# Patient Record
Sex: Male | Born: 1955 | Race: Black or African American | Hispanic: No | Smoking: Current every day smoker
Health system: Southern US, Community
[De-identification: ages and names within clinical notes are randomized; demographics above are authoritative.]

## PROBLEM LIST (undated history)

## (undated) DIAGNOSIS — R51 Headache: Secondary | ICD-10-CM

## (undated) DIAGNOSIS — F419 Anxiety disorder, unspecified: Secondary | ICD-10-CM

## (undated) DIAGNOSIS — F329 Major depressive disorder, single episode, unspecified: Secondary | ICD-10-CM

## (undated) DIAGNOSIS — F32A Depression, unspecified: Secondary | ICD-10-CM

## (undated) DIAGNOSIS — R0602 Shortness of breath: Secondary | ICD-10-CM

## (undated) DIAGNOSIS — I639 Cerebral infarction, unspecified: Secondary | ICD-10-CM

## (undated) DIAGNOSIS — I10 Essential (primary) hypertension: Secondary | ICD-10-CM

## (undated) DIAGNOSIS — I509 Heart failure, unspecified: Secondary | ICD-10-CM

## (undated) DIAGNOSIS — J449 Chronic obstructive pulmonary disease, unspecified: Secondary | ICD-10-CM

## (undated) HISTORY — PX: EYE SURGERY: SHX253

---

## 2009-02-09 ENCOUNTER — Emergency Department (HOSPITAL_COMMUNITY): Admission: EM | Admit: 2009-02-09 | Discharge: 2009-02-09 | Payer: Self-pay | Admitting: Emergency Medicine

## 2009-03-15 ENCOUNTER — Emergency Department (HOSPITAL_COMMUNITY): Admission: EM | Admit: 2009-03-15 | Discharge: 2009-03-15 | Payer: Self-pay | Admitting: Emergency Medicine

## 2009-07-06 ENCOUNTER — Encounter: Payer: Self-pay | Admitting: Internal Medicine

## 2009-07-30 ENCOUNTER — Ambulatory Visit: Payer: Self-pay | Admitting: Internal Medicine

## 2009-07-30 DIAGNOSIS — I10 Essential (primary) hypertension: Secondary | ICD-10-CM | POA: Insufficient documentation

## 2009-07-30 DIAGNOSIS — E785 Hyperlipidemia, unspecified: Secondary | ICD-10-CM | POA: Insufficient documentation

## 2009-07-30 DIAGNOSIS — I6350 Cerebral infarction due to unspecified occlusion or stenosis of unspecified cerebral artery: Secondary | ICD-10-CM | POA: Insufficient documentation

## 2009-07-30 DIAGNOSIS — I509 Heart failure, unspecified: Secondary | ICD-10-CM | POA: Insufficient documentation

## 2009-07-30 DIAGNOSIS — R0602 Shortness of breath: Secondary | ICD-10-CM

## 2009-07-30 DIAGNOSIS — J309 Allergic rhinitis, unspecified: Secondary | ICD-10-CM | POA: Insufficient documentation

## 2009-07-30 DIAGNOSIS — E119 Type 2 diabetes mellitus without complications: Secondary | ICD-10-CM

## 2009-08-13 ENCOUNTER — Ambulatory Visit: Payer: Self-pay | Admitting: Pulmonary Disease

## 2009-08-13 DIAGNOSIS — J438 Other emphysema: Secondary | ICD-10-CM

## 2011-01-18 LAB — POCT I-STAT, CHEM 8
BUN: 20 mg/dL (ref 6–23)
Chloride: 105 mEq/L (ref 96–112)
Glucose, Bld: 104 mg/dL — ABNORMAL HIGH (ref 70–99)
HCT: 47 % (ref 39.0–52.0)
Potassium: 3.7 mEq/L (ref 3.5–5.1)

## 2011-01-18 LAB — URINALYSIS, ROUTINE W REFLEX MICROSCOPIC
Ketones, ur: NEGATIVE mg/dL
Nitrite: NEGATIVE
pH: 5 (ref 5.0–8.0)

## 2011-01-18 LAB — URINE MICROSCOPIC-ADD ON

## 2011-01-18 LAB — PROTIME-INR
INR: 1.8 — ABNORMAL HIGH (ref 0.00–1.49)
Prothrombin Time: 22.2 seconds — ABNORMAL HIGH (ref 11.6–15.2)

## 2011-08-03 ENCOUNTER — Other Ambulatory Visit: Payer: Self-pay | Admitting: Family Medicine

## 2011-08-03 ENCOUNTER — Ambulatory Visit
Admission: RE | Admit: 2011-08-03 | Discharge: 2011-08-03 | Disposition: A | Discharge status: Home / Self Care | Payer: Medicare Other | Source: Ambulatory Visit | Attending: Family Medicine | Admitting: Family Medicine

## 2011-08-03 DIAGNOSIS — R05 Cough: Secondary | ICD-10-CM

## 2011-09-14 ENCOUNTER — Other Ambulatory Visit: Payer: Self-pay

## 2011-09-14 ENCOUNTER — Emergency Department (HOSPITAL_COMMUNITY): Payer: Medicare Other

## 2011-09-14 ENCOUNTER — Inpatient Hospital Stay (HOSPITAL_COMMUNITY)
Admission: EM | Admit: 2011-09-14 | Discharge: 2011-09-19 | DRG: 291 | Disposition: A | Discharge status: Home Health | Payer: Medicare Other | Attending: Internal Medicine | Admitting: Internal Medicine

## 2011-09-14 ENCOUNTER — Encounter: Payer: Self-pay | Admitting: *Deleted

## 2011-09-14 DIAGNOSIS — G4733 Obstructive sleep apnea (adult) (pediatric): Secondary | ICD-10-CM | POA: Diagnosis present

## 2011-09-14 DIAGNOSIS — Z720 Tobacco use: Secondary | ICD-10-CM | POA: Diagnosis present

## 2011-09-14 DIAGNOSIS — Z6841 Body Mass Index (BMI) 40.0 and over, adult: Secondary | ICD-10-CM

## 2011-09-14 DIAGNOSIS — J9600 Acute respiratory failure, unspecified whether with hypoxia or hypercapnia: Secondary | ICD-10-CM | POA: Diagnosis present

## 2011-09-14 DIAGNOSIS — E785 Hyperlipidemia, unspecified: Secondary | ICD-10-CM | POA: Diagnosis present

## 2011-09-14 DIAGNOSIS — Z7901 Long term (current) use of anticoagulants: Secondary | ICD-10-CM

## 2011-09-14 DIAGNOSIS — I509 Heart failure, unspecified: Secondary | ICD-10-CM | POA: Diagnosis present

## 2011-09-14 DIAGNOSIS — Z8673 Personal history of transient ischemic attack (TIA), and cerebral infarction without residual deficits: Secondary | ICD-10-CM

## 2011-09-14 DIAGNOSIS — Z79899 Other long term (current) drug therapy: Secondary | ICD-10-CM

## 2011-09-14 DIAGNOSIS — Z86711 Personal history of pulmonary embolism: Secondary | ICD-10-CM

## 2011-09-14 DIAGNOSIS — J9620 Acute and chronic respiratory failure, unspecified whether with hypoxia or hypercapnia: Secondary | ICD-10-CM | POA: Diagnosis present

## 2011-09-14 DIAGNOSIS — M109 Gout, unspecified: Secondary | ICD-10-CM | POA: Diagnosis present

## 2011-09-14 DIAGNOSIS — J209 Acute bronchitis, unspecified: Secondary | ICD-10-CM | POA: Diagnosis present

## 2011-09-14 DIAGNOSIS — I10 Essential (primary) hypertension: Secondary | ICD-10-CM | POA: Diagnosis present

## 2011-09-14 DIAGNOSIS — G4700 Insomnia, unspecified: Secondary | ICD-10-CM

## 2011-09-14 DIAGNOSIS — J9690 Respiratory failure, unspecified, unspecified whether with hypoxia or hypercapnia: Secondary | ICD-10-CM

## 2011-09-14 DIAGNOSIS — J449 Chronic obstructive pulmonary disease, unspecified: Secondary | ICD-10-CM

## 2011-09-14 DIAGNOSIS — I5043 Acute on chronic combined systolic (congestive) and diastolic (congestive) heart failure: Principal | ICD-10-CM | POA: Diagnosis present

## 2011-09-14 DIAGNOSIS — E0781 Sick-euthyroid syndrome: Secondary | ICD-10-CM | POA: Diagnosis present

## 2011-09-14 DIAGNOSIS — J440 Chronic obstructive pulmonary disease with acute lower respiratory infection: Secondary | ICD-10-CM | POA: Diagnosis present

## 2011-09-14 DIAGNOSIS — J441 Chronic obstructive pulmonary disease with (acute) exacerbation: Secondary | ICD-10-CM | POA: Diagnosis present

## 2011-09-14 DIAGNOSIS — F17200 Nicotine dependence, unspecified, uncomplicated: Secondary | ICD-10-CM | POA: Diagnosis present

## 2011-09-14 DIAGNOSIS — F341 Dysthymic disorder: Secondary | ICD-10-CM | POA: Diagnosis present

## 2011-09-14 DIAGNOSIS — I8290 Acute embolism and thrombosis of unspecified vein: Secondary | ICD-10-CM | POA: Diagnosis present

## 2011-09-14 DIAGNOSIS — E119 Type 2 diabetes mellitus without complications: Secondary | ICD-10-CM | POA: Diagnosis present

## 2011-09-14 HISTORY — DX: Cerebral infarction, unspecified: I63.9

## 2011-09-14 HISTORY — DX: Shortness of breath: R06.02

## 2011-09-14 HISTORY — DX: Depression, unspecified: F32.A

## 2011-09-14 HISTORY — DX: Chronic obstructive pulmonary disease, unspecified: J44.9

## 2011-09-14 HISTORY — DX: Heart failure, unspecified: I50.9

## 2011-09-14 HISTORY — DX: Anxiety disorder, unspecified: F41.9

## 2011-09-14 HISTORY — DX: Essential (primary) hypertension: I10

## 2011-09-14 HISTORY — DX: Headache: R51

## 2011-09-14 HISTORY — DX: Major depressive disorder, single episode, unspecified: F32.9

## 2011-09-14 LAB — DIFFERENTIAL
Eosinophils Relative: 0 % (ref 0–5)
Lymphocytes Relative: 17 % (ref 12–46)
Lymphs Abs: 1.9 10*3/uL (ref 0.7–4.0)
Monocytes Absolute: 1.3 10*3/uL — ABNORMAL HIGH (ref 0.1–1.0)
Monocytes Relative: 12 % (ref 3–12)

## 2011-09-14 LAB — CBC
HCT: 40.8 % (ref 39.0–52.0)
MCV: 84.1 fL (ref 78.0–100.0)
RBC: 4.85 MIL/uL (ref 4.22–5.81)
WBC: 11.2 10*3/uL — ABNORMAL HIGH (ref 4.0–10.5)

## 2011-09-14 LAB — POCT I-STAT 3, ART BLOOD GAS (G3+)
Acid-Base Excess: 12 mmol/L — ABNORMAL HIGH (ref 0.0–2.0)
Bicarbonate: 41.6 mEq/L — ABNORMAL HIGH (ref 20.0–24.0)
Bicarbonate: 43 mEq/L — ABNORMAL HIGH (ref 20.0–24.0)
Bicarbonate: 42.9 mEq/L — ABNORMAL HIGH (ref 20.0–24.0)
O2 Saturation: 99 %
O2 Saturation: 98 %
Patient temperature: 98.6
TCO2: 44 mmol/L (ref 0–100)
TCO2: 46 mmol/L (ref 0–100)
TCO2: 46 mmol/L (ref 0–100)
pCO2 arterial: 79.5 mmHg (ref 35.0–45.0)
pCO2 arterial: 90.4 mmHg (ref 35.0–45.0)
pH, Arterial: 7.327 — ABNORMAL LOW (ref 7.350–7.450)
pH, Arterial: 7.285 — ABNORMAL LOW (ref 7.350–7.450)
pO2, Arterial: 174 mmHg — ABNORMAL HIGH (ref 80.0–100.0)

## 2011-09-14 LAB — BASIC METABOLIC PANEL
CO2: 35 mEq/L — ABNORMAL HIGH (ref 19–32)
Chloride: 98 mEq/L (ref 96–112)
Sodium: 141 mEq/L (ref 135–145)

## 2011-09-14 LAB — POCT I-STAT TROPONIN I: Troponin i, poc: 0.13 ng/mL (ref 0.00–0.08)

## 2011-09-14 MED ORDER — MOXIFLOXACIN HCL IN NACL 400 MG/250ML IV SOLN
400.0000 mg | Freq: Once | INTRAVENOUS | Status: AC
  Administered 2011-09-14: 400 mg via INTRAVENOUS
  Filled 2011-09-14: qty 250

## 2011-09-14 MED ORDER — IPRATROPIUM BROMIDE 0.02 % IN SOLN
0.5000 mg | Freq: Once | RESPIRATORY_TRACT | Status: AC
  Administered 2011-09-14: 0.5 mg via RESPIRATORY_TRACT
  Filled 2011-09-14: qty 2.5

## 2011-09-14 MED ORDER — METHYLPREDNISOLONE SODIUM SUCC 125 MG IJ SOLR
125.0000 mg | Freq: Once | INTRAMUSCULAR | Status: AC
  Administered 2011-09-14: 125 mg via INTRAVENOUS
  Filled 2011-09-14: qty 2

## 2011-09-14 MED ORDER — ALBUTEROL SULFATE (5 MG/ML) 0.5% IN NEBU
5.0000 mg | INHALATION_SOLUTION | Freq: Once | RESPIRATORY_TRACT | Status: AC
  Administered 2011-09-14: 5 mg via RESPIRATORY_TRACT
  Filled 2011-09-14: qty 1

## 2011-09-14 MED ORDER — ENOXAPARIN SODIUM 150 MG/ML ~~LOC~~ SOLN
1.0000 mg/kg | SUBCUTANEOUS | Status: AC
  Administered 2011-09-14: 150 mg via SUBCUTANEOUS
  Filled 2011-09-14: qty 1

## 2011-09-14 NOTE — H&P (Signed)
Todd Sullivan is an 55 y.o. male.   Chief Complaint: Shortness of Breath HPI: 55 yo morbidly obese man with history of CHF and Diabetes here with progressive SOB, lower extremity edema for weeks. He has been having cough no fever. Still smokes cigarette but trying to quit. In ED, he was found to be hypoxic requiring Bipap. Has been taking his medications without missing any.  Past Medical History  Diagnosis Date  . CHF (congestive heart failure)   . Diabetes mellitus   . Asthma   . Gout     History reviewed. No pertinent past surgical history.  No family history on file. Social History:  reports that he has quit smoking. He does not have any smokeless tobacco history on file. He reports that he does not drink alcohol or use illicit drugs.  Allergies: No Known Allergies  Medications Prior to Admission  Medication Dose Route Frequency Provider Last Rate Last Dose  . albuterol (PROVENTIL) (5 MG/ML) 0.5% nebulizer solution 5 mg  5 mg Nebulization Once Joya Gaskins, MD   5 mg at 09/14/11 1929  . albuterol (PROVENTIL) (5 MG/ML) 0.5% nebulizer solution 5 mg  5 mg Nebulization Once Joya Gaskins, MD   5 mg at 09/14/11 2132  . enoxaparin (LOVENOX) injection 150 mg  1 mg/kg Subcutaneous To Major Fayne Norrie, PHARMD      . ipratropium (ATROVENT) nebulizer solution 0.5 mg  0.5 mg Nebulization Once Joya Gaskins, MD   0.5 mg at 09/14/11 1929  . methylPREDNISolone sodium succinate (SOLU-MEDROL) 125 MG injection 125 mg  125 mg Intravenous Once Joya Gaskins, MD   125 mg at 09/14/11 1942  . moxifloxacin (AVELOX) IVPB 400 mg  400 mg Intravenous Once Joya Gaskins, MD   400 mg at 09/14/11 1941   No current outpatient prescriptions on file as of 09/14/2011.    Results for orders placed during the hospital encounter of 09/14/11 (from the past 48 hour(s))  PRO B NATRIURETIC PEPTIDE     Status: Abnormal   Collection Time   09/14/11  6:34 PM      Component Value Range  Comment   BNP, POC 11455.0 (*) 0 - 125 (pg/mL)   POCT I-STAT 3, BLOOD GAS (G3+)     Status: Abnormal   Collection Time   09/14/11  6:34 PM      Component Value Range Comment   pH, Arterial 7.327 (*) 7.350 - 7.450     pCO2 arterial 79.5 (*) 35.0 - 45.0 (mmHg)    pO2, Arterial 87.0  80.0 - 100.0 (mmHg)    Bicarbonate 41.6 (*) 20.0 - 24.0 (mEq/L)    TCO2 44  0 - 100 (mmol/L)    O2 Saturation 95.0      Acid-Base Excess 12.0 (*) 0.0 - 2.0 (mmol/L)    Collection site RADIAL, ALLEN'S TEST ACCEPTABLE      Drawn by Operator      Sample type ARTERIAL      Comment NOTIFIED PHYSICIAN     BASIC METABOLIC PANEL     Status: Abnormal   Collection Time   09/14/11  6:40 PM      Component Value Range Comment   Sodium 141  135 - 145 (mEq/L)    Potassium 4.2  3.5 - 5.1 (mEq/L)    Chloride 98  96 - 112 (mEq/L)    CO2 35 (*) 19 - 32 (mEq/L)    Glucose, Bld 108 (*) 70 - 99 (mg/dL)  BUN 19  6 - 23 (mg/dL)    Creatinine, Ser 1.61  0.50 - 1.35 (mg/dL)    Calcium 9.1  8.4 - 10.5 (mg/dL)    GFR calc non Af Amer 83 (*) >90 (mL/min)    GFR calc Af Amer >90  >90 (mL/min)   CBC     Status: Abnormal   Collection Time   09/14/11  6:40 PM      Component Value Range Comment   WBC 11.2 (*) 4.0 - 10.5 (K/uL)    RBC 4.85  4.22 - 5.81 (MIL/uL)    Hemoglobin 12.2 (*) 13.0 - 17.0 (g/dL)    HCT 09.6  04.5 - 40.9 (%)    MCV 84.1  78.0 - 100.0 (fL)    MCH 25.2 (*) 26.0 - 34.0 (pg)    MCHC 29.9 (*) 30.0 - 36.0 (g/dL)    RDW 81.1  91.4 - 78.2 (%)    Platelets 374  150 - 400 (K/uL)   DIFFERENTIAL     Status: Abnormal   Collection Time   09/14/11  6:40 PM      Component Value Range Comment   Neutrophils Relative 70  43 - 77 (%)    Neutro Abs 7.9 (*) 1.7 - 7.7 (K/uL)    Lymphocytes Relative 17  12 - 46 (%)    Lymphs Abs 1.9  0.7 - 4.0 (K/uL)    Monocytes Relative 12  3 - 12 (%)    Monocytes Absolute 1.3 (*) 0.1 - 1.0 (K/uL)    Eosinophils Relative 0  0 - 5 (%)    Eosinophils Absolute 0.0  0.0 - 0.7 (K/uL)     Basophils Relative 0  0 - 1 (%)    Basophils Absolute 0.0  0.0 - 0.1 (K/uL)   PROTIME-INR     Status: Abnormal   Collection Time   09/14/11  6:40 PM      Component Value Range Comment   Prothrombin Time 18.2 (*) 11.6 - 15.2 (seconds)    INR 1.48  0.00 - 1.49    POCT I-STAT TROPONIN I     Status: Abnormal   Collection Time   09/14/11  6:58 PM      Component Value Range Comment   Troponin i, poc 0.13 (*) 0.00 - 0.08 (ng/mL)    Comment NOTIFIED PHYSICIAN      Comment 3            POCT I-STAT 3, BLOOD GAS (G3+)     Status: Abnormal   Collection Time   09/14/11  8:05 PM      Component Value Range Comment   pH, Arterial 7.285 (*) 7.350 - 7.450     pCO2 arterial 90.4 (*) 35.0 - 45.0 (mmHg)    pO2, Arterial 174.0 (*) 80.0 - 100.0 (mmHg)    Bicarbonate 43.0 (*) 20.0 - 24.0 (mEq/L)    TCO2 46  0 - 100 (mmol/L)    O2 Saturation 99.0      Acid-Base Excess 12.0 (*) 0.0 - 2.0 (mmol/L)    Patient temperature 98.6 F      Collection site RADIAL, ALLEN'S TEST ACCEPTABLE      Drawn by Operator      Sample type ARTERIAL      Comment NOTIFIED PHYSICIAN     POCT I-STAT 3, BLOOD GAS (G3+)     Status: Abnormal   Collection Time   09/14/11  8:40 PM      Component Value Range Comment  pH, Arterial 7.285 (*) 7.350 - 7.450     pCO2 arterial 90.3 (*) 35.0 - 45.0 (mmHg)    pO2, Arterial 135.0 (*) 80.0 - 100.0 (mmHg)    Bicarbonate 42.9 (*) 20.0 - 24.0 (mEq/L)    TCO2 46  0 - 100 (mmol/L)    O2 Saturation 98.0      Acid-Base Excess 12.0 (*) 0.0 - 2.0 (mmol/L)    Patient temperature 98.6 F      Collection site RADIAL, ALLEN'S TEST ACCEPTABLE      Drawn by Operator      Sample type ARTERIAL      Comment NOTIFIED PHYSICIAN      Dg Chest Port 1 View  09/14/2011  *RADIOLOGY REPORT*  Clinical Data: Shortness of breath.  PORTABLE CHEST - 1 VIEW  Comparison: Chest x-ray 08/03/2011.  Findings: The heart is enlarged but stable.  There is a central vascular congestion but no overt pulmonary edema.  No definite  pleural effusions or focal infiltrates.  The bony thorax is intact.  IMPRESSION: Stable cardiac enlargement and chronic central vascular congestion.  Original Report Authenticated By: P. Loralie Champagne, M.D.    Review of Systems  Constitutional: Positive for diaphoresis. Negative for fever and chills.  HENT: Negative.   Eyes: Negative.   Respiratory: Positive for cough, sputum production, shortness of breath and wheezing.   Cardiovascular: Positive for palpitations and orthopnea.  Gastrointestinal: Negative.   Genitourinary: Negative.   Musculoskeletal: Negative.   Skin: Negative.   Neurological: Negative.   Endo/Heme/Allergies: Negative.   Psychiatric/Behavioral: Negative.     Blood pressure 109/77, pulse 108, temperature 97.8 F (36.6 C), temperature source Axillary, resp. rate 23, height 6' (1.829 m), weight 156.945 kg (346 lb), SpO2 97.00%. Physical Exam  Constitutional: He is oriented to person, place, and time. He appears well-developed. He appears distressed.       Morbidly obese  HENT:  Head: Normocephalic and atraumatic.  Right Ear: External ear normal.  Left Ear: External ear normal.  Eyes: Conjunctivae and EOM are normal. Pupils are equal, round, and reactive to light.  Neck: Normal range of motion. Neck supple.  Cardiovascular: S1 normal, S2 normal and intact distal pulses.  Tachycardia present.   Respiratory: He is in respiratory distress. He has wheezes. He has rales. He exhibits no tenderness.  GI: Soft. Bowel sounds are normal.  Musculoskeletal: Normal range of motion.  Neurological: He is alert and oriented to person, place, and time. He has normal reflexes.  Skin: Skin is warm. He is diaphoretic.  Psychiatric: He has a normal mood and affect.     Assessment/Plan 1.  Acute Respiratory failure: Seems like COPD exacerbation with some CHF component. Will Keep on Bipap, Nebulizers, Steroids and antibiotic. 2. COPD exacerbation: As above 3. CHF: High BNP but no  CXR evidence of Congestion. Will continue Diuresis, Saline lock, 2D echo, Continue ACEI. 4. DM2: Continue home meds and SSI 5. Morbid Obesity  GARBA,LAWAL 09/14/2011, 10:41 PM

## 2011-09-14 NOTE — Progress Notes (Signed)
ANTICOAGULATION CONSULT NOTE - Initial Consult  Pharmacy Consult for Lovenox/Coumadin Indication: history of PE and history of CVA  No Known Allergies  Patient Measurements: Height: 6' (182.9 cm) (Reported from patient) Weight: 346 lb (156.945 kg) (Reported from Patient) IBW/kg (Calculated) : 77.6   Vital Signs: Temp: 97.8 F (36.6 C) (12/05 1801) Temp src: Axillary (12/05 1801) BP: 162/112 mmHg (12/05 1931) Pulse Rate: 116  (12/05 1931)  Labs:  Basename 09/14/11 1840  HGB 12.2*  HCT 40.8  PLT 374  APTT --  LABPROT 18.2*  INR 1.48  HEPARINUNFRC --  CREATININE 1.00  CKTOTAL --  CKMB --  TROPONINI --   Estimated Creatinine Clearance: 130.6 ml/min (by C-G formula based on Cr of 1).  Medical History: Past Medical History  Diagnosis Date  . CHF (congestive heart failure)   . Diabetes mellitus   . Asthma   . Gout     Medications:  Patient was on Coumadin prior to admission. Per patient has not had PT/INR in a while and was taking 5mg  (has not been taking 7.5mg --although difficult to hear him with mask).   Assessment: 55 year old male admitted with SOB on breathing mask with much work of breathing. Patient has history of a PE (unsure of date) and had 2 CVAs in 2006. Spoke with Dr. Mikeal Hawthorne-- wishes to start Lovenox per pharmacy and hold off on Coumadin dose tonight 2/2 to patients increased work of breathing.  INR 1.48 (goal 2-3). CBC wnl.   Goal of Therapy:  INR 2-3   Plan:  1. No Coumadin tonight per Dr. Mikeal Hawthorne --will follow up in AM with team to determine if ok to resume. 2. Lovenox 150mg  SQ q12h- 1st dose now.   Fayne Norrie 09/14/2011,9:47 PM

## 2011-09-14 NOTE — ED Notes (Signed)
Pt undressed and placed in gown. Pt placed on cardiac monitor, bp cuff, and pulse ox.  

## 2011-09-14 NOTE — Progress Notes (Signed)
Pt checked on frequently He is awake/alert CO2 has stabilized at 90, but he is awake/alert, tolerating bipap Awaiting admission

## 2011-09-14 NOTE — ED Notes (Signed)
[phone] Shin Lamour  (sister)

## 2011-09-14 NOTE — ED Provider Notes (Signed)
History     CSN: [id number] Arrival date & time: 09/14/2011  5:56 PM   First MD Initiated Contact with Patient 09/14/11 1812      Chief Complaint  Patient presents with  . Joint Swelling     Patient is a 55 y.o. male presenting with shortness of breath. The history is provided by the patient and a relative.  Shortness of Breath  Episode onset: a brief time ago. The onset was gradual. The problem occurs continuously. The problem has been rapidly worsening. The problem is severe. The symptoms are relieved by nothing. The symptoms are aggravated by nothing. Associated symptoms include shortness of breath.  Pt here for LE swelling and shortness of breath Full history not performed as pt with severe SOB  Past Medical History  Diagnosis Date  . CHF (congestive heart failure)   . Diabetes mellitus   . Asthma   . Gout     History reviewed. No pertinent past surgical history.  No family history on file.  History  Substance Use Topics  . Smoking status: Not on file  . Smokeless tobacco: Not on file  . Alcohol Use:       Review of Systems  Unable to perform ROS: Unstable vital signs  Respiratory: Positive for shortness of breath.     Allergies  Review of patient's allergies indicates no known allergies.  Home Medications   Current Outpatient Rx  Name Route Sig Dispense Refill  . ALBUTEROL SULFATE HFA 108 (90 BASE) MCG/ACT IN AERS Inhalation Inhale 2 puffs into the lungs every 4 (four) hours as needed. For shortness of breath.     . AMLODIPINE-OLMESARTAN 10-40 MG PO TABS Oral Take 1 tablet by mouth daily.      . BUDESONIDE-FORMOTEROL FUMARATE 160-4.5 MCG/ACT IN AERO Inhalation Inhale 2 puffs into the lungs 2 (two) times daily.      Marland Kitchen CLONIDINE HCL 0.2 MG PO TABS Oral Take 0.2 mg by mouth 2 (two) times daily.      . FUROSEMIDE 40 MG PO TABS Oral Take 40 mg by mouth daily.      . IPRATROPIUM-ALBUTEROL 0.5-2.5 (3) MG/3ML IN SOLN Nebulization Take 3 mLs by nebulization every  6 (six) hours as needed. For shortness of breath.     Marland Kitchen LISINOPRIL 40 MG PO TABS Oral Take 40 mg by mouth daily.      Marland Kitchen PRAVASTATIN SODIUM 20 MG PO TABS Oral Take 20 mg by mouth daily.      . WARFARIN SODIUM 5 MG PO TABS Oral Take 5-7.5 mg by mouth daily. Alternate between 5 MG and 7.5 MG.       BP 162/112  Pulse 122  Temp(Src) 97.8 F (36.6 C) (Axillary)  Resp 30  SpO2 100%  Physical Exam Pt tachypneic/tachycardic CONSTITUTIONAL: Well developed/well nourished, anxious appearing HEAD AND FACE: Normocephalic/atraumatic EYES: EOMI/PERRL ENMT: Mucous membranes moist NECK: supple no meningeal signs CV: tachycardia, no loud murmurs LUNGS: tachypnea, wheezing noted bilaterally, pt able to speak in short sentences ABDOMEN: soft, nontender, no rebound or guarding NEURO: Pt is awake/alert, moves all extremitiesx4 EXTREMITIES: pulses normal, full ROM, bilateral LE edema noted  SKIN: warm, color normal PSYCH: no abnormalities of mood noted   ED Course  Procedures   CRITICAL CARE Performed by: Joya Gaskins   Total critical care time: 40  Critical care time was exclusive of separately billable procedures and treating other patients.  Critical care was necessary to treat or prevent imminent or life-threatening deterioration.  Critical  care was time spent personally by me on the following activities: development of treatment plan with patient and/or surrogate as well as nursing, discussions with consultants, evaluation of patient's response to treatment, examination of patient, obtaining history from patient or surrogate, ordering and performing treatments and interventions, ordering and review of laboratory studies, ordering and review of radiographic studies, pulse oximetry and re-evaluation of patient's condition.   Labs Reviewed  BASIC METABOLIC PANEL  CBC  DIFFERENTIAL  I-STAT TROPONIN I  PRO B NATRIURETIC PEPTIDE  PROTIME-INR   6:22 PM Pt tachypneic, COPD vs CHF will  start bipap and follow closely  7:03 PM Pt improved on bipap ABG reviewed CXR reviewed Suspect more likely COPD at this point  7:10 PM D/w dr Marchelle Gearing, he feels safe for medicine admit  8:03 PM D/w dr Mikeal Hawthorne, will admit  Pt awake/alert, tolerating bipap Troponin elevated likely due to HR  MDM  Nursing notes reviewed and considered in documentation All labs/vitals reviewed and considered xrays reviewed and considered       Date: 09/14/2011  Rate: 123   Rhythm: sinus tachycardia  QRS Axis: normal  Intervals: normal  ST/T Wave abnormalities: nonspecific ST changes  Conduction Disutrbances:none  Narrative Interpretation:   Old EKG Reviewed: changes noted    Joya Gaskins, MD 09/14/11 2004

## 2011-09-14 NOTE — ED Notes (Signed)
Pt reports bilateral leg edema x 1 day. Pt did not take lasix today, out of lasix prescription. Pt also reports shortness of breath with exertion. Denies pain. Lung fields clear per EMS.

## 2011-09-14 NOTE — ED Notes (Signed)
1st attempt to call report unsuccessful 

## 2011-09-14 NOTE — ED Notes (Signed)
Pt states that in 2007 he had 2 strokes and MD placed on warfarin and he has been on medication ever since

## 2011-09-15 ENCOUNTER — Encounter (HOSPITAL_COMMUNITY): Payer: Self-pay | Admitting: Certified Registered Nurse Anesthetist

## 2011-09-15 DIAGNOSIS — I5043 Acute on chronic combined systolic (congestive) and diastolic (congestive) heart failure: Secondary | ICD-10-CM | POA: Diagnosis present

## 2011-09-15 DIAGNOSIS — M109 Gout, unspecified: Secondary | ICD-10-CM | POA: Diagnosis present

## 2011-09-15 DIAGNOSIS — I8290 Acute embolism and thrombosis of unspecified vein: Secondary | ICD-10-CM | POA: Diagnosis present

## 2011-09-15 DIAGNOSIS — Z720 Tobacco use: Secondary | ICD-10-CM | POA: Diagnosis present

## 2011-09-15 LAB — POCT I-STAT 3, ART BLOOD GAS (G3+)
Bicarbonate: 39.7 mEq/L — ABNORMAL HIGH (ref 20.0–24.0)
O2 Saturation: 88 %
Patient temperature: 98.7
TCO2: 42 mmol/L (ref 0–100)

## 2011-09-15 LAB — CBC
HCT: 39.6 % (ref 39.0–52.0)
MCH: 24.5 pg — ABNORMAL LOW (ref 26.0–34.0)
MCHC: 29 g/dL — ABNORMAL LOW (ref 30.0–36.0)
RDW: 15.2 % (ref 11.5–15.5)

## 2011-09-15 LAB — CARDIAC PANEL(CRET KIN+CKTOT+MB+TROPI)
CK, MB: 9 ng/mL (ref 0.3–4.0)
CK, MB: 9.3 ng/mL (ref 0.3–4.0)
Total CK: 488 U/L — ABNORMAL HIGH (ref 7–232)
Troponin I: <0.3 ng/mL (ref <0.30)

## 2011-09-15 LAB — COMPREHENSIVE METABOLIC PANEL
ALT: 28 U/L (ref 0–53)
AST: 30 U/L (ref 0–37)
Alkaline Phosphatase: 74 U/L (ref 39–117)
CO2: 38 mEq/L — ABNORMAL HIGH (ref 19–32)
GFR calc Af Amer: >90 mL/min (ref >90)
GFR calc non Af Amer: >90 mL/min (ref >90)
Glucose, Bld: 138 mg/dL — ABNORMAL HIGH (ref 70–99)
Potassium: 4.3 mEq/L (ref 3.5–5.1)
Sodium: 143 mEq/L (ref 135–145)
Total Protein: 6.7 g/dL (ref 6.0–8.3)

## 2011-09-15 LAB — PROTIME-INR: INR: 1.58 — ABNORMAL HIGH (ref 0.00–1.49)

## 2011-09-15 LAB — GLUCOSE, CAPILLARY: Glucose-Capillary: 170 mg/dL — ABNORMAL HIGH (ref 70–99)

## 2011-09-15 LAB — HEMOGLOBIN A1C: Hgb A1c MFr Bld: 6.9 % — ABNORMAL HIGH (ref <5.7)

## 2011-09-15 MED ORDER — LISINOPRIL 40 MG PO TABS
40.0000 mg | ORAL_TABLET | Freq: Every day | ORAL | Status: DC
  Administered 2011-09-15 – 2011-09-19 (×5): 40 mg via ORAL
  Filled 2011-09-15 (×5): qty 1

## 2011-09-15 MED ORDER — MOXIFLOXACIN HCL IN NACL 400 MG/250ML IV SOLN
400.0000 mg | INTRAVENOUS | Status: DC
  Administered 2011-09-15 – 2011-09-16 (×2): 400 mg via INTRAVENOUS
  Filled 2011-09-15 (×2): qty 250

## 2011-09-15 MED ORDER — IPRATROPIUM BROMIDE 0.02 % IN SOLN
0.5000 mg | Freq: Four times a day (QID) | RESPIRATORY_TRACT | Status: DC
  Administered 2011-09-15 – 2011-09-19 (×16): 0.5 mg via RESPIRATORY_TRACT
  Filled 2011-09-15 (×19): qty 2.5

## 2011-09-15 MED ORDER — ACETAMINOPHEN 650 MG RE SUPP: 650.0000 mg | Freq: Four times a day (QID) | RECTAL | Status: DC | PRN

## 2011-09-15 MED ORDER — ENOXAPARIN SODIUM 150 MG/ML ~~LOC~~ SOLN
150.0000 mg | Freq: Two times a day (BID) | SUBCUTANEOUS | Status: DC
  Administered 2011-09-15 – 2011-09-19 (×8): 150 mg via SUBCUTANEOUS
  Filled 2011-09-15 (×9): qty 1

## 2011-09-15 MED ORDER — LEVALBUTEROL HCL 0.63 MG/3ML IN NEBU
0.6300 mg | INHALATION_SOLUTION | Freq: Four times a day (QID) | RESPIRATORY_TRACT | Status: DC
  Administered 2011-09-15 – 2011-09-19 (×16): 0.63 mg via RESPIRATORY_TRACT
  Filled 2011-09-15 (×22): qty 3

## 2011-09-15 MED ORDER — FUROSEMIDE 40 MG PO TABS: 40.0000 mg | ORAL_TABLET | Freq: Three times a day (TID) | ORAL | Status: DC

## 2011-09-15 MED ORDER — OLMESARTAN MEDOXOMIL 40 MG PO TABS
40.0000 mg | ORAL_TABLET | Freq: Every day | ORAL | Status: DC
  Administered 2011-09-15 – 2011-09-19 (×5): 40 mg via ORAL
  Filled 2011-09-15 (×6): qty 1

## 2011-09-15 MED ORDER — AMLODIPINE BESYLATE 10 MG PO TABS
10.0000 mg | ORAL_TABLET | Freq: Every day | ORAL | Status: DC
  Administered 2011-09-15 – 2011-09-19 (×5): 10 mg via ORAL
  Filled 2011-09-15 (×5): qty 1

## 2011-09-15 MED ORDER — BIOTENE DRY MOUTH MT LIQD
15.0000 mL | Freq: Two times a day (BID) | OROMUCOSAL | Status: DC
  Administered 2011-09-15 – 2011-09-19 (×7): 15 mL via OROMUCOSAL

## 2011-09-15 MED ORDER — FUROSEMIDE 40 MG PO TABS
40.0000 mg | ORAL_TABLET | Freq: Every day | ORAL | Status: DC
  Administered 2011-09-15: 40 mg via ORAL
  Filled 2011-09-15: qty 1

## 2011-09-15 MED ORDER — ACETAMINOPHEN 325 MG PO TABS: 650.0000 mg | ORAL_TABLET | Freq: Four times a day (QID) | ORAL | Status: DC | PRN

## 2011-09-15 MED ORDER — PREDNISOLONE 5 MG PO TABS
40.0000 mg | ORAL_TABLET | Freq: Every day | ORAL | Status: DC
  Administered 2011-09-15 – 2011-09-16 (×2): 40 mg via ORAL
  Filled 2011-09-15 (×3): qty 8

## 2011-09-15 MED ORDER — SODIUM CHLORIDE 0.9 % IJ SOLN
3.0000 mL | Freq: Two times a day (BID) | INTRAMUSCULAR | Status: DC
  Administered 2011-09-15 – 2011-09-16 (×4): 3 mL via INTRAVENOUS
  Administered 2011-09-16: 10:00:00 via INTRAVENOUS
  Administered 2011-09-17 – 2011-09-19 (×4): 3 mL via INTRAVENOUS

## 2011-09-15 MED ORDER — CLONIDINE HCL 0.3 MG/24HR TD PTWK
0.3000 mg | MEDICATED_PATCH | TRANSDERMAL | Status: DC
  Administered 2011-09-15: 0.3 mg via TRANSDERMAL
  Filled 2011-09-15 (×2): qty 1

## 2011-09-15 MED ORDER — AMLODIPINE-OLMESARTAN 10-40 MG PO TABS: 1.0000 | ORAL_TABLET | Freq: Every day | ORAL | Status: DC

## 2011-09-15 MED ORDER — ONDANSETRON HCL 4 MG/2ML IJ SOLN
4.0000 mg | Freq: Four times a day (QID) | INTRAMUSCULAR | Status: DC | PRN
  Filled 2011-09-15: qty 2

## 2011-09-15 MED ORDER — FUROSEMIDE 10 MG/ML IJ SOLN
40.0000 mg | Freq: Three times a day (TID) | INTRAMUSCULAR | Status: DC
  Administered 2011-09-15 – 2011-09-17 (×6): 40 mg via INTRAVENOUS
  Filled 2011-09-15 (×9): qty 4

## 2011-09-15 MED ORDER — WARFARIN VIDEO
Freq: Once | Status: AC
  Administered 2011-09-15: 18:00:00

## 2011-09-15 MED ORDER — ONDANSETRON HCL 4 MG PO TABS: 4.0000 mg | ORAL_TABLET | Freq: Four times a day (QID) | ORAL | Status: DC | PRN

## 2011-09-15 MED ORDER — HYDRALAZINE HCL 20 MG/ML IJ SOLN
10.0000 mg | Freq: Four times a day (QID) | INTRAMUSCULAR | Status: DC | PRN
  Administered 2011-09-15: 10 mg via INTRAVENOUS
  Filled 2011-09-15: qty 1

## 2011-09-15 MED ORDER — METOPROLOL TARTRATE 12.5 MG HALF TABLET
12.5000 mg | ORAL_TABLET | Freq: Two times a day (BID) | ORAL | Status: DC
  Administered 2011-09-15 – 2011-09-19 (×8): 12.5 mg via ORAL
  Filled 2011-09-15 (×10): qty 1

## 2011-09-15 MED ORDER — DIGOXIN 250 MCG PO TABS
0.2500 mg | ORAL_TABLET | Freq: Every day | ORAL | Status: DC
  Administered 2011-09-15 – 2011-09-19 (×5): 0.25 mg via ORAL
  Filled 2011-09-15 (×5): qty 1

## 2011-09-15 MED ORDER — METHYLPREDNISOLONE SODIUM SUCC 125 MG IJ SOLR
125.0000 mg | Freq: Four times a day (QID) | INTRAMUSCULAR | Status: DC
  Administered 2011-09-15 (×2): 125 mg via INTRAVENOUS
  Filled 2011-09-15 (×5): qty 2

## 2011-09-15 MED ORDER — HYDROCODONE-ACETAMINOPHEN 5-325 MG PO TABS: 1.0000 | ORAL_TABLET | ORAL | Status: DC | PRN

## 2011-09-15 MED ORDER — ENOXAPARIN SODIUM 150 MG/ML ~~LOC~~ SOLN
150.0000 mg | Freq: Two times a day (BID) | SUBCUTANEOUS | Status: DC
  Administered 2011-09-15: 150 mg via SUBCUTANEOUS
  Filled 2011-09-15 (×2): qty 1

## 2011-09-15 MED ORDER — PATIENT'S GUIDE TO USING COUMADIN BOOK
Freq: Once | Status: AC
  Administered 2011-09-15: 18:00:00
  Filled 2011-09-15: qty 1

## 2011-09-15 MED ORDER — SIMVASTATIN 5 MG PO TABS
5.0000 mg | ORAL_TABLET | Freq: Every day | ORAL | Status: DC
  Administered 2011-09-15 – 2011-09-19 (×6): 5 mg via ORAL
  Filled 2011-09-15 (×5): qty 1

## 2011-09-15 MED ORDER — GUAIFENESIN ER 600 MG PO TB12
600.0000 mg | ORAL_TABLET | Freq: Two times a day (BID) | ORAL | Status: DC
  Administered 2011-09-15 – 2011-09-19 (×8): 600 mg via ORAL
  Filled 2011-09-15 (×11): qty 1

## 2011-09-15 MED ORDER — WARFARIN SODIUM 5 MG PO TABS
5.0000 mg | ORAL_TABLET | Freq: Once | ORAL | Status: AC
  Administered 2011-09-15: 5 mg via ORAL
  Filled 2011-09-15: qty 1

## 2011-09-15 MED ORDER — CLONIDINE HCL 0.2 MG PO TABS
0.2000 mg | ORAL_TABLET | Freq: Two times a day (BID) | ORAL | Status: DC
  Administered 2011-09-15 (×2): 0.2 mg via ORAL
  Filled 2011-09-15 (×3): qty 1

## 2011-09-15 MED ORDER — SODIUM CHLORIDE 0.9 % IV SOLN
250.0000 mL | INTRAVENOUS | Status: DC | PRN
  Administered 2011-09-15: 250 mL via INTRAVENOUS

## 2011-09-15 MED ORDER — SODIUM CHLORIDE 0.9 % IJ SOLN: 3.0000 mL | INTRAMUSCULAR | Status: DC | PRN

## 2011-09-15 MED ORDER — CHLORHEXIDINE GLUCONATE 0.12 % MT SOLN
15.0000 mL | Freq: Two times a day (BID) | OROMUCOSAL | Status: DC
  Administered 2011-09-15 – 2011-09-19 (×7): 15 mL via OROMUCOSAL
  Filled 2011-09-15 (×11): qty 15

## 2011-09-15 NOTE — Progress Notes (Signed)
Subjective: Shortness of breath is better. He has not been taking his Lasix as he does not like having to urinate so much. His ankles are less swollen since he has been here. He has had a cough w/ congestion as well.  He noticed swelling in his rt first toe lately.  He also admits to having clots in his leg in 2007. He states he has been taking his coumadin "almost" as ordered. He has not had an INR in a few months. Objective: Patient Vitals for the past 24 hrs:  BP Temp Temp src Pulse Resp SpO2 Height Weight  09/15/11 0912 - - - - - 96 % - -  09/15/11 0807 - 97.7 F (36.5 C) Oral - - - - -  09/15/11 0600 156/86 mmHg - - 95  31  100 % - -  09/15/11 0500 124/74 mmHg - - 92  27  97 % - -  09/15/11 0400 135/73 mmHg 97.7 F (36.5 C) Axillary 93  33  96 % - 159.7 kg (352 lb 1.2 oz)  09/15/11 0300 139/84 mmHg - - 94  30  96 % - -  09/15/11 0259 - - - - - 96 % - -  09/15/11 0255 139/84 mmHg - - 96  37  96 % - -  09/15/11 0230 147/92 mmHg - - 94  5  95 % - -  09/15/11 0200 155/94 mmHg - - 102  21  98 % - -  09/15/11 0145 165/107 mmHg - - 106  33  99 % - -  09/15/11 0132 166/106 mmHg - - - 24  99 % - -  09/15/11 0115 - - - 108  21  97 % - -  09/15/11 0100 - - - 109  23  98 % - -  09/15/11 0045 - - - 110  17  97 % - -  09/15/11 0030 - - - 106  0  97 % - -  09/15/11 0015 162/118 mmHg - - 109  28  99 % - -  09/15/11 0003 159/108 mmHg - - 108  - - - -  09/15/11 0000 159/108 mmHg 97.8 F (36.6 C) Axillary 108  27  97 % 6' (1.829 m) 160.1 kg (352 lb 15.3 oz)  09/14/11 2355 159/108 mmHg - - 110  37  98 % - -  09/14/11 2328 157/100 mmHg 98 F (36.7 C) Oral 110  23  92 % - -  09/14/11 2254 - - - - - 94 % - -  09/14/11 2140 109/77 mmHg - - 108  23  97 % 6' (1.829 m) 156.945 kg (346 lb)  09/14/11 2130 150/97 mmHg - - 109  - 99 % - -  09/14/11 2100 - - - 109  - 98 % - -  09/14/11 2030 - - - 112  - 98 % - -  09/14/11 2000 - - - 117  - 98 % - -  09/14/11 1931 162/112 mmHg - - 116  42  97 % - -    09/14/11 1930 - - - 117  - 99 % - -  09/14/11 1900 - - - 118  32  98 % - -  09/14/11 1830 - - - 118  27  100 % - -  09/14/11 1801 162/112 mmHg 97.8 F (36.6 C) Axillary 122  30  100 % - -   Weight change:   Intake/Output Summary (Last 24 hours) at 09/15/11 1116  Last data filed at 09/15/11 0300  Gross per 24 hour  Intake    400 ml  Output    370 ml  Net     30 ml    Physical Exam: General appearance: alert, cooperative and morbidly obese Eyes: conjunctivae/corneas clear. PERRL, EOM's intact. Fundi benign. Lungs: rhonchi bilaterally Heart: regular rate and rhythm, S1, S2 normal, no murmur Abdomen: obese, soft, non-tender; bowel sounds normal; no masses,  no organomegaly Extremities: extremities normal, atraumatic, no cyanosis or edema- had erythema, swelling and tenderness in base of left first toe. Neurologic: Grossly normal  Lab Results:  [facility] 09/15/11 0505 09/14/11 1840  NA 143 141  K 4.3 4.2  CL 98 98  CO2 38* 35*  GLUCOSE 138* 108*  BUN 18 19  CREATININE 0.88 1.00  CALCIUM 9.1 9.1  MG -- --  PHOS -- --    Basename 09/15/11 0505  AST 30  ALT 28  ALKPHOS 74  BILITOT 0.4  PROT 6.7  ALBUMIN 2.7*   No results found for this basename: LIPASE:2,AMYLASE:2 in the last 72 hours  Basename 09/15/11 0505 09/14/11 1840  WBC 9.8 11.2*  NEUTROABS -- 7.9*  HGB 11.5* 12.2*  HCT 39.6 40.8  MCV 84.4 84.1  PLT 348 374   No results found for this basename: CKTOTAL:3,CKMB:3,CKMBINDEX:3,TROPONINI:3 in the last 72 hours  Basename 09/14/11 1834  POCBNP 11455.0*   No results found for this basename: DDIMER:2 in the last 72 hours No results found for this basename: HGBA1C:2 in the last 72 hours No results found for this basename: CHOL:2,HDL:2,LDLCALC:2,TRIG:2,CHOLHDL:2,LDLDIRECT:2 in the last 72 hours No results found for this basename: TSH,T4TOTAL,FREET3,T3FREE,THYROIDAB in the last 72 hours No results found for this basename:  VITAMINB12:2,FOLATE:2,FERRITIN:2,TIBC:2,IRON:2,RETICCTPCT:2 in the last 72 hours  Micro Results: Recent Results (from the past 240 hour(s))  MRSA PCR SCREENING     Status: Normal   Collection Time   09/15/11 12:00 AM      Component Value Range Status Comment   MRSA by PCR NEGATIVE  NEGATIVE  Final     Studies/Results: Dg Chest Port 1 View  09/14/2011  *RADIOLOGY REPORT*  Clinical Data: Shortness of breath.  PORTABLE CHEST - 1 VIEW  Comparison: Chest x-ray 08/03/2011.  Findings: The heart is enlarged but stable.  There is a central vascular congestion but no overt pulmonary edema.  No definite pleural effusions or focal infiltrates.  The bony thorax is intact.  IMPRESSION: Stable cardiac enlargement and chronic central vascular congestion.  Original Report Authenticated By: P. Loralie Champagne, M.D.    Medications: Scheduled Meds:   . albuterol  5 mg Nebulization Once  . albuterol  5 mg Nebulization Once  . amLODipine  10 mg Oral Daily  . antiseptic oral rinse  15 mL Mouth Rinse q12n4p  . chlorhexidine  15 mL Mouth Rinse BID  . cloNIDine  0.2 mg Oral BID  . enoxaparin (LOVENOX) injection  1 mg/kg Subcutaneous To Major  . furosemide  40 mg Intravenous Q8H  . guaiFENesin  600 mg Oral BID  . ipratropium  0.5 mg Nebulization Once  . ipratropium  0.5 mg Nebulization Q6H  . levalbuterol  0.63 mg Nebulization Q6H  . lisinopril  40 mg Oral Daily  . methylPREDNISolone (SOLU-MEDROL) injection  125 mg Intravenous Once  . moxifloxacin  400 mg Intravenous Once  . moxifloxacin  400 mg Intravenous Q24H  . olmesartan  40 mg Oral Daily  . prednisoLONE  40 mg Oral Daily  . simvastatin  5  mg Oral q1800  . sodium chloride  3 mL Intravenous Q12H  . DISCONTD: amLODipine-olmesartan  1 tablet Oral Daily  . DISCONTD: enoxaparin (LOVENOX) injection  150 mg Subcutaneous Q12H  . DISCONTD: furosemide  40 mg Oral Daily  . DISCONTD: furosemide  40 mg Oral Q8H  . DISCONTD: methylPREDNISolone (SOLU-MEDROL)  injection  125 mg Intravenous Q6H   Continuous Infusions:  PRN Meds:.sodium chloride, acetaminophen, acetaminophen, hydrALAZINE, HYDROcodone-acetaminophen, ondansetron (ZOFRAN) IV, ondansetron, sodium chloride  Assessment/Plan: Principal Problem:  *Respiratory failure, acute on chronic -multifactorial.   -COPD exacerbation/ Acute bronchitis- No wheezing now. Stop solumedrol and start Prednisone 40 mg daily. Cont Nebs. Add Guaifenesin.  Avelox day 2.  Repeat ABG and ensure that O2 sats are not above 92% as he is retaining CO2.  Also, should have a sleep study later.  I will order Bipap for tonight.   - CHF- systolic- spoke w/ Dr Jacinto Halim as the patient was just at his office for a 2D ECHO a few days ago. EF is 25-30% , RV and both atria are dilated. CXR reveals a moderate left sided pleural effusion and vascular congestion. He does not take his Lasix. Will change to IV Lasix Q8hrs (currently 40 mg PO daily). Watch I and O and daily weights. Repeat BNP Prior in few days. Already on Lisinopril and Olmesartan (Azor) An ECHO was done here as well- results pending. Hopefully was a better qualitiy.  Would expect that he would need a cardiac cath to r/o ischemic cause for the cardiomyopathy.   Gout-  Steroids should be helping with this. May need Allopurinol once acute attack resolved.   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 On Full dose Lovenox until INR theraputic.  Nicotine Abuse- will request councilor speak with him.    DM Start Novolog sliding scale. Take Metformin at home. Check A1c and resume Metformin.    HYPERTENSION- Would increase Clonidine and change him over to a patch to aid in compliance. (has Medicare)  Morbid Obesity   LOS: 1 day   [facility] 09/15/2011, 11:16 AM

## 2011-09-15 NOTE — Progress Notes (Signed)
ANTICOAGULATION CONSULT NOTE - Initial Consult  Pharmacy Consult for Lovenox/Coumadin Indication: history of PE and history of CVA  No Known Allergies  Patient Measurements: Height: 6' (182.9 cm) Weight: 352 lb 1.2 oz (159.7 kg) IBW/kg (Calculated) : 77.6   Vital Signs: Temp: 98.4 F (36.9 C) (12/06 1204) Temp src: Oral (12/06 1204) BP: 172/118 mmHg (12/06 1100) Pulse Rate: 99  (12/06 1100)  Labs:  Basename 09/15/11 1259 09/15/11 0505 09/14/11 1840  HGB -- 11.5* 12.2*  HCT -- 39.6 40.8  PLT -- 348 374  APTT -- -- --  LABPROT -- 19.2* 18.2*  INR -- 1.58* 1.48  HEPARINUNFRC -- -- --  CREATININE -- 0.88 1.00  CKTOTAL 391* -- --  CKMB 9.0* -- --  TROPONINI <0.30 -- --   Estimated Creatinine Clearance: 149.8 ml/min (by C-G formula based on Cr of 0.88).  Medical History: Past Medical History  Diagnosis Date  . CHF (congestive heart failure)   . Diabetes mellitus   . Asthma   . Gout   . Shortness of breath   . Hypertension   . Stroke   . Headache   . Anxiety   . COPD (chronic obstructive pulmonary disease)   . Depression     Medications:  Patient was on Coumadin prior to admission. Per patient has not had PT/INR in a while and was taking 5mg  (per pt report).   Assessment: 55 year old male with history of a PE (unsure of date) and had 2 CVAs in 2006. INR 1.58; on full dose lovenox, coumadin to resume tonight (not given last PM due to increased WOB).  Goal of Therapy:  INR 2-3   Plan:  1. Coumadin 5 mg x1 tonight.  Will start coumadin re-education. 2. Continue Lovenox 150mg  SQ q12h. 3. Daily PT/INR. 4. CBC q72 hrs while on lovenox.  Demarion Pondexter C 09/15/2011,3:24 PM

## 2011-09-15 NOTE — Progress Notes (Signed)
  Echocardiogram 2D Echocardiogram with Definity imaging enhancement has been performed.  Dewitt Hoes, RDCS 09/15/2011, 4:26 PM

## 2011-09-15 NOTE — H&P (Signed)
Todd Sullivan is an 55 y.o. male.   Chief Complaint: Shortness of Breath HPI: 55 yo morbidly obese man with history of CHF and Diabetes here with progressive SOB, lower extremity edema for weeks. He has been having cough no fever. Still smokes cigarette but trying to quit. Smokes about 1/2 ppd.  In ED, he was found to be hypoxic requiring Bipap. Has been taking his medications without missing any.  Past Medical History  Diagnosis Date  . CHF (congestive heart failure)   . Diabetes mellitus   . Asthma   . Gout   . Shortness of breath   . Hypertension   . Stroke   . Headache   . Anxiety   . COPD (chronic obstructive pulmonary disease)   . Depression     Past Surgical History  Procedure Date  . Eye surgery 54 years old    Family History  Problem Relation Age of Onset  . Diabetes type II Mother   . Asthma Mother   . Diabetes type II Sister   . Asthma Sister   . Asthma Brother   . Asthma Son    Social History:  reports that he has been smoking Cigarettes.  He has a 20 pack-year smoking history. He does not have any smokeless tobacco history on file. He reports that he does not drink alcohol or use illicit drugs.  Allergies: No Known Allergies  Medications Prior to Admission  Medication Dose Route Frequency Provider Last Rate Last Dose  . 0.9 %  sodium chloride infusion  250 mL Intravenous PRN Lawal Garba 10 mL/hr at 09/15/11 0114 250 mL at 09/15/11 0114  . acetaminophen (TYLENOL) tablet 650 mg  650 mg Oral Q6H PRN Lawal Garba       Or  . acetaminophen (TYLENOL) suppository 650 mg  650 mg Rectal Q6H PRN Lawal Garba      . albuterol (PROVENTIL) (5 MG/ML) 0.5% nebulizer solution 5 mg  5 mg Nebulization Once Joya Gaskins, MD   5 mg at 09/14/11 1929  . albuterol (PROVENTIL) (5 MG/ML) 0.5% nebulizer solution 5 mg  5 mg Nebulization Once Joya Gaskins, MD   5 mg at 09/14/11 2132  . amLODipine (NORVASC) tablet 10 mg  10 mg Oral Daily Janice Coffin, RPH   10 mg at  09/15/11 0944  . antiseptic oral rinse (BIOTENE) solution 15 mL  15 mL Mouth Rinse q12n4p Jeffrey T McClung   15 mL at 09/15/11 1534  . chlorhexidine (PERIDEX) 0.12 % solution 15 mL  15 mL Mouth Rinse BID Elpidio Eric McClung   15 mL at 09/15/11 0826  . cloNIDine (CATAPRES - Dosed in mg/24 hr) patch 0.3 mg  0.3 mg Transdermal Weekly Saima Rizwan   0.3 mg at 09/15/11 1531  . enoxaparin (LOVENOX) injection 150 mg  1 mg/kg Subcutaneous To Major Fayne Norrie, PHARMD   150 mg at 09/14/11 2238  . enoxaparin (LOVENOX) injection 150 mg  150 mg Subcutaneous Q12H Saima Rizwan      . furosemide (LASIX) injection 40 mg  40 mg Intravenous Q8H Saima Rizwan   40 mg at 09/15/11 1503  . guaiFENesin (MUCINEX) 12 hr tablet 600 mg  600 mg Oral BID Saima Rizwan      . hydrALAZINE (APRESOLINE) injection 10 mg  10 mg Intravenous Q6H PRN Srikar A Reddy   10 mg at 09/15/11 0132  . HYDROcodone-acetaminophen (NORCO) 5-325 MG per tablet 1-2 tablet  1-2 tablet Oral Q4H PRN Lawal Garba      .  ipratropium (ATROVENT) nebulizer solution 0.5 mg  0.5 mg Nebulization Once Joya Gaskins, MD   0.5 mg at 09/14/11 1929  . ipratropium (ATROVENT) nebulizer solution 0.5 mg  0.5 mg Nebulization Q6H Lawal Garba   0.5 mg at 09/15/11 1432  . levalbuterol (XOPENEX) nebulizer solution 0.63 mg  0.63 mg Nebulization Q6H Lawal Garba   0.63 mg at 09/15/11 1432  . lisinopril (PRINIVIL,ZESTRIL) tablet 40 mg  40 mg Oral Daily Lawal Garba   40 mg at 09/15/11 0944  . methylPREDNISolone sodium succinate (SOLU-MEDROL) 125 MG injection 125 mg  125 mg Intravenous Once Joya Gaskins, MD   125 mg at 09/14/11 1942  . moxifloxacin (AVELOX) IVPB 400 mg  400 mg Intravenous Once Joya Gaskins, MD   400 mg at 09/14/11 1941  . moxifloxacin (AVELOX) IVPB 400 mg  400 mg Intravenous Q24H Lawal Garba   400 mg at 09/15/11 0115  . olmesartan (BENICAR) tablet 40 mg  40 mg Oral Daily Janice Coffin, RPH   40 mg at 09/15/11 0944  . ondansetron (ZOFRAN)  tablet 4 mg  4 mg Oral Q6H PRN Lawal Garba       Or  . ondansetron (ZOFRAN) injection 4 mg  4 mg Intravenous Q6H PRN Lawal Garba      . patient's guide to using coumadin book   Does not apply Once Gwenlyn Found Carney, PHARMD      . prednisoLONE tablet 40 mg  40 mg Oral Daily Saima Rizwan   40 mg at 09/15/11 1534  . simvastatin (ZOCOR) tablet 5 mg  5 mg Oral q1800 Lawal Garba   5 mg at 09/15/11 1816  . sodium chloride 0.9 % injection 3 mL  3 mL Intravenous Q12H Lawal Garba   3 mL at 09/15/11 1059  . sodium chloride 0.9 % injection 3 mL  3 mL Intravenous PRN Lawal Garba      . warfarin (COUMADIN) tablet 5 mg  5 mg Oral ONCE-1800 Jessica C Carney, PHARMD   5 mg at 09/15/11 1815  . warfarin (COUMADIN) video   Does not apply Once Jessica C Carney, PHARMD      . DISCONTD: amLODipine-olmesartan (AZOR) 10-40 MG per tablet 1 tablet  1 tablet Oral Daily Lawal Garba      . DISCONTD: cloNIDine (CATAPRES) tablet 0.2 mg  0.2 mg Oral BID Lawal Garba   0.2 mg at 09/15/11 0944  . DISCONTD: enoxaparin (LOVENOX) injection 150 mg  150 mg Subcutaneous Q12H Gala Lewandowsky [place], PHARMD   150 mg at 09/15/11 0930  . DISCONTD: furosemide (LASIX) tablet 40 mg  40 mg Oral Daily Lawal Garba   40 mg at 09/15/11 0944  . DISCONTD: furosemide (LASIX) tablet 40 mg  40 mg Oral Q8H Saima Rizwan      . DISCONTD: methylPREDNISolone sodium succinate (SOLU-MEDROL) 125 MG injection 125 mg  125 mg Intravenous Q6H Lawal Garba   125 mg at 09/15/11 0701   No current outpatient prescriptions on file as of 09/15/2011.    Results for orders placed during the hospital encounter of 09/14/11 (from the past 48 hour(s))  PRO B NATRIURETIC PEPTIDE     Status: Abnormal   Collection Time   09/14/11  6:34 PM      Component Value Range Comment   BNP, POC 11455.0 (*) 0 - 125 (pg/mL)   POCT I-STAT 3, BLOOD GAS (G3+)     Status: Abnormal   Collection Time   09/14/11  6:34 PM  Component Value Range Comment   pH, Arterial 7.327 (*) 7.350 - 7.450       pCO2 arterial 79.5 (*) 35.0 - 45.0 (mmHg)    pO2, Arterial 87.0  80.0 - 100.0 (mmHg)    Bicarbonate 41.6 (*) 20.0 - 24.0 (mEq/L)    TCO2 44  0 - 100 (mmol/L)    O2 Saturation 95.0      Acid-Base Excess 12.0 (*) 0.0 - 2.0 (mmol/L)    Collection site RADIAL, ALLEN'S TEST ACCEPTABLE      Drawn by Operator      Sample type ARTERIAL      Comment NOTIFIED PHYSICIAN     BASIC METABOLIC PANEL     Status: Abnormal   Collection Time   09/14/11  6:40 PM      Component Value Range Comment   Sodium 141  135 - 145 (mEq/L)    Potassium 4.2  3.5 - 5.1 (mEq/L)    Chloride 98  96 - 112 (mEq/L)    CO2 35 (*) 19 - 32 (mEq/L)    Glucose, Bld 108 (*) 70 - 99 (mg/dL)    BUN 19  6 - 23 (mg/dL)    Creatinine, Ser 1.61  0.50 - 1.35 (mg/dL)    Calcium 9.1  8.4 - 10.5 (mg/dL)    GFR calc non Af Amer 83 (*) >90 (mL/min)    GFR calc Af Amer >90  >90 (mL/min)   CBC     Status: Abnormal   Collection Time   09/14/11  6:40 PM      Component Value Range Comment   WBC 11.2 (*) 4.0 - 10.5 (K/uL)    RBC 4.85  4.22 - 5.81 (MIL/uL)    Hemoglobin 12.2 (*) 13.0 - 17.0 (g/dL)    HCT 09.6  04.5 - 40.9 (%)    MCV 84.1  78.0 - 100.0 (fL)    MCH 25.2 (*) 26.0 - 34.0 (pg)    MCHC 29.9 (*) 30.0 - 36.0 (g/dL)    RDW 81.1  91.4 - 78.2 (%)    Platelets 374  150 - 400 (K/uL)   DIFFERENTIAL     Status: Abnormal   Collection Time   09/14/11  6:40 PM      Component Value Range Comment   Neutrophils Relative 70  43 - 77 (%)    Neutro Abs 7.9 (*) 1.7 - 7.7 (K/uL)    Lymphocytes Relative 17  12 - 46 (%)    Lymphs Abs 1.9  0.7 - 4.0 (K/uL)    Monocytes Relative 12  3 - 12 (%)    Monocytes Absolute 1.3 (*) 0.1 - 1.0 (K/uL)    Eosinophils Relative 0  0 - 5 (%)    Eosinophils Absolute 0.0  0.0 - 0.7 (K/uL)    Basophils Relative 0  0 - 1 (%)    Basophils Absolute 0.0  0.0 - 0.1 (K/uL)   PROTIME-INR     Status: Abnormal   Collection Time   09/14/11  6:40 PM      Component Value Range Comment   Prothrombin Time 18.2 (*) 11.6 -  15.2 (seconds)    INR 1.48  0.00 - 1.49    POCT I-STAT TROPONIN I     Status: Abnormal   Collection Time   09/14/11  6:58 PM      Component Value Range Comment   Troponin i, poc 0.13 (*) 0.00 - 0.08 (ng/mL)    Comment NOTIFIED PHYSICIAN  Comment 3            POCT I-STAT 3, BLOOD GAS (G3+)     Status: Abnormal   Collection Time   09/14/11  8:05 PM      Component Value Range Comment   pH, Arterial 7.285 (*) 7.350 - 7.450     pCO2 arterial 90.4 (*) 35.0 - 45.0 (mmHg)    pO2, Arterial 174.0 (*) 80.0 - 100.0 (mmHg)    Bicarbonate 43.0 (*) 20.0 - 24.0 (mEq/L)    TCO2 46  0 - 100 (mmol/L)    O2 Saturation 99.0      Acid-Base Excess 12.0 (*) 0.0 - 2.0 (mmol/L)    Patient temperature 98.6 F      Collection site RADIAL, ALLEN'S TEST ACCEPTABLE      Drawn by Operator      Sample type ARTERIAL      Comment NOTIFIED PHYSICIAN     POCT I-STAT 3, BLOOD GAS (G3+)     Status: Abnormal   Collection Time   09/14/11  8:40 PM      Component Value Range Comment   pH, Arterial 7.285 (*) 7.350 - 7.450     pCO2 arterial 90.3 (*) 35.0 - 45.0 (mmHg)    pO2, Arterial 135.0 (*) 80.0 - 100.0 (mmHg)    Bicarbonate 42.9 (*) 20.0 - 24.0 (mEq/L)    TCO2 46  0 - 100 (mmol/L)    O2 Saturation 98.0      Acid-Base Excess 12.0 (*) 0.0 - 2.0 (mmol/L)    Patient temperature 98.6 F      Collection site RADIAL, ALLEN'S TEST ACCEPTABLE      Drawn by Operator      Sample type ARTERIAL      Comment NOTIFIED PHYSICIAN     MRSA PCR SCREENING     Status: Normal   Collection Time   09/15/11 12:00 AM      Component Value Range Comment   MRSA by PCR NEGATIVE  NEGATIVE    COMPREHENSIVE METABOLIC PANEL     Status: Abnormal   Collection Time   09/15/11  5:05 AM      Component Value Range Comment   Sodium 143  135 - 145 (mEq/L)    Potassium 4.3  3.5 - 5.1 (mEq/L)    Chloride 98  96 - 112 (mEq/L)    CO2 38 (*) 19 - 32 (mEq/L)    Glucose, Bld 138 (*) 70 - 99 (mg/dL)    BUN 18  6 - 23 (mg/dL)    Creatinine, Ser 1.61   0.50 - 1.35 (mg/dL)    Calcium 9.1  8.4 - 10.5 (mg/dL)    Total Protein 6.7  6.0 - 8.3 (g/dL)    Albumin 2.7 (*) 3.5 - 5.2 (g/dL)    AST 30  0 - 37 (U/L)    ALT 28  0 - 53 (U/L)    Alkaline Phosphatase 74  39 - 117 (U/L)    Total Bilirubin 0.4  0.3 - 1.2 (mg/dL)    GFR calc non Af Amer >90  >90 (mL/min)    GFR calc Af Amer >90  >90 (mL/min)   CBC     Status: Abnormal   Collection Time   09/15/11  5:05 AM      Component Value Range Comment   WBC 9.8  4.0 - 10.5 (K/uL)    RBC 4.69  4.22 - 5.81 (MIL/uL)    Hemoglobin 11.5 (*) 13.0 - 17.0 (g/dL)  HCT 39.6  39.0 - 52.0 (%)    MCV 84.4  78.0 - 100.0 (fL)    MCH 24.5 (*) 26.0 - 34.0 (pg)    MCHC 29.0 (*) 30.0 - 36.0 (g/dL)    RDW 27.2  53.6 - 64.4 (%)    Platelets 348  150 - 400 (K/uL)   PROTIME-INR     Status: Abnormal   Collection Time   09/15/11  5:05 AM      Component Value Range Comment   Prothrombin Time 19.2 (*) 11.6 - 15.2 (seconds)    INR 1.58 (*) 0.00 - 1.49    HEMOGLOBIN A1C     Status: Abnormal   Collection Time   09/15/11  5:05 AM      Component Value Range Comment   Hemoglobin A1C 6.9 (*) <5.7 (%)    Mean Plasma Glucose 151 (*) <117 (mg/dL)   TSH     Status: Abnormal   Collection Time   09/15/11  5:05 AM      Component Value Range Comment   TSH 0.191 (*) 0.350 - 4.500 (uIU/mL)   POCT I-STAT 3, BLOOD GAS (G3+)     Status: Abnormal   Collection Time   09/15/11 12:37 PM      Component Value Range Comment   pH, Arterial 7.343 (*) 7.350 - 7.450     pCO2 arterial 73.1 (*) 35.0 - 45.0 (mmHg)    pO2, Arterial 62.0 (*) 80.0 - 100.0 (mmHg)    Bicarbonate 39.7 (*) 20.0 - 24.0 (mEq/L)    TCO2 42  0 - 100 (mmol/L)    O2 Saturation 88.0      Acid-Base Excess 11.0 (*) 0.0 - 2.0 (mmol/L)    Patient temperature 98.7 F      Collection site RADIAL, ALLEN'S TEST ACCEPTABLE      Drawn by Operator      Sample type ARTERIAL      Comment NOTIFIED PHYSICIAN     CARDIAC PANEL(CRET KIN+CKTOT+MB+TROPI)     Status: Abnormal    Collection Time   09/15/11 12:59 PM      Component Value Range Comment   Total CK 391 (*) 7 - 232 (U/L)    CK, MB 9.0 (*) 0.3 - 4.0 (ng/mL)    Troponin I <0.30  <0.30 (ng/mL)    Relative Index 2.3  0.0 - 2.5    CARDIAC PANEL(CRET KIN+CKTOT+MB+TROPI)     Status: Abnormal   Collection Time   09/15/11  3:51 PM      Component Value Range Comment   Total CK 488 (*) 7 - 232 (U/L)    CK, MB 9.3 (*) 0.3 - 4.0 (ng/mL) CRITICAL VALUE NOTED.  VALUE IS CONSISTENT WITH PREVIOUSLY REPORTED AND CALLED VALUE.   Troponin I <0.30  <0.30 (ng/mL)    Relative Index 1.9  0.0 - 2.5    GLUCOSE, CAPILLARY     Status: Abnormal   Collection Time   09/15/11  5:17 PM      Component Value Range Comment   Glucose-Capillary 121 (*) 70 - 99 (mg/dL)    Dg Chest Port 1 View  09/14/2011  *RADIOLOGY REPORT*  Clinical Data: Shortness of breath.  PORTABLE CHEST - 1 VIEW  Comparison: Chest x-ray 08/03/2011.  Findings: The heart is enlarged but stable.  There is a central vascular congestion but no overt pulmonary edema.  No definite pleural effusions or focal infiltrates.  The bony thorax is intact.  IMPRESSION: Stable cardiac enlargement and chronic central  vascular congestion.  Original Report Authenticated By: P. Loralie Champagne, M.D.    Review of Systems  Constitutional: Positive for diaphoresis. Negative for fever and chills.  HENT: Negative.   Eyes: Negative.   Respiratory: Positive for cough, sputum production, shortness of breath and wheezing. Negative for hemoptysis.   Cardiovascular: Positive for palpitations and orthopnea.  Gastrointestinal: Negative.  Negative for vomiting, diarrhea and blood in stool.  Genitourinary: Negative.   Musculoskeletal: Negative.   Skin: Negative.   Neurological: Negative.   Endo/Heme/Allergies: Negative.   Psychiatric/Behavioral: Negative.     Blood pressure 138/89, pulse 103, temperature 98.4 F (36.9 C), temperature source Oral, resp. rate 29, height 6' (1.829 m), weight 159.7  kg (352 lb 1.2 oz), SpO2 95.00%. Physical Exam  Constitutional: He is oriented to person, place, and time. He appears well-developed. He appears distressed.       Morbidly obese  HENT:  Head: Normocephalic and atraumatic.  Right Ear: External ear normal.  Left Ear: External ear normal.  Eyes: Conjunctivae and EOM are normal. Pupils are equal, round, and reactive to light.  Neck: Normal range of motion. Neck supple. No JVD present. No thyromegaly present.  Cardiovascular: S1 normal, S2 normal and intact distal pulses.  Tachycardia present.   Respiratory: He is in respiratory distress. He has wheezes. He has rales. He exhibits no tenderness.  GI: Soft. Bowel sounds are normal.  Musculoskeletal: Normal range of motion.  Neurological: He is alert and oriented to person, place, and time. He has normal reflexes.  Skin: Skin is warm. He is diaphoretic.  Psychiatric: He has a normal mood and affect.     Assessment/Plan 1. Acute on chronic systolic and diastolic heart failure, Patient states that in 2006, he was told his heart was functioning at 60% of normal. EF today is 25% or so. 2. COPD exacerbation  3. CHF: High BNP but no CXR evidence of Congestion. Will continue Diuresis, Saline lock, 2D echo, Continue ACEI. 4. DM2: Continue home meds and SSI 5. Hypertension. 6. Morbid Obesity with probable obstructive sleep apnea.  7. H/O Tobacco use. 8. H/O pulmonary embolism on chronic coumadin therapy.   Plan: Add low dose selective beta blocker watching bronchitis exacerbation. Add digoxin. Continue IV diuresis and fluid restriction. Eventually will probably need a stress or heart cath. I have discussed with him regarding fluid restriction and not to use canned vegetables. Smoking cessation discussed. Thanks for the consult, will follow.   Ishmel Acevedo,JAGADEESH R 09/15/2011, 6:58 PM

## 2011-09-16 LAB — PRO B NATRIURETIC PEPTIDE: Pro B Natriuretic peptide (BNP): 4958 pg/mL — ABNORMAL HIGH (ref 0–125)

## 2011-09-16 LAB — BASIC METABOLIC PANEL
BUN: 23 mg/dL (ref 6–23)
Chloride: 98 mEq/L (ref 96–112)
GFR calc Af Amer: 80 mL/min — ABNORMAL LOW (ref >90)
GFR calc non Af Amer: 69 mL/min — ABNORMAL LOW (ref >90)
Glucose, Bld: 147 mg/dL — ABNORMAL HIGH (ref 70–99)
Potassium: 4.2 mEq/L (ref 3.5–5.1)
Sodium: 145 mEq/L (ref 135–145)

## 2011-09-16 LAB — GLUCOSE, CAPILLARY: Glucose-Capillary: 201 mg/dL — ABNORMAL HIGH (ref 70–99)

## 2011-09-16 LAB — PROTIME-INR: Prothrombin Time: 21 seconds — ABNORMAL HIGH (ref 11.6–15.2)

## 2011-09-16 MED ORDER — WARFARIN SODIUM 5 MG PO TABS
5.0000 mg | ORAL_TABLET | Freq: Once | ORAL | Status: AC
  Administered 2011-09-16: 5 mg via ORAL
  Filled 2011-09-16 (×2): qty 1

## 2011-09-16 MED ORDER — PREDNISONE 20 MG PO TABS
40.0000 mg | ORAL_TABLET | Freq: Every day | ORAL | Status: DC
  Administered 2011-09-17 – 2011-09-19 (×3): 40 mg via ORAL
  Filled 2011-09-16 (×4): qty 2

## 2011-09-16 MED ORDER — MOXIFLOXACIN HCL 400 MG PO TABS
400.0000 mg | ORAL_TABLET | Freq: Every day | ORAL | Status: DC
  Administered 2011-09-16 – 2011-09-19 (×5): 400 mg via ORAL
  Filled 2011-09-16 (×4): qty 1

## 2011-09-16 MED ORDER — SPIRONOLACTONE 25 MG PO TABS
25.0000 mg | ORAL_TABLET | Freq: Every day | ORAL | Status: DC
  Administered 2011-09-16 – 2011-09-19 (×4): 25 mg via ORAL
  Filled 2011-09-16 (×4): qty 1

## 2011-09-16 MED FILL — Perflutren Lipid Microsphere IV Susp 6.52 MG/ML: INTRAVENOUS | Qty: 2 | Status: AC

## 2011-09-16 NOTE — Progress Notes (Signed)
   CARE MANAGEMENT NOTE 09/16/2011  Patient:  Todd Sullivan, Todd Sullivan   Account Number:  [id number]  Date Initiated:  09/16/2011  Documentation initiated by:  Onnie Boer  Subjective/Objective Assessment:   PT WAS ADMITTED WITH COPD EXACERBATION     Action/Plan:   PROGRESSION OF CARE AND DISCHARGE PLANNING   Anticipated DC Date:  09/19/2011   Anticipated DC Plan:  HOME W HOME HEALTH SERVICES      DC Planning Services  CM consult      Choice offered to / List presented to:             Status of service:  In process, will continue to follow Medicare Important Message given?   (If response is "NO", the following Medicare IM given date fields will be blank) Date Medicare IM given:   Date Additional Medicare IM given:    Discharge Disposition:    Per UR Regulation:  Reviewed for med. necessity/level of care/duration of stay  Comments:  UR COMPLETED 09/16/2011 Onnie Boer, RN, BSN 1106 PT ADMITTED WITH COPD EXACERBATION AND CHF WILL NEED HH RN AT DC FOR TEACHING.  WILL F/U.

## 2011-09-16 NOTE — Progress Notes (Signed)
   CARE MANAGEMENT NOTE 09/16/2011  Patient:  Todd Sullivan, Todd Sullivan   Account Number:  [id number]  Date Initiated:  09/16/2011  Documentation initiated by:  Onnie Boer  Subjective/Objective Assessment:   PT WAS ADMITTED WITH COPD EXACERBATION     Action/Plan:   PROGRESSION OF CARE AND DISCHARGE PLANNING   Anticipated DC Date:  09/19/2011   Anticipated DC Plan:  HOME W HOME HEALTH SERVICES      DC Planning Services  CM consult      Choice offered to / List presented to:             Status of service:  In process, will continue to follow Medicare Important Message given?   (If response is "NO", the following Medicare IM given date fields will be blank) Date Medicare IM given:   Date Additional Medicare IM given:    Discharge Disposition:    Per UR Regulation:  Reviewed for med. necessity/level of care/duration of stay  Comments:  UR COMPLETED 09/16/2011 Onnie Boer, RN, BSN 1106 PT ADMITTED WITH COPD EXACERBATION AND CHF WILL NEED HH RN AT DC FOR TEACHING.  WILL F/U.  PT IS AT HOME ON DISABILITY AND HAS A HH AIDE 5 DAYS A WK THROUGH DELIVERANCE.  SPOKE WITH PT ABOUT HH RN FOR HF.  PT STATES THAT HE IS OPEN TO AN HH RN FROM ANOTHER AGENCY IF HE CANT GET IT THROUGH DELIVERANCE.  UNABLE TO GET HH RN , HH RN ARRANGED WITH [facility].

## 2011-09-16 NOTE — Consult Note (Signed)
Pt says he smokes 4 cigarettes per day. Action stage.  Wants help with medical aids. Strong oral fixation and habit as well as boredom involved in his smoking. Recommended 2 mg nicotine gum. Discussed gum use directions. Referred to 1-800 quit now for f/u and support. Discussed oral fixation substitutes, second hand smoke and in home smoking policy. Reviewed and gave pt Written education/contact information.

## 2011-09-16 NOTE — Progress Notes (Signed)
Subjective:  Feels better. Has had a negative Is/Os. Was able to walk a few steps.   Objective:  Vital Signs in the last 24 hours: Temp:  [97.5 F (36.4 C)-98.5 F (36.9 C)] 97.9 F (36.6 C) (12/07 0734) Pulse Rate:  [97-103] 98  (12/07 0800) Resp:  [21-30] 28  (12/07 0800) BP: (120-172)/(62-118) 120/76 mmHg (12/07 0800) SpO2:  [86 %-100 %] 94 % (12/07 0800) FiO2 (%):  [28 %-40 %] 40 % (12/07 0400) Weight:  [160.7 kg (354 lb 4.5 oz)] 354 lb 4.5 oz (160.7 kg) (12/07 0800)  Intake/Output from previous day: 12/06 0701 - 12/07 0700 In: 1548 [P.O.:1440; IV Piggyback:108] Out: 2525 [Urine:2525]      Lab Results:  [facility] 09/15/11 0505 09/14/11 1840  WBC 9.8 11.2*  HGB 11.5* 12.2*  PLT 348 374    Basename 09/16/11 0555 09/15/11 0505  NA 145 143  K 4.2 4.3  CL 98 98  CO2 38* 38*  GLUCOSE 147* 138*  BUN 23 18  CREATININE 1.17 0.88    Basename 09/16/11 0029 09/15/11 1551  TROPONINI <0.30 <0.30   Hepatic Function Panel  Basename 09/15/11 0505  PROT 6.7  ALBUMIN 2.7*  AST 30  ALT 28  ALKPHOS 74  BILITOT 0.4  BILIDIR --  IBILI --    Imaging: CXR pulmonary congestion, cardiomegaly.   Cardiac Studies: echo reviewed  Assessment/Plan:   Blood pressure 120/76, pulse 98, temperature 97.9 F (36.6 C), temperature source Oral, resp. rate 28, height 6' (1.829 m), weight 160.7 kg (354 lb 4.5 oz), SpO2 94.00%.    Morbidly obese  HENT: Head: Normocephalic and atraumatic.  Right Ear: External ear normal.  Left Ear: External ear normal.  Eyes: Conjunctivae and EOM are normal. Pupils are equal, round, and reactive to light.  Neck: Normal range of motion. Neck supple. No JVD present. No thyromegaly present.  Cardiovascular: S1 normal, S2 normal and intact distal pulses.  Tachycardia present.   Respiratory: He is very mild respiratory distress. He has wheezes. He has rales. He exhibits no tenderness.  GI: Soft. Bowel sounds are normal.  Musculoskeletal: Normal range of  motion.  Neurological: He is alert and oriented to person, place, and time. He has normal reflexes.  Skin: Skin is warm. He is diaphoretic but not in acute distress.  Psychiatric: He has a normal mood and affect.     Assessment/Plan 1. Acute on chronic systolic and diastolic heart failure, Patient states that in 2006, he was told his heart was functioning at 60% of normal. EF today is 25% or so. 2. COPD exacerbation  3. CHF: High BNP but no CXR evidence of Congestion. Will continue Diuresis, Saline lock, 2D echo, Continue ACEI. 4. DM2: Continue home meds and SSI 5. Hypertension. 6. Morbid Obesity with probable obstructive sleep apnea.  7. H/O Tobacco use. 8. H/O pulmonary embolism on chronic coumadin therapy.   Plan: Tolerating low dose selective beta blocker watching bronchitis exacerbation.Continue digoxin. Continue IV diuresis and fluid restriction. Eventually will probably need a stress or heart cath. I have discussed with him regarding fluid restriction and not to use canned vegetables again and made him repeat my statements from yesterday. Smoking cessation discussed. Thanks for the consult, will follow as op in 2 weeks or so.Switch to PO lasix in 24 hours. Will add aldactone 25 mg q daily and no K supplements to be given unless hypoklemia (patient will be on ACEi and aldactone)   Pamella Pert, M.D. 09/16/2011, 9:08 AM

## 2011-09-16 NOTE — Progress Notes (Signed)
Subjective:  He has not been taking his Lasix as he does not like having to urinate so much.  He has had a cough w/ congestion as well.  He noticed swelling in his rt first toe lately.  He also admits to having clots in his leg in 2007. He states he has been taking his coumadin "almost" as ordered. He has not had an INR in a few months.  Today he is feeling well. No dyspnea at rest. Still has dyspnea with exertion. Toes is feeling better. Cough is dry- he feels congested but is unable to bring up the mucous.   Objective: Patient Vitals for the past 24 hrs:  BP Temp Temp src Pulse Resp SpO2 Height Weight  09/16/11 0953 - - - 101  24  95 % - -  09/16/11 0800 120/76 mmHg - - 98  28  94 % 6' (1.829 m) 160.7 kg (354 lb 4.5 oz)  09/16/11 0734 - 97.9 F (36.6 C) Oral 100  - 97 % - -  09/16/11 0500 - - - - - 97 % - -  09/16/11 0415 - - - 97  30  97 % - -  09/16/11 0400 133/88 mmHg 97.5 F (36.4 C) Oral 99  29  98 % - -  09/16/11 0143 - - - - - 100 % - -  09/16/11 0100 - - - 101  28  98 % - -  09/16/11 0000 141/87 mmHg 98.5 F (36.9 C) Oral 102  28  97 % - -  09/15/11 2009 - 98.4 F (36.9 C) Oral - - 94 % - -  09/15/11 1625 138/89 mmHg - - 103  29  95 % - -  09/15/11 1433 - - - - - 94 % - -  09/15/11 1204 - 98.4 F (36.9 C) Oral - - - - -  09/15/11 1200 - - - 102  24  86 % - -   Weight change:   Intake/Output Summary (Last 24 hours) at 09/16/11 1130 Last data filed at 09/16/11 0900  Gross per 24 hour  Intake   1788 ml  Output   3125 ml  Net  -1337 ml    Physical Exam: General appearance: alert, cooperative and morbidly obese Eyes: conjunctivae/corneas clear. PERRL, EOM's intact. Fundi benign. Lungs: clear bilaterally but decreased Heart: regular rate and rhythm, S1, S2 normal, no murmur Abdomen: obese, soft, non-tender; bowel sounds normal; no masses,  no organomegaly Extremities: extremities normal, atraumatic, no cyanosis or edema- had erythema, swelling and tenderness in base  of left first toe. Neurologic: Grossly normal  Lab Results:  [facility] 09/16/11 0555 09/15/11 0505  NA 145 143  K 4.2 4.3  CL 98 98  CO2 38* 38*  GLUCOSE 147* 138*  BUN 23 18  CREATININE 1.17 0.88  CALCIUM 9.0 9.1  MG -- --  PHOS -- --    Basename 09/15/11 0505  AST 30  ALT 28  ALKPHOS 74  BILITOT 0.4  PROT 6.7  ALBUMIN 2.7*   No results found for this basename: LIPASE:2,AMYLASE:2 in the last 72 hours  Basename 09/15/11 0505 09/14/11 1840  WBC 9.8 11.2*  NEUTROABS -- 7.9*  HGB 11.5* 12.2*  HCT 39.6 40.8  MCV 84.4 84.1  PLT 348 374    Basename 09/16/11 0029 09/15/11 1551 09/15/11 1259  CKTOTAL 511* 488* 391*  CKMB 8.3* 9.3* 9.0*  CKMBINDEX -- -- --  TROPONINI <0.30 <0.30 <0.30    Basename 09/16/11 0555  09/14/11 1834  POCBNP 4958.0* 11455.0*   No results found for this basename: DDIMER:2 in the last 72 hours  Basename 09/15/11 0505  HGBA1C 6.9*   No results found for this basename: CHOL:2,HDL:2,LDLCALC:2,TRIG:2,CHOLHDL:2,LDLDIRECT:2 in the last 72 hours  Basename 09/15/11 0505  TSH 0.191*  T4TOTAL --  T3FREE --  THYROIDAB --   No results found for this basename: VITAMINB12:2,FOLATE:2,FERRITIN:2,TIBC:2,IRON:2,RETICCTPCT:2 in the last 72 hours  Micro Results: Recent Results (from the past 240 hour(s))  MRSA PCR SCREENING     Status: Normal   Collection Time   09/15/11 12:00 AM      Component Value Range Status Comment   MRSA by PCR NEGATIVE  NEGATIVE  Final     Studies/Results: Dg Chest Port 1 View  09/14/2011  *RADIOLOGY REPORT*  Clinical Data: Shortness of breath.  PORTABLE CHEST - 1 VIEW  Comparison: Chest x-ray 08/03/2011.  Findings: The heart is enlarged but stable.  There is a central vascular congestion but no overt pulmonary edema.  No definite pleural effusions or focal infiltrates.  The bony thorax is intact.  IMPRESSION: Stable cardiac enlargement and chronic central vascular congestion.  Original Report Authenticated By: P. Loralie Champagne, M.D.    Medications: Scheduled Meds:    . amLODipine  10 mg Oral Daily  . antiseptic oral rinse  15 mL Mouth Rinse q12n4p  . chlorhexidine  15 mL Mouth Rinse BID  . cloNIDine  0.3 mg Transdermal Weekly  . digoxin  0.25 mg Oral Daily  . enoxaparin (LOVENOX) injection  150 mg Subcutaneous Q12H  . furosemide  40 mg Intravenous Q8H  . guaiFENesin  600 mg Oral BID  . ipratropium  0.5 mg Nebulization Q6H  . levalbuterol  0.63 mg Nebulization Q6H  . lisinopril  40 mg Oral Daily  . metoprolol tartrate  12.5 mg Oral BID  . moxifloxacin  400 mg Intravenous Q24H  . olmesartan  40 mg Oral Daily  . patient's guide to using coumadin book   Does not apply Once  . prednisoLONE  40 mg Oral Daily  . simvastatin  5 mg Oral q1800  . sodium chloride  3 mL Intravenous Q12H  . spironolactone  25 mg Oral Daily  . warfarin  5 mg Oral ONCE-1800  . warfarin  5 mg Oral ONCE-1800  . warfarin   Does not apply Once  . DISCONTD: cloNIDine  0.2 mg Oral BID   Continuous Infusions:  PRN Meds:.sodium chloride, acetaminophen, acetaminophen, hydrALAZINE, HYDROcodone-acetaminophen, ondansetron (ZOFRAN) IV, ondansetron, sodium chloride  Assessment/Plan: Principal Problem:  *Respiratory failure, acute on chronic -multifactorial.   (a) COPD exacerbation/ Acute bronchitis- No wheezing now. started Prednisone 40 mg today.  Cont Nebs.  Added Guaifenesin.  Avelox day 3.  Repeat ABG reveals that CO2 has improved.  Ensure Po2 is not above 92% as he was retaining CO2.  Also, should have a sleep study later.  I will order CPAP for bedtime.   (b) CHF- acute systolic and diastolic - Dig, spironolactone and Metoprolol added by Dr Jacinto Halim today He does not take his Lasix.  Admission CXR revealed a moderate left sided pleural effusion and vascular congestion.  Cont IV Lasix Q8hrs (currently 40 mg PO daily). Watch I and O and daily weights. Has diuresed 1 L so far.  Already on Lisinopril and Olmesartan  (Azor) Repeat BNP tomorrow.   ECHO  Left ventricle: The cavity size was mildly dilated. There was severe concentric hypertrophy. Systolic function was severely reduced. The estimated ejection fraction  was in the range of 20% to 25%. Features are consistent with a pseudonormal left ventricular filling pattern, with concomitant abnormal relaxation and increased filling pressure (grade 2 diastolic dysfunction). Doppler parameters are consistent with both elevated ventricular end-diastolic filling pressure and elevated left atrial filling pressure. - Left atrium: The atrium was severely dilated. - Right ventricle: The cavity size was moderately decreased. Systolic function was moderately reduced. - Right atrium: The atrium was moderately dilated. - Atrial septum: No defect or patent foramen ovale was identified.   He will need a cardiac cath at a later date to r/o ischemic cause for the cardiomyopathy. Dr Jacinto Halim will follow him outpt setting.   Gout-  Steroids  helping with this. Will need Allopurinol once acute attack resolved.   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 On Full dose Lovenox until INR theraputic.   Nicotine Abuse- will request councilor speak with him.    DM Start Novolog sliding scale.   A1c 6.9 Takes Metformin at home.    HYPERTENSION- Would increase Clonidine and change him over to a patch to aid in compliance. (has Medicare)  Morbid Obesity  Abnormal TSH- check Free t4.  Transfer to telemetry.   LOS: 2 days   [facility] 09/16/2011, 11:30 AM

## 2011-09-16 NOTE — Progress Notes (Signed)
ANTICOAGULATION CONSULT NOTE - Initial Consult  Pharmacy Consult for Lovenox/Coumadin Indication: History of PE and history of CVA  Assessment: 55 year old male with history of a PE (unsure of date) and 2 CVAs in 2006 on Coumadin prior to arrival (patient reports 5 mg daily but has not had INR checked for a few months). INR 1.78 today; on full dose Lovenox.   Goal of Therapy:  INR 2-3   Plan:  1. Repeat Coumadin 5 mg x1 tonight.   2. Continue Lovenox 150mg  SQ q12h. 3. Daily PT/INR. 4. CBC q72 hrs while on lovenox.  Benjaman Pott, PharmD Pager [phone]  No Known Allergies  Patient Measurements: Height: 6' (182.9 cm) Weight: 354 lb 4.5 oz (160.7 kg) IBW/kg (Calculated) : 77.6   Vital Signs: Temp: 97.9 F (36.6 C) (12/07 0734) Temp src: Oral (12/07 0734) BP: 120/76 mmHg (12/07 0800) Pulse Rate: 101  (12/07 0953)  Labs:  Basename 09/16/11 0555 09/16/11 0029 09/15/11 1551 09/15/11 1259 09/15/11 0505 09/14/11 1840  HGB -- -- -- -- 11.5* 12.2*  HCT -- -- -- -- 39.6 40.8  PLT -- -- -- -- 348 374  APTT -- -- -- -- -- --  LABPROT 21.0* -- -- -- 19.2* 18.2*  INR 1.78* -- -- -- 1.58* 1.48  HEPARINUNFRC -- -- -- -- -- --  CREATININE 1.17 -- -- -- 0.88 1.00  CKTOTAL -- 511* 488* 391* -- --  CKMB -- 8.3* 9.3* 9.0* -- --  TROPONINI -- <0.30 <0.30 <0.30 -- --   Estimated Creatinine Clearance: 113.1 ml/min (by C-G formula based on Cr of 1.17).  Medical History: Past Medical History  Diagnosis Date  . CHF (congestive heart failure)   . Diabetes mellitus   . Asthma   . Gout   . Shortness of breath   . Hypertension   . Stroke   . Headache   . Anxiety   . COPD (chronic obstructive pulmonary disease)   . Depression     Medications:  Patient was on Coumadin prior to admission. Per patient has not had PT/INR in a while and was taking 5mg  (per pt report).    Benjaman Pott, PharmD Pager [phone]

## 2011-09-16 NOTE — Plan of Care (Signed)
Problem: Consults Goal: Heart Failure Patient Education (See Patient Education module for education specifics.)  Outcome: Progressing HF education packet given Goal: Diabetes Guidelines if Diabetic/Glucose > 140 If diabetic or lab glucose is > 140 mg/dl - Initiate Diabetes/Hyperglycemia Guidelines & Document Interventions  Outcome: Not Applicable Date Met:  09/16/11 CBG < 140

## 2011-09-17 LAB — CBC
HCT: 42.2 % (ref 39.0–52.0)
MCHC: 29.6 g/dL — ABNORMAL LOW (ref 30.0–36.0)
MCV: 84.1 fL (ref 78.0–100.0)
Platelets: 409 10*3/uL — ABNORMAL HIGH (ref 150–400)
RDW: 15.5 % (ref 11.5–15.5)
WBC: 15.1 10*3/uL — ABNORMAL HIGH (ref 4.0–10.5)

## 2011-09-17 LAB — PROTIME-INR: Prothrombin Time: 18.2 seconds — ABNORMAL HIGH (ref 11.6–15.2)

## 2011-09-17 LAB — GLUCOSE, CAPILLARY: Glucose-Capillary: 134 mg/dL — ABNORMAL HIGH (ref 70–99)

## 2011-09-17 LAB — BASIC METABOLIC PANEL
CO2: 41 mEq/L (ref 19–32)
Calcium: 9.1 mg/dL (ref 8.4–10.5)
Glucose, Bld: 153 mg/dL — ABNORMAL HIGH (ref 70–99)
Potassium: 3.9 mEq/L (ref 3.5–5.1)
Sodium: 141 mEq/L (ref 135–145)

## 2011-09-17 MED ORDER — FUROSEMIDE 40 MG PO TABS
40.0000 mg | ORAL_TABLET | Freq: Two times a day (BID) | ORAL | Status: DC
  Administered 2011-09-17 – 2011-09-19 (×6): 40 mg via ORAL
  Filled 2011-09-17 (×7): qty 1

## 2011-09-17 MED ORDER — WARFARIN SODIUM 5 MG PO TABS
5.0000 mg | ORAL_TABLET | Freq: Once | ORAL | Status: AC
  Administered 2011-09-17 (×2): 5 mg via ORAL
  Filled 2011-09-17: qty 1

## 2011-09-17 MED ORDER — ZOLPIDEM TARTRATE 5 MG PO TABS
5.0000 mg | ORAL_TABLET | Freq: Every evening | ORAL | Status: DC | PRN
  Administered 2011-09-18 (×2): 5 mg via ORAL
  Filled 2011-09-17 (×2): qty 1

## 2011-09-17 MED ORDER — BUDESONIDE-FORMOTEROL FUMARATE 160-4.5 MCG/ACT IN AERO
2.0000 | INHALATION_SPRAY | Freq: Two times a day (BID) | RESPIRATORY_TRACT | Status: DC
  Administered 2011-09-17: 2 via RESPIRATORY_TRACT
  Filled 2011-09-17: qty 6

## 2011-09-17 NOTE — Progress Notes (Signed)
Subjective:  He has not been taking his Lasix as he does not like having to urinate so much.  He has had a cough w/ congestion as well.  He noticed swelling in his rt first toe lately.  He also admits to having clots in his leg in 2007. He states he has been taking his coumadin "almost" as ordered. He has not had an INR in a few months.  Today he is feeling still better.Still has dyspnea with exertion. Toe is feeling better. Cough is dry- he feels congested but is unable to bring up the mucous. He declined the CPAP last night.  Objective: Patient Vitals for the past 24 hrs:  BP Temp Temp src Pulse Resp SpO2  09/17/11 1419 - - - - - 93 %  09/17/11 1402 145/91 mmHg 98 F (36.7 C) Oral 100  22  96 %  09/17/11 0805 185/118 mmHg - - - - -  09/17/11 0500 186/118 mmHg 98.2 F (36.8 C) Oral 100  24  93 %  09/17/11 0235 - - - 98  22  -  09/16/11 2200 152/93 mmHg 98.3 F (36.8 C) Oral 100  20  95 %  09/16/11 1958 - - - - - 95 %  09/16/11 1722 129/85 mmHg 97.8 F (36.6 C) Oral 96  21  94 %  09/16/11 1600 132/92 mmHg - - 99  22  94 %   Weight change:   Intake/Output Summary (Last 24 hours) at 09/17/11 1547 Last data filed at 09/17/11 1300  Gross per 24 hour  Intake   1084 ml  Output   4400 ml  Net  -3316 ml    Physical Exam: General appearance: alert, cooperative and morbidly obese Eyes: conjunctivae/corneas clear. PERRL, EOM's intact. Fundi benign. Lungs: clear bilaterally but decreased Heart: regular rate and rhythm, S1, S2 normal, no murmur Abdomen: obese, soft, non-tender; bowel sounds normal; no masses,  no organomegaly Extremities: extremities normal, atraumatic, no cyanosis or edema- had erythema, swelling and tenderness in base of left first toe. Neurologic: Grossly normal  Lab Results:  [facility] 09/17/11 0940 09/16/11 0555  NA 141 145  K 3.9 4.2  CL 91* 98  CO2 41* 38*  GLUCOSE 153* 147*  BUN 24* 23  CREATININE 1.08 1.17  CALCIUM 9.1 9.0  MG -- --  PHOS -- --     Basename 09/15/11 0505  AST 30  ALT 28  ALKPHOS 74  BILITOT 0.4  PROT 6.7  ALBUMIN 2.7*   No results found for this basename: LIPASE:2,AMYLASE:2 in the last 72 hours  Basename 09/16/11 2348 09/15/11 0505 09/14/11 1840  WBC 15.1* 9.8 --  NEUTROABS -- -- 7.9*  HGB 12.5* 11.5* --  HCT 42.2 39.6 --  MCV 84.1 84.4 --  PLT 409* 348 --    Basename 09/16/11 0029 09/15/11 1551 09/15/11 1259  CKTOTAL 511* 488* 391*  CKMB 8.3* 9.3* 9.0*  CKMBINDEX -- -- --  TROPONINI <0.30 <0.30 <0.30    Basename 09/17/11 0940 09/16/11 0555 09/14/11 1834  POCBNP 3111.0* 4958.0* 11455.0*   No results found for this basename: DDIMER:2 in the last 72 hours  Basename 09/15/11 0505  HGBA1C 6.9*   No results found for this basename: CHOL:2,HDL:2,LDLCALC:2,TRIG:2,CHOLHDL:2,LDLDIRECT:2 in the last 72 hours  Basename 09/15/11 0505  TSH 0.191*  T4TOTAL --  T3FREE --  THYROIDAB --   No results found for this basename: VITAMINB12:2,FOLATE:2,FERRITIN:2,TIBC:2,IRON:2,RETICCTPCT:2 in the last 72 hours  Micro Results: Recent Results (from the past 240 hour(s))  MRSA PCR SCREENING     Status: Normal   Collection Time   09/15/11 12:00 AM      Component Value Range Status Comment   MRSA by PCR NEGATIVE  NEGATIVE  Final     Studies/Results: Dg Chest Port 1 View  09/14/2011  *RADIOLOGY REPORT*  Clinical Data: Shortness of breath.  PORTABLE CHEST - 1 VIEW  Comparison: Chest x-ray 08/03/2011.  Findings: The heart is enlarged but stable.  There is a central vascular congestion but no overt pulmonary edema.  No definite pleural effusions or focal infiltrates.  The bony thorax is intact.  IMPRESSION: Stable cardiac enlargement and chronic central vascular congestion.  Original Report Authenticated By: P. Loralie Champagne, M.D.    Medications: Scheduled Meds:    . amLODipine  10 mg Oral Daily  . antiseptic oral rinse  15 mL Mouth Rinse q12n4p  . chlorhexidine  15 mL Mouth Rinse BID  . cloNIDine  0.3 mg  Transdermal Weekly  . digoxin  0.25 mg Oral Daily  . enoxaparin (LOVENOX) injection  150 mg Subcutaneous Q12H  . guaiFENesin  600 mg Oral BID  . ipratropium  0.5 mg Nebulization Q6H  . levalbuterol  0.63 mg Nebulization Q6H  . lisinopril  40 mg Oral Daily  . metoprolol tartrate  12.5 mg Oral BID  . moxifloxacin  400 mg Oral q1800  . olmesartan  40 mg Oral Daily  . predniSONE  40 mg Oral Q breakfast  . simvastatin  5 mg Oral q1800  . sodium chloride  3 mL Intravenous Q12H  . spironolactone  25 mg Oral Daily  . warfarin  5 mg Oral ONCE-1800  . warfarin  5 mg Oral ONCE-1800  . DISCONTD: furosemide  40 mg Intravenous Q8H   Continuous Infusions:  PRN Meds:.sodium chloride, acetaminophen, acetaminophen, hydrALAZINE, HYDROcodone-acetaminophen, ondansetron (ZOFRAN) IV, ondansetron, sodium chloride  Assessment/Plan: Principal Problem:  *Respiratory failure, acute on chronic -multifactorial.   (a) COPD exacerbation/ Acute bronchitis- No wheezing now. started Prednisone 40 mg today.  Cont Nebs.  Added Guaifenesin.  Avelox day 4 Repeat ABG reveals that CO2 has improved.  Ensure Po2 is not above 92% as he was retaining CO2.  Also, should have a sleep study later.  I have ordered CPAP for bedtime but he is uncomfortable with this mask.    (b) CHF- acute systolic and diastolic - Dig, spironolactone and Metoprolol added by Dr Jacinto Halim  He does not take his Lasix at home but has been strongly advised to.  Admission CXR revealed a moderate left sided pleural effusion and vascular congestion.  D/c IV Lasix Q8hrs (currently 40 mg PO daily).  Has diuresed 3 L so far.  Will Place on BID PO Lasix as recommended by Dr Jacinto Halim.   Already on Lisinopril and Olmesartan (Azor) Repeat BNP is improving.   ECHO  Left ventricle: The cavity size was mildly dilated. There was severe concentric hypertrophy. Systolic function was severely reduced. The estimated ejection fraction was in the range of 20% to 25%.  Features are consistent with a pseudonormal left ventricular filling pattern, with concomitant abnormal relaxation and increased filling pressure (grade 2 diastolic dysfunction). Doppler parameters are consistent with both elevated ventricular end-diastolic filling pressure and elevated left atrial filling pressure. - Left atrium: The atrium was severely dilated. - Right ventricle: The cavity size was moderately decreased. Systolic function was moderately reduced. - Right atrium: The atrium was moderately dilated. - Atrial septum: No defect or patent foramen ovale was identified.  He will need a cardiac cath at a later date to r/o ischemic cause for the cardiomyopathy. Dr Jacinto Halim will follow him outpt setting.   Gout-  Steroids  helping with this. Will need Allopurinol once acute attack resolved.   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 On Full dose Lovenox until INR theraputic.   Nicotine Abuse- councilor has met with him. I have counciled him as well.    DM Start Novolog sliding scale.   A1c 6.9 Takes Metformin at home.    HYPERTENSION- Have increased Clonidine and changed him over to a patch to aid in compliance. (has Medicare) BP still a little high at 145/91. Will continue to monitor for now.   Morbid Obesity  Sick euthyroid syndrome- Low TSH- normal Free t4.  Transfer to telemetry.   LOS: 3 days   [facility] 09/17/2011, 3:47 PM

## 2011-09-17 NOTE — Progress Notes (Signed)
CRITICAL VALUE ALERT  Critical value received:  CO2 41   Date of notification: 09/17/11  Time of notification:  1045  Critical value read back:yes  Nurse who received alert:  Tomasa Rand RN  MD notified (1st page):  Rizwan   Time of first page:  1105  MD notified (2nd page):  Time of second page:  Responding MD:     Time MD responded:

## 2011-09-17 NOTE — Progress Notes (Signed)
ANTICOAGULATION CONSULT NOTE - Initial Consult  Pharmacy Consult for Lovenox/Coumadin Indication: History of PE and history of CVA  Assessment: 55 year old male with history of PE and 2 CVAs in 2006 on Coumadin prior to arrival (patient reports 5 mg daily but has not had INR checked for a few months). INR 1.48 today; on full dose Lovenox until INR therapeutic.  Not entirely clear why INR dropped overnight. Still receiving Moxifloxacin. Likely not d/t switch from prednisolone 40mg  to prednisone 40mg . Won't see effects of increasing coumadin dose on INR right away, so will repeat same dose from yesterday and f/u tomorrow.  Goal of Therapy:  INR 2-3   Plan:  1. Coumadin 5 mg x1 @1800  2. Continue Lovenox 150mg  SQ q12h. 3. F/u AM labs  Wyline Copas PharmD [phone] pager    No Known Allergies  Patient Measurements: Height: 6' (182.9 cm) Weight: 354 lb 4.5 oz (160.7 kg) IBW/kg (Calculated) : 77.6   Vital Signs: Temp: 98.2 F (36.8 C) (12/08 0500) Temp src: Oral (12/08 0500) BP: 185/118 mmHg (12/08 0805) Pulse Rate: 100  (12/08 0500)  Labs:  Basename 09/17/11 0940 09/17/11 0500 09/16/11 2348 09/16/11 0555 09/16/11 0029 09/15/11 1551 09/15/11 1259 09/15/11 0505 09/14/11 1840  HGB -- -- 12.5* -- -- -- -- 11.5* --  HCT -- -- 42.2 -- -- -- -- 39.6 40.8  PLT -- -- 409* -- -- -- -- 348 374  APTT -- -- -- -- -- -- -- -- --  LABPROT -- 18.2* -- 21.0* -- -- -- 19.2* --  INR -- 1.48 -- 1.78* -- -- -- 1.58* --  HEPARINUNFRC -- -- -- -- -- -- -- -- --  CREATININE 1.08 -- -- 1.17 -- -- -- 0.88 --  CKTOTAL -- -- -- -- 511* 488* 391* -- --  CKMB -- -- -- -- 8.3* 9.3* 9.0* -- --  TROPONINI -- -- -- -- <0.30 <0.30 <0.30 -- --   Estimated Creatinine Clearance: 122.5 ml/min (by C-G formula based on Cr of 1.08).  Medical History: Past Medical History  Diagnosis Date  . CHF (congestive heart failure)   . Diabetes mellitus   . Asthma   . Gout   . Shortness of breath   . Hypertension     . Stroke   . Headache   . Anxiety   . COPD (chronic obstructive pulmonary disease)   . Depression

## 2011-09-18 LAB — BASIC METABOLIC PANEL
BUN: 22 mg/dL (ref 6–23)
Calcium: 9.3 mg/dL (ref 8.4–10.5)
Creatinine, Ser: 1.08 mg/dL (ref 0.50–1.35)
GFR calc Af Amer: 88 mL/min — ABNORMAL LOW (ref >90)
GFR calc non Af Amer: 76 mL/min — ABNORMAL LOW (ref >90)

## 2011-09-18 LAB — GLUCOSE, CAPILLARY
Glucose-Capillary: 172 mg/dL — ABNORMAL HIGH (ref 70–99)
Glucose-Capillary: 200 mg/dL — ABNORMAL HIGH (ref 70–99)

## 2011-09-18 LAB — PROTIME-INR: Prothrombin Time: 17.3 seconds — ABNORMAL HIGH (ref 11.6–15.2)

## 2011-09-18 MED ORDER — WARFARIN SODIUM 7.5 MG PO TABS
7.5000 mg | ORAL_TABLET | Freq: Once | ORAL | Status: AC
  Administered 2011-09-18: 7.5 mg via ORAL
  Filled 2011-09-18: qty 1

## 2011-09-18 MED ORDER — BUDESONIDE-FORMOTEROL FUMARATE 160-4.5 MCG/ACT IN AERO
2.0000 | INHALATION_SPRAY | Freq: Two times a day (BID) | RESPIRATORY_TRACT | Status: DC
  Administered 2011-09-18 – 2011-09-19 (×3): 2 via RESPIRATORY_TRACT

## 2011-09-18 NOTE — Plan of Care (Signed)
Problem: Phase II Progression Outcomes Goal: Walk in [place] or up in chair TID Outcome: Completed/Met Date Met:  09/18/11 Pt up to chair several times daily---tolerates well.  Has NOT walked in halls. Dierdre Highman

## 2011-09-18 NOTE — Progress Notes (Signed)
Pt refused cpap qhs, no distress noted, will cont to monitor.

## 2011-09-18 NOTE — Progress Notes (Signed)
PCP: Karie Chimera, MD  Brief HPI: 55 yo morbidly obese man with history of CHF and Diabetes here with progressive SOB, lower extremity edema for weeks. He has been having cough no fever. Still smokes cigarette but trying to quit. In ED, he was found to be hypoxic requiring Bipap. Was only on as needed lasix. Not compliant with coumadin.  Past medical history: CHF (congestive heart failure), Diabetes mellitus, Asthma, Gout   Consultants: Dr. Jacinto Halim  Procedures: None  Subjective: Patient feels close to baseline. Had a few falls at home prior to admission. Lives by himself. Denies any SOB or CP currently. Improved since admission.  Objective: Vital signs in last 24 hours: Temp:  [97.7 F (36.5 C)-98.1 F (36.7 C)] 98.1 F (36.7 C) (12/09 0600) Pulse Rate:  [93-100] 98  (12/09 0845) Resp:  [14-22] 17  (12/09 0845) BP: (145-151)/(87-93) 151/93 mmHg (12/09 0600) SpO2:  [93 %-98 %] 95 % (12/09 0845) Weight:  [153.9 kg (339 lb 4.6 oz)] 339 lb 4.6 oz (153.9 kg) (12/09 0600) Weight change: -6.8 kg (-14 lb 15.9 oz) Last BM Date: 09/16/11  Intake/Output from previous day: 12/08 0701 - 12/09 0700 In: 360 [P.O.:360] Out: 4525 [Urine:4525] Intake/Output this shift:    General appearance: alert, cooperative, appears stated age and morbidly obese Head: Normocephalic, without obvious abnormality, atraumatic Resp: Decreased air entry at bases but otherwise clear. Cardio: regular rate and rhythm, S1, S2 normal, no murmur, click, rub or gallop GI: soft, non-tender; bowel sounds normal; no masses,  no organomegaly Extremities: edema 1+ in b/l LE Neurologic: Grossly normal  Lab Results:  Basename 09/16/11 2348  WBC 15.1*  HGB 12.5*  HCT 42.2  PLT 409*   BMET  Basename 09/18/11 0715 09/17/11 0940  NA 143 141  K 4.0 3.9  CL 95* 91*  CO2 44* 41*  GLUCOSE 103* 153*  BUN 22 24*  CREATININE 1.08 1.08  CALCIUM 9.3 9.1    Studies/Results: 2D-ECHO Done 09/15/11  Study  Conclusions  - Left ventricle: The cavity size was mildly dilated. There was severe concentric hypertrophy. Systolic function was severely reduced. The estimated ejection fraction was in the range of 20% to 25%. Features are consistent with a pseudonormal left ventricular filling pattern, with concomitant abnormal relaxation and increased filling pressure (grade 2 diastolic dysfunction). Doppler parameters are consistent with both elevated ventricular end-diastolic filling pressure and elevated left atrial filling pressure. - Left atrium: The atrium was severely dilated. - Right ventricle: The cavity size was moderately decreased. Systolic function was moderately reduced. - Right atrium: The atrium was moderately dilated. - Atrial septum: No defect or patent foramen ovale was identified.    Medications:  Scheduled:   . amLODipine  10 mg Oral Daily  . antiseptic oral rinse  15 mL Mouth Rinse q12n4p  . budesonide-formoterol  2 puff Inhalation BID  . chlorhexidine  15 mL Mouth Rinse BID  . cloNIDine  0.3 mg Transdermal Weekly  . digoxin  0.25 mg Oral Daily  . enoxaparin (LOVENOX) injection  150 mg Subcutaneous Q12H  . furosemide  40 mg Oral BID  . guaiFENesin  600 mg Oral BID  . ipratropium  0.5 mg Nebulization Q6H  . levalbuterol  0.63 mg Nebulization Q6H  . lisinopril  40 mg Oral Daily  . metoprolol tartrate  12.5 mg Oral BID  . moxifloxacin  400 mg Oral q1800  . olmesartan  40 mg Oral Daily  . predniSONE  40 mg Oral Q breakfast  .  simvastatin  5 mg Oral q1800  . sodium chloride  3 mL Intravenous Q12H  . spironolactone  25 mg Oral Daily  . warfarin  5 mg Oral ONCE-1800  . DISCONTD: budesonide-formoterol  2 puff Inhalation BID    Principal Problem:  *Respiratory failure, acute Active Problems:  DM  HYPERTENSION  CONGESTIVE HEART FAILURE  COPD exacerbation  Obesities, morbid  VTE (venous thromboembolism)  Gout attack  Nicotine abuse  Systolic and diastolic CHF, acute on  chronic   Assessment/Plan: 1. COPD exacerbation/ Acute bronchitis: No wheezing now. On oral steroids. Cont Nebs. On inhaled steroids. Avelox day 5. Ideally needs a sleep study at some point.   2. CHF- acute systolic and diastolic:  - Dig, spironolactone and Metoprolol added by Dr Jacinto Halim  He does not take his Lasix at home but has been strongly advised to.  On BID PO Lasix as recommended by Dr Jacinto Halim.  Already on Lisinopril and Olmesartan (Azor)  Further management/evaluation per Dr. Consuello Closs as OP.   3. Gout- Steroids helping with this. Will need Allopurinol once acute attack resolved.   4. 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 On Full dose Lovenox until INR theraputic. May need to go home with lovenox.  5. Nicotine Abuse- Counseling provided.   6. DM: ON SSI. A1c 6.9. Takes Metformin at home.   7. Hypertension, poorly controlled: BP is better compared to last few days. On multiple agents currently. Follow with cards as OP. BP should improve as steroid is tapered off as well.  8. Morbid Obesity   9. Sick euthyroid syndrome- Low TSH- normal Free t4.   PT/OT to evaluate as he lives alone and has fallen. May need Home Health.  Possible discharge in AM if cleared by PT/OT.   LOS: 4 days   [facility] Pager [phone] 09/18/2011, 9:39 AM

## 2011-09-18 NOTE — Progress Notes (Signed)
ANTICOAGULATION CONSULT NOTE - Follow up Consult  Pharmacy Consult for Lovenox/Coumadin Indication: History of PE and history of CVA  Assessment: 55 year old male with history of PE and 2 CVAs in 2006 on Coumadin prior to arrival (patient reports 5 mg daily but has not had INR checked for a few months). INR continues to drop despite home dose regimen of 5mg  daily. INR 1.39 today, 1.48 yesterday; on full dose Lovenox until INR therapeutic.  Not entirely clear why INR continues to drop or why 2 doses of coumadin are charted yesterday.   No bleeding, per pt. CBC stable on 12/7. No new CBCs. Pt's renal function is good.  Goal of Therapy:  INR 2-3   Plan:  1. Will try a higher dose today. Coumadin 7.5 mg x1 @1800  2. Continue Lovenox 150mg  SQ q12h. 3. F/u AM labs  Wyline Copas PharmD [phone] pager    No Known Allergies  Patient Measurements: Height: 6' (182.9 cm) Weight: 339 lb 4.6 oz (153.9 kg) IBW/kg (Calculated) : 77.6   Vital Signs: Temp: 97.9 F (36.6 C) (12/09 0900) Temp src: Oral (12/09 0900) BP: 146/97 mmHg (12/09 0900) Pulse Rate: 94  (12/09 0900)  Labs:  Basename 09/18/11 0715 09/17/11 0940 09/17/11 0500 09/16/11 2348 09/16/11 0555 09/16/11 0029 09/15/11 1551  HGB -- -- -- 12.5* -- -- --  HCT -- -- -- 42.2 -- -- --  PLT -- -- -- 409* -- -- --  APTT -- -- -- -- -- -- --  LABPROT 17.3* -- 18.2* -- 21.0* -- --  INR 1.39 -- 1.48 -- 1.78* -- --  HEPARINUNFRC -- -- -- -- -- -- --  CREATININE 1.08 1.08 -- -- 1.17 -- --  CKTOTAL -- -- -- -- -- 511* 488*  CKMB -- -- -- -- -- 8.3* 9.3*  TROPONINI -- -- -- -- -- <0.30 <0.30   Estimated Creatinine Clearance: 119.6 ml/min (by C-G formula based on Cr of 1.08).  Medical History: Past Medical History  Diagnosis Date  . CHF (congestive heart failure)   . Diabetes mellitus   . Asthma   . Gout   . Shortness of breath   . Hypertension   . Stroke   . Headache   . Anxiety   . COPD (chronic obstructive pulmonary  disease)   . Depression

## 2011-09-18 NOTE — Progress Notes (Signed)
CRITICAL VALUE ALERT  Critical value received:  CO2 44  Date of notification:  09/18/11  Time of notification:  0815  Critical value read back:yes  Nurse who received alert:  Charlena Cross RN  MD notified (1st page):  Dr. Rito Ehrlich  Time of first page:  0840     MD notified (2nd page):  Time of second page:  Responding MD:    Time MD responded:

## 2011-09-19 LAB — BASIC METABOLIC PANEL
BUN: 19 mg/dL (ref 6–23)
CO2: 39 mEq/L — ABNORMAL HIGH (ref 19–32)
Calcium: 9.1 mg/dL (ref 8.4–10.5)
Chloride: 95 mEq/L — ABNORMAL LOW (ref 96–112)
Creatinine, Ser: 0.96 mg/dL (ref 0.50–1.35)

## 2011-09-19 LAB — MAGNESIUM: Magnesium: 2.1 mg/dL (ref 1.5–2.5)

## 2011-09-19 LAB — PROTIME-INR
INR: 1.46 (ref 0.00–1.49)
Prothrombin Time: 18 seconds — ABNORMAL HIGH (ref 11.6–15.2)

## 2011-09-19 LAB — GLUCOSE, CAPILLARY: Glucose-Capillary: 183 mg/dL — ABNORMAL HIGH (ref 70–99)

## 2011-09-19 MED ORDER — WARFARIN SODIUM 5 MG PO TABS: 7.5000 mg | ORAL_TABLET | Freq: Every day | ORAL | Status: DC

## 2011-09-19 MED ORDER — CLONIDINE HCL 0.2 MG PO TABS: 0.2000 mg | ORAL_TABLET | Freq: Two times a day (BID) | ORAL | Status: DC

## 2011-09-19 MED ORDER — METOPROLOL TARTRATE 12.5 MG HALF TABLET: 12.5000 mg | ORAL_TABLET | Freq: Two times a day (BID) | ORAL | Status: DC

## 2011-09-19 MED ORDER — PREDNISONE 20 MG PO TABS: ORAL_TABLET | ORAL | Status: DC

## 2011-09-19 MED ORDER — ALBUTEROL SULFATE HFA 108 (90 BASE) MCG/ACT IN AERS: 2.0000 | INHALATION_SPRAY | RESPIRATORY_TRACT | Status: AC | PRN

## 2011-09-19 MED ORDER — AMLODIPINE-OLMESARTAN 10-40 MG PO TABS: 1.0000 | ORAL_TABLET | Freq: Every day | ORAL | Status: DC

## 2011-09-19 MED ORDER — BIOTENE DRY MOUTH MT LIQD: 15.0000 mL | Freq: Two times a day (BID) | OROMUCOSAL | Status: DC

## 2011-09-19 MED ORDER — DIGOXIN 250 MCG PO TABS: 0.2500 mg | ORAL_TABLET | Freq: Every day | ORAL | Status: DC

## 2011-09-19 MED ORDER — FUROSEMIDE 40 MG PO TABS: 40.0000 mg | ORAL_TABLET | Freq: Two times a day (BID) | ORAL | Status: DC

## 2011-09-19 MED ORDER — WARFARIN SODIUM 7.5 MG PO TABS
7.5000 mg | ORAL_TABLET | Freq: Once | ORAL | Status: AC
  Administered 2011-09-19: 7.5 mg via ORAL
  Filled 2011-09-19: qty 1

## 2011-09-19 MED ORDER — IPRATROPIUM-ALBUTEROL 0.5-2.5 (3) MG/3ML IN SOLN: 3.0000 mL | Freq: Four times a day (QID) | RESPIRATORY_TRACT | Status: DC | PRN

## 2011-09-19 MED ORDER — ALBUTEROL SULFATE HFA 108 (90 BASE) MCG/ACT IN AERS: 2.0000 | INHALATION_SPRAY | RESPIRATORY_TRACT | Status: DC | PRN

## 2011-09-19 MED ORDER — ENOXAPARIN SODIUM 150 MG/ML ~~LOC~~ SOLN: 150.0000 mg | Freq: Two times a day (BID) | SUBCUTANEOUS | Status: AC

## 2011-09-19 MED ORDER — BUDESONIDE-FORMOTEROL FUMARATE 160-4.5 MCG/ACT IN AERO: 2.0000 | INHALATION_SPRAY | Freq: Two times a day (BID) | RESPIRATORY_TRACT | Status: AC

## 2011-09-19 MED ORDER — PRAVASTATIN SODIUM 20 MG PO TABS: 20.0000 mg | ORAL_TABLET | Freq: Every day | ORAL | Status: DC

## 2011-09-19 MED ORDER — LISINOPRIL 40 MG PO TABS: 40.0000 mg | ORAL_TABLET | Freq: Every day | ORAL | Status: DC

## 2011-09-19 MED ORDER — MOXIFLOXACIN HCL 400 MG PO TABS: 400.0000 mg | ORAL_TABLET | Freq: Every day | ORAL | Status: AC

## 2011-09-19 MED ORDER — SPIRONOLACTONE 25 MG PO TABS: 25.0000 mg | ORAL_TABLET | Freq: Every day | ORAL | Status: DC

## 2011-09-19 MED ORDER — OLMESARTAN MEDOXOMIL 40 MG PO TABS: 40.0000 mg | ORAL_TABLET | Freq: Every day | ORAL | Status: DC

## 2011-09-19 MED ORDER — CLONIDINE HCL 0.3 MG/24HR TD PTWK: 1.0000 | MEDICATED_PATCH | TRANSDERMAL | Status: DC

## 2011-09-19 NOTE — Discharge Summary (Addendum)
Physician Discharge Summary  Patient ID: Todd Sullivan MRN: [id number] DOB/AGE: July 30, 1956 55 y.o.  Admit date: 09/14/2011 Discharge date: 09/19/2011  Admission Diagnoses: CHF Exacerbation COPD exacerbation  Discharge Diagnoses:  Principal Problem:  *Respiratory failure, acute Active Problems:  DM  HYPERTENSION  CONGESTIVE HEART FAILURE  COPD exacerbation  Obesities, morbid  VTE (venous thromboembolism)  Gout attack  Nicotine abuse  Systolic and diastolic CHF, acute on chronic   Discharged Condition: fair  Initial History: 55 yo morbidly obese man with history of CHF and Diabetes here with progressive SOB, lower extremity edema for weeks. He has been having cough no fever. Still smokes cigarette but trying to quit. In ED, he was found to be hypoxic requiring Bipap. Was only on as needed lasix. Not compliant with coumadin.   Past medical history: CHF (congestive heart failure), Diabetes mellitus, Asthma, Gout   Consultants: Dr. Arne Cleveland Course:   1. COPD exacerbation/ Acute bronchitis: The patient was thought to have a combination of COPD, as well as CHF exacerbation. Patient was put on steroids, nebulizer treatments, and antibiotics. He is significantly improved. He no longer has any wheezing in his lungs. Antibiotics will be continued for 3 more days. He's been given a prescription for nebulizer treatments at home. He will need a sleep study at some point and I will defer this issue to the patient's primary care physician and his cardiologist.   2. CHF- acute systolic and diastolic: The patient had a lot of lower extremity edema. He was put on intravenous Lasix and diuresed aggressively. His edema has improved, but still persists. He'll be sent out on twice-daily dosing of Lasix. He's review the echocardiogram report as mentioned below. He is also being sent home on digoxin and spironolactone and beta blockers. He will also be on ACE inhibitors. He is known to be  noncompliant and compliance was encouraged to the patient. According to Dr. Newt Lukes. Patient may require a cardiac catheterization at some point. However, he would like to see patient being compliant with some of his medication before he pursues this. He also think that patient may end up requiring ICD. However, at this point he just wants to monitor. Patient did have a couple episodes of nonsustained VT through which he was asymptomatic. EKG does not show any ischemic changes. His electrolytes are within the normal range. Have discussed this issue with Dr. Newt Lukes as well.  3. Acute Gout- Steroids helping with this. Will need Allopurinol once acute attack resolved. PCP to please look into allopurinol at followup.  4. VTE with a history of DVT and PE in the past. Apparently, these episodes happen a few years ago. Unclear, if he's had more than one episode. When he presented to the hospital his INR was subtherapeutic. He was put on Lovenox. He'll be sent home on bridging therapy with Lovenox till INR becomes therapeutic.  5. Nicotine Abuse- Counseling provided.   6. DM: Patient asked to follow up with his PCP for the management of the same.  7. Hypertension, poorly controlled: BP is better compared to last few days. On multiple agents currently. Follow with cards as OP. BP should improve as steroid is tapered off as well.   8. Morbid Obesity   9. Sick euthyroid syndrome- Low TSH- normal Free t4.   Patient was seen by physical therapy and occupational therapy and he will require home health PT. Does not require any occupational therapy at home.  An hour and will also be a see  the patient at home for compliance issues.  PERTINENT LABS Patient's BNP was 4900. His cardiac enzymes were unremarkable. Especially the troponin. He did have an elevated total CK. His INR today is 1.46. His magnesium level is 2.1.   2D-ECHO Done 09/15/11  Study Conclusions  - Left ventricle: The cavity size was mildly  dilated. There was severe concentric hypertrophy. Systolic function was severely reduced. The estimated ejection fraction was in the range of 20% to 25%. Features are consistent with a pseudonormal left ventricular filling pattern, with concomitant abnormal relaxation and increased filling pressure (grade 2 diastolic dysfunction). Doppler parameters are consistent with both elevated ventricular end-diastolic filling pressure and elevated left atrial filling pressure. - Left atrium: The atrium was severely dilated. - Right ventricle: The cavity size was moderately decreased. Systolic function was moderately reduced. - Right atrium: The atrium was moderately dilated. - Atrial septum: No defect or patent foramen ovale was identified.  IMAGING STUDIES Dg Chest Port 1 View  09/14/2011  *RADIOLOGY REPORT*  Clinical Data: Shortness of breath.  PORTABLE CHEST - 1 VIEW  Comparison: Chest x-ray 08/03/2011.  Findings: The heart is enlarged but stable.  There is a central vascular congestion but no overt pulmonary edema.  No definite pleural effusions or focal infiltrates.  The bony thorax is intact.  IMPRESSION: Stable cardiac enlargement and chronic central vascular congestion.  Original Report Authenticated By: P. Loralie Champagne, M.D.    Discharge Exam: Blood pressure 152/71, pulse 94, temperature 98 F (36.7 C), temperature source Oral, resp. rate 18, height 6' (1.829 m), weight 153.9 kg (339 lb 4.6 oz), SpO2 92.00%. General appearance: alert, cooperative, no distress and morbidly obese Resp: decreased air entry at bases. Few crackles. No wheezing Cardio: regular rate and rhythm, S1, S2 normal, no murmur, click, rub or gallop GI: soft, non-tender; bowel sounds normal; no masses,  no organomegaly Neurologic: Grossly normal  Disposition: Home  Current Discharge Medication List    START taking these medications   Details  cloNIDine (CATAPRES - DOSED IN MG/24 HR) 0.3 mg/24hr Place 1 patch (0.3 mg  total) onto the skin once a week. Qty: 4 patch, Refills: 3    digoxin (LANOXIN) 0.25 MG tablet Take 1 tablet (0.25 mg total) by mouth daily. Qty: 3 tablet, Refills: 0    enoxaparin (LOVENOX) 150 MG/ML injection Inject 1 mL (150 mg total) into the skin every 12 (twelve) hours. Qty: 10 mL, Refills: 0    metoprolol tartrate (LOPRESSOR) 12.5 mg TABS Take 0.5 tablets (12.5 mg total) by mouth 2 (two) times daily. Qty: 3 tablet, Refills: 0    moxifloxacin (AVELOX) 400 MG tablet Take 1 tablet (400 mg total) by mouth daily at 6 PM. Qty: 3 tablet, Refills: 0    predniSONE (DELTASONE) 20 MG tablet 2 tablets daily for 4 days, then 1 tablet daily for 6 days, then stop Qty: 6 tablet, Refills: 0    spironolactone (ALDACTONE) 25 MG tablet Take 1 tablet (25 mg total) by mouth daily. Qty: 3 tablet, Refills: 0      CONTINUE these medications which have CHANGED   Details  albuterol (PROVENTIL HFA;VENTOLIN HFA) 108 (90 BASE) MCG/ACT inhaler Inhale 2 puffs into the lungs every 4 (four) hours as needed. For shortness of breath. Qty: 1 Inhaler, Refills: 3    amLODipine-olmesartan (AZOR) 10-40 MG per tablet Take 1 tablet by mouth daily. Qty: 3 tablet, Refills: 0    budesonide-formoterol (SYMBICORT) 160-4.5 MCG/ACT inhaler Inhale 2 puffs into the lungs 2 (two)  times daily. Qty: 1 Inhaler, Refills: 3    furosemide (LASIX) 40 MG tablet Take 1 tablet (40 mg total) by mouth 2 (two) times daily. Qty: 6 tablet, Refills: 0    ipratropium-albuterol (DUONEB) 0.5-2.5 (3) MG/3ML SOLN Take 3 mLs by nebulization every 6 (six) hours as needed. For shortness of breath. Qty: 360 mL, Refills: 2    lisinopril (PRINIVIL,ZESTRIL) 40 MG tablet Take 1 tablet (40 mg total) by mouth daily. Qty: 30 tablet, Refills: 2    pravastatin (PRAVACHOL) 20 MG tablet Take 1 tablet (20 mg total) by mouth daily. Qty: 30 tablet, Refills: 2    warfarin (COUMADIN) 5 MG tablet Take 1.5 tablets (7.5 mg total) by mouth daily. Alternate  between 5 MG and 7.5 MG. Qty: 45 tablet, Refills: 1      STOP taking these medications     cloNIDine (CATAPRES) 0.2 MG tablet         Discharge Orders    Future Orders Please Complete By Expires   Diet - low sodium heart healthy      Increase activity slowly      Discharge instructions      Comments:   Be sure to follow up with Dr. Jacinto Halim (Heart Doctor)      Follow-up Information    Follow up with [facility] (Appt is on 10/07/11 at 2pm)    Contact information:   [address]  [place] [address] [phone]       Follow up with REESE,BETTI D. Make an appointment in 4 weeks.   Contact information:   [address], [address] [place] [address] [phone]          Total Discharge Time: 35 mins  Signed: [facility] Pager [phone]  09/19/2011, 2:53 PM

## 2011-09-19 NOTE — Progress Notes (Signed)
09/19/2011 Fransico Michael Sullivan Case Management Note [phone]  HOME HEALTH AGENCIES SERVING [facility]   Agencies that are Medicare-Certified and are affiliated with [facility]  Telephone Number Address  [facility]   The [facility] has ownership interest in this company; however, you are under no obligation to use this agency. [phone] or  [phone] [address] [place], [place] [address]   Agencies that are Medicare-Certified and are not affiliated with [facility] Agency Telephone Number Address  [facility] [phone] Fax [phone] [address], [address] [place], [place]  [address]  [facility] [phone] or [phone] Fax [phone] [address] [place], [place] [address]  [facility] [phone] Fax [phone] [address] [place], [place] [address]  [facility] [phone] Fax [phone] [address] [place], [place]  [address]  [facility] [phone] Fax [phone] [address], [address] [place], [place] [address]  [facility] [phone] [address] [place], [place] [address]  [facility] [phone]  [address] [place], [place] [address]  [facility] [phone] or [phone] Fax [phone] [address] [place], [place]  [address]  [facility] [phone] Fax [phone] [address] [place], [place]  [address]  [facility]  [phone] Fax [phone] [address] [place], [place] [address]               Agencies that are not Medicare-Certified and are not affiliated with [facility] Agency Telephone Number Address  [facility] [phone] or [phone] Fax [phone] [address], [place]  [address]  [facility] [phone] Fax [phone] [address] [place], [place]  [address]  [facility]  [phone] Fax [phone] [address] [place], [place] [address]  [facility] [phone] Fax [phone] [address] [place], [place] [address]  [facility] [phone] or [phone] Fax [phone] [address] [place], [place]  [address]  [facility] [phone] or [phone] Fax [phone] [address], [address] [place], [place]  [address]  [facility] [phone] Fax [phone] [address] [place], [place]  [address]  [facility] [phone] [address] [place], [place]  [address]  [facility] [phone] Fax [phone] [address] [place], [place]  [address]  [facility] [phone] Fax [phone] [address] [place], [place]  [address]  [facility] [phone] Fax [phone] [address] [place], [place] [address]  [facility] [phone] Fax [phone] [address] [place], [place]  [address]   Choice of home health agencies offered to patient. [facility] chosen.

## 2011-09-19 NOTE — Progress Notes (Signed)
Pt had 14 beats NSVT while sitting up watching TV.  Pt denies CP/SOB or other c/o at this time.  BP 152/88 --strip posted on chart--will cont to monitor. Dierdre Highman

## 2011-09-19 NOTE — Progress Notes (Signed)
ANTICOAGULATION CONSULT NOTE - Follow Up Consult  Pharmacy Consult for Lovenox/Coumadin  Indication: History of PE and history of CVA  No Known Allergies  Patient Measurements: Height: 6' (182.9 cm) Weight: 339 lb 4.6 oz (153.9 kg) IBW/kg (Calculated) : 77.6   Vital Signs: Temp: 98.2 F (36.8 C) (12/10 0535) BP: 143/92 mmHg (12/10 0535) Pulse Rate: 98  (12/10 0535)  Labs:  Basename 09/19/11 0905 09/18/11 0715 09/17/11 0940 09/17/11 0500 09/16/11 2348  HGB -- -- -- -- 12.5*  HCT -- -- -- -- 42.2  PLT -- -- -- -- 409*  APTT -- -- -- -- --  LABPROT 18.0* 17.3* -- 18.2* --  INR 1.46 1.39 -- 1.48 --  HEPARINUNFRC -- -- -- -- --  CREATININE -- 1.08 1.08 -- --  CKTOTAL -- -- -- -- --  CKMB -- -- -- -- --  TROPONINI -- -- -- -- --    Medications:  Scheduled:    . amLODipine  10 mg Oral Daily  . antiseptic oral rinse  15 mL Mouth Rinse q12n4p  . budesonide-formoterol  2 puff Inhalation BID  . chlorhexidine  15 mL Mouth Rinse BID  . cloNIDine  0.3 mg Transdermal Weekly  . digoxin  0.25 mg Oral Daily  . enoxaparin (LOVENOX) injection  150 mg Subcutaneous Q12H  . furosemide  40 mg Oral BID  . guaiFENesin  600 mg Oral BID  . ipratropium  0.5 mg Nebulization Q6H  . levalbuterol  0.63 mg Nebulization Q6H  . lisinopril  40 mg Oral Daily  . metoprolol tartrate  12.5 mg Oral BID  . moxifloxacin  400 mg Oral q1800  . olmesartan  40 mg Oral Daily  . predniSONE  40 mg Oral Q breakfast  . simvastatin  5 mg Oral q1800  . sodium chloride  3 mL Intravenous Q12H  . spironolactone  25 mg Oral Daily  . warfarin  7.5 mg Oral ONCE-1800    Assessment: 55 year old male on chronic coumadin for history of PE, INR increased slightly, but still subtherapeutic. Pt. Is on full dose Lovenox until INR therapeutic.   Goal of Therapy:  INR 2-3   Plan:  1. Coumadin 7.5mg  po x 1 2. Continue lovenox 150mg  sq q 12hrs  Riki Rusk 09/19/2011,9:58 AM

## 2011-09-19 NOTE — Progress Notes (Signed)
Physical Therapy Evaluation Patient Details Name: Todd Sullivan MRN: [id number] DOB: 16-Aug-1956 Today's Date: 09/19/2011  Problem List:  Patient Active Problem List  Diagnoses  . DM  . HYPERLIPIDEMIA  . HYPERTENSION  . CONGESTIVE HEART FAILURE  . STROKE  . ALLERGIC RHINITIS  . EMPHYSEMA  . DYSPNEA  . Respiratory failure, acute  . COPD exacerbation  . Obesities, morbid  . VTE (venous thromboembolism)  . Gout attack  . Nicotine abuse  . Systolic and diastolic CHF, acute on chronic    Past Medical History:  Past Medical History  Diagnosis Date  . CHF (congestive heart failure)   . Diabetes mellitus   . Asthma   . Gout   . Shortness of breath   . Hypertension   . Stroke   . Headache   . Anxiety   . COPD (chronic obstructive pulmonary disease)   . Depression    Past Surgical History:  Past Surgical History  Procedure Date  . Eye surgery 55 years old    PT Assessment/Plan/Recommendation PT Assessment Clinical Impression Statement: Pt presents to [facility] with acute respiratory failure and a history of falls. Pt is generally deconditioned with decreased activity tolerance and minimal gait instability which is improved with a RW. Will benefit PT while in acute setting for improved balance and activity tolerance  so as to improve independence and safety at home. Will also recommend HHPT follow up for continued therapies.  PT Recommendation/Assessment: Patient will need skilled PT in the acute care venue PT Problem List: Decreased strength;Decreased activity tolerance;Decreased balance Barriers to Discharge: Decreased caregiver support PT Therapy Diagnosis : Abnormality of gait;Generalized weakness;Difficulty walking PT Plan PT Frequency: Min 3X/week PT Treatment/Interventions: Gait training;Stair training;Functional mobility training;DME instruction;Therapeutic activities;Therapeutic exercise;Balance training;Neuromuscular re-education;Patient/family education PT  Recommendation Follow Up Recommendations: Home health PT Equipment Recommended: Rolling walker with 5" wheels (wide RW/bariatric) PT Goals  Acute Rehab PT Goals PT Goal Formulation: With patient Pt will go Supine/Side to Sit: Independently PT Goal: Supine/Side to Sit - Progress: Progressing toward goal Pt will go Sit to Supine/Side: Independently PT Goal: Sit to Supine/Side - Progress: Progressing toward goal Pt will go Sit to Stand: with modified independence PT Goal: Sit to Stand - Progress: Progressing toward goal Pt will go Stand to Sit: with modified independence PT Goal: Stand to Sit - Progress: Progressing toward goal Pt will Ambulate: >150 feet;with modified independence;with least restrictive assistive device PT Goal: Ambulate - Progress: Progressing toward goal Pt will Go Up / Down Stairs: 3-5 stairs;with modified independence PT Goal: Up/Down Stairs - Progress: Progressing toward goal  PT Evaluation Precautions/Restrictions  Precautions Precautions: Fall Precaution Comments: Pt reports two recent falls, one on his steps at home and one where he fell asleep while standing Prior Functioning  Home Living Lives With: Alone Receives Help From: Family;Home health (CNA M-F for 3-4 hours) Type of Home: House Home Layout: One level Home Access: Stairs to enter Entrance Stairs-Rails: Right Entrance Stairs-Number of Steps: 3 Bathroom Shower/Tub: Tub/shower unit Home Adaptive Equipment: Straight cane Prior Function Level of Independence: Independent with basic ADLs;Independent with homemaking with ambulation;Independent with gait;Independent with transfers Driving: Yes Vocation: On disability Cognition Cognition Arousal/Alertness: Awake/alert Overall Cognitive Status: Appears within functional limits for tasks assessed Sensation/Coordination Sensation Light Touch: Not tested Extremity Assessment RUE Assessment RUE Assessment:  (defer to OT) LUE Assessment LUE  Assessment:  (defer to OT) RLE Strength RLE Overall Strength Comments: grossly 4/5  LLE Strength LLE Overall Strength Comments: grossly 4/5 tested functionally Mobility (  including Balance) Bed Mobility Bed Mobility: No (pt upright in recliner) Transfers Transfers: Yes Sit to Stand: From chair/3-in-1 Sit to Stand Details (indicate cue type and reason): mingaurdA; pt using hands on arm rests to assist; became dizzy initally upon standing but was able to self correct Stand to Sit: 5: Supervision;To chair/3-in-1 Stand to Sit Details: pt reaching back for arm rests on chair but with poorly controlled plop into recliner Ambulation/Gait Ambulation/Gait: Yes Ambulation/Gait Assistance: 5: Supervision Ambulation/Gait Assistance Details (indicate cue type and reason): amb approx 200 ft with RW; stubbed his toe on RW during turn; v/c's for safe technique with RW; towards end of ambulation pt flexing over RW and propping elbows on the handles secondary to decreased activity tolerance Ambulation Distance (Feet): 200 Feet Assistive device: Rolling walker Gait Pattern: Trunk flexed;Decreased hip/knee flexion - left;Decreased hip/knee flexion - right  Dynamic Standing Balance Dynamic Standing - Comments: dynamic standing not tested fully as pt used RW throughout session but without RW pt reaching for UE support and experienced a minor posterior LOB initially upon standing from chair Exercise    End of Session PT - End of Session Activity Tolerance: Patient tolerated treatment well;Patient limited by fatigue Patient left: in chair Nurse Communication: Mobility status for transfers;Mobility status for ambulation General Behavior During Session: [facility] for tasks performed Cognition: [facility] for tasks performed  [facility] HELEN 09/19/2011, 9:46 AM

## 2011-09-19 NOTE — Progress Notes (Signed)
Occupational Therapy Evaluation Patient Details Name: Todd Sullivan MRN: [id number] DOB: Oct 01, 1956 Today's Date: 09/19/2011  Problem List:  Patient Active Problem List  Diagnoses  . DM  . HYPERLIPIDEMIA  . HYPERTENSION  . CONGESTIVE HEART FAILURE  . STROKE  . ALLERGIC RHINITIS  . EMPHYSEMA  . DYSPNEA  . Respiratory failure, acute  . COPD exacerbation  . Obesities, morbid  . VTE (venous thromboembolism)  . Gout attack  . Nicotine abuse  . Systolic and diastolic CHF, acute on chronic    Past Medical History:  Past Medical History  Diagnosis Date  . CHF (congestive heart failure)   . Diabetes mellitus   . Asthma   . Gout   . Shortness of breath   . Hypertension   . Stroke   . Headache   . Anxiety   . COPD (chronic obstructive pulmonary disease)   . Depression    Past Surgical History:  Past Surgical History  Procedure Date  . Eye surgery 55 years old    OT Assessment/Plan/Recommendation OT Assessment Clinical Impression Statement: This 55 y.o. male presents to OT with generalized weakness and deconditioning and occasional LOB resulting in decreased independence with BADLs.  Feel pt would benefit from OT to maximize independence with ADLs to allow him to return home at Modified independent level using adaptive equipment. OT Recommendation/Assessment: Patient will need skilled OT in the acute care venue OT Problem List: Decreased strength;Decreased activity tolerance;Impaired balance (sitting and/or standing);Decreased knowledge of use of DME or AE;Cardiopulmonary status limiting activity Barriers to Discharge: None OT Therapy Diagnosis : Generalized weakness OT Plan OT Frequency: Min 2X/week OT Treatment/Interventions: Self-care/ADL training;DME and/or AE instruction;Therapeutic activities;Patient/family education;Balance training OT Recommendation Follow Up Recommendations: None Equipment Recommended: None recommended by OT Individuals Consulted Consulted  and Agree with Results and Recommendations: Patient OT Goals Acute Rehab OT Goals OT Goal Formulation: With patient Time For Goal Achievement: 2 weeks ADL Goals Pt Will Perform Grooming: with modified independence;Standing at sink Pt Will Perform Upper Body Bathing: with modified independence;Standing at sink Pt Will Perform Lower Body Bathing: with modified independence;Sit to stand from chair;Standing at sink;with adaptive equipment Pt Will Perform Lower Body Dressing: with modified independence;with adaptive equipment;Sit to stand from chair Pt Will Transfer to Toilet: with modified independence;Ambulation;Grab bars;Comfort height toilet Pt Will Perform Toileting - Clothing Manipulation: with modified independence;Standing Pt Will Perform Toileting - Hygiene: with modified independence;with adaptive equipment;Sit to stand from 3-in-1/toilet  OT Evaluation Precautions/Restrictions  Precautions Precautions: Fall Precaution Comments: Pt reports two recent falls, one on his steps at home and one where he fell asleep while standing Restrictions Weight Bearing Restrictions: No Prior Functioning Home Living Lives With: Alone Receives Help From: Family;Home health (CNA M-F for 3-4 hours) Type of Home: House Home Layout: One level Home Access: Stairs to enter Entrance Stairs-Rails: Right Entrance Stairs-Number of Steps: 3 Bathroom Shower/Tub: Buyer, retail: Yes How Accessible: Accessible via walker Home Adaptive Equipment: Shower chair with back;Grab bars in shower;Grab bars around toilet Prior Function Level of Independence: Needs assistance with ADLs;Needs assistance with homemaking Bath: Minimal (requires assistance to bathe feet) Toileting: Other (comment) (Pt. reports difficulty with peri-care) Dressing: Minimal (for feet) Able to Take Stairs?: Yes Driving: Yes Vocation: On disability Comments: Pt. reports that he is independent  with everything, but upon further questioning, reports difficulty with ADLs and reports he has an aid.  Pt. reports he becomes dyspneic when leaning forward to access feet ADL ADL Eating/Feeding: Simulated;Independent Where Assessed -  Eating/Feeding: Chair Grooming: Simulated;Minimal assistance;Wash/dry face;Teeth care (min guard assist) Where Assessed - Grooming: Standing at sink Upper Body Bathing: Simulated;Set up Where Assessed - Upper Body Bathing: Sitting, chair Lower Body Bathing: Simulated;Minimal assistance Lower Body Bathing Details (indicate cue type and reason): Pt. instructed in use of LH sponge for LB bathing Where Assessed - Lower Body Bathing: Sit to stand from chair Upper Body Dressing: Simulated;Set up Where Assessed - Upper Body Dressing: Sitting, chair Lower Body Dressing: Simulated;Performed;Minimal assistance (min assist without AE; supervision with AE) Lower Body Dressing Details (indicate cue type and reason): Pt. was instructed in use of reacher and sock aid for LB ADLs Where Assessed - Lower Body Dressing: Sit to stand from chair Toilet Transfer: Simulated;Minimal assistance (min guard assist.  Pt. with slight LOB with sit to stand) Toilet Transfer Method: Proofreader: Comfort height toilet;Grab bars Toileting - Clothing Manipulation: Simulated;Minimal assistance (min guard assist) Where Assessed - Glass blower/designer Manipulation: Standing Toileting - Hygiene: Simulated;Minimal assistance Where Assessed - Toileting Hygiene: Standing (Pt instructed in use and aquisition of toileting aid) Equipment Used: Long-handled sponge;Reacher;Sock aid;Rolling walker ADL Comments: Pt. limited with ADLs due to pt. girth. Pt. provided with reacher, sock aid, LH sponge from indigent closet.  Instructed him in use of toileting aid.  Pt. approximating baseline level of functioning, but fatigues quickly Vision/Perception     Cognition Cognition Arousal/Alertness: Awake/alert Overall Cognitive Status: Appears within functional limits for tasks assessed Sensation/Coordination Sensation Light Touch: Not tested Coordination Gross Motor Movements are Fluid and Coordinated: Yes Fine Motor Movements are Fluid and Coordinated: Yes Extremity Assessment RUE Assessment RUE Assessment: Within Functional Limits LUE Assessment LUE Assessment: Within Functional Limits Mobility  Bed Mobility Bed Mobility: No (pt upright in recliner) Transfers Transfers: Yes Sit to Stand: From chair/3-in-1 Sit to Stand Details (indicate cue type and reason): min guard assist due to LOB Stand to Sit: 5: Supervision;To chair/3-in-1 Stand to Sit Details: pt reaching back for arm rests on chair but with poorly controlled plop into recliner Exercises   End of Session OT - End of Session Activity Tolerance: Patient limited by fatigue Patient left: in chair;with call bell in reach General Behavior During Session: [facility] for tasks performed Cognition: [facility] for tasks performed   Terrilee Dudzik M 09/19/2011, 11:34 AM

## 2011-09-19 NOTE — Progress Notes (Signed)
Patient had 4 beats vtach this morning.  Patient asymptomatic, I was in room administering medications.  BP 155/98.  Dr. Rito Ehrlich paged and notified.  Will continue to monitor.  Colman Cater

## 2011-09-19 NOTE — Progress Notes (Signed)
Inpatient Diabetes Program Recommendations  AACE/ADA: New Consensus Statement on Inpatient Glycemic Control (2009)  Target Ranges:  Prepandial:   less than 140 mg/dL      Peak postprandial:   less than 180 mg/dL (1-2 hours)      Critically ill patients:  140 - 180 mg/dL   CBGs 40/98: 119/ 147/ 142/ 200 mg/dl  Inpatient Diabetes Program Recommendations Correction (SSI): Please start Novolog Sensitive correction scale (SSI) tid ac + HS.

## 2011-09-22 ENCOUNTER — Encounter: Payer: Self-pay | Admitting: Cardiology

## 2012-05-04 ENCOUNTER — Encounter (HOSPITAL_COMMUNITY): Payer: Self-pay

## 2012-05-04 ENCOUNTER — Ambulatory Visit (HOSPITAL_COMMUNITY)
Admission: RE | Admit: 2012-05-04 | Discharge: 2012-05-04 | Disposition: A | Discharge status: Home / Self Care | Payer: Medicare Other | Source: Ambulatory Visit | Attending: Internal Medicine | Admitting: Internal Medicine

## 2012-05-04 VITALS — BP 120/75 | HR 71 | Resp 16 | Ht 72.0 in | Wt 290.0 lb

## 2012-05-04 DIAGNOSIS — I10 Essential (primary) hypertension: Secondary | ICD-10-CM

## 2012-05-04 DIAGNOSIS — I5022 Chronic systolic (congestive) heart failure: Secondary | ICD-10-CM

## 2012-05-04 LAB — BASIC METABOLIC PANEL
CO2: 28 mEq/L (ref 19–32)
Calcium: 9.6 mg/dL (ref 8.4–10.5)
Chloride: 100 mEq/L (ref 96–112)
Sodium: 140 mEq/L (ref 135–145)

## 2012-05-04 LAB — T4, FREE: Free T4: 1.09 ng/dL (ref 0.80–1.80)

## 2012-05-04 LAB — TSH: TSH: 1.887 u[IU]/mL (ref 0.350–4.500)

## 2012-05-04 LAB — DIGOXIN LEVEL: Digoxin Level: 0.8 ng/mL (ref 0.8–2.0)

## 2012-05-04 MED ORDER — CARVEDILOL 25 MG PO TABS: 25.0000 mg | ORAL_TABLET | Freq: Two times a day (BID) | ORAL | Status: DC

## 2012-05-04 NOTE — Progress Notes (Signed)
Patient ID: Todd Sullivan, male   DOB: 1956/03/17, 56 y.o.   MRN: [id number]   Advanced Heart Failure Team Consult Note  Referring Physician: Dr. Jacinto Halim Primary Physician: Dr. Jeanella Anton Primary Cardiologist:  Dr. Jacinto Halim  Reason for Consultation: Assistance with HF management   HPI:     Todd Sullivan is a 56 y/o man with morbid obesity, DM2, OSA, moderate to severe COPD with ongoing tobacco use (previously seen by Dr. Shelle Iron), previous CVA x 2, HTN, h/o PE 2006, CHF with biventricular dysfunction due to presumed NICM EF previously 25% with RV dysfunction by ECHO 12/1. Echo 04/19/12 EF 35-40% Marked LVH. Normal RV function. No PAH  Referred by Dr. Jacinto Halim for assistance with HF management.   Was first diagnosed with HF in 2007 when he was in [place] and developed orthopnea - had 4 hospitalizations there.  Says he has never had a cath or other invasive procedures on his heart. Moved to [place] in 2009.   Admitted to [facility] in December 2012 with respiratory failure due to COPD and CHF in setting of severe HTN. Seen by Dr. Jacinto Halim. Improved with treatment. Stress test or cath recommended.    Echo - Left ventricle: The cavity size was mildly dilated. There was severe concentric hypertrophy. Systolic function was severely reduced. The estimated ejection fraction was in the range of 20% to 25%. Features are consistent with a pseudonormal left ventricular filling pattern, with concomitant abnormal relaxation and increased filling pressure (grade 2 diastolic dysfunction). Doppler parameters are consistent with both elevated ventricular end-diastolic filling pressure and elevated left atrial filling pressure. - Left atrium: The atrium was severely dilated. - Right ventricle: The cavity size was moderately decreased. Systolic function was moderately reduced. - Right atrium: The atrium was moderately dilated. - Atrial septum: No defect or patent foramen ovale was identified.  Says he his doing much better. Taking medicines  routinely. Has lost 100 pounds. Says he can walk 1/4 mile and has to stop due to SOB and leg pain. (has severe arthitis). Weighs himself every day. Typically 281 at home. Does not use sliding scale lasix. Trying to watch diet closely. Last HgBa1c 6.3. BP improved but still can go up at times to 180/125. No edema, orthopnea or PND.  Says he was supposed to go for sleep study but didn't go.      Review of Systems: [y] = yes, [ ]  = no   General: Weight gain [ ] ; Weight loss Cove[place]Etienne ]; Anorexia [ ] ; Fatigue [ ] ; Fever [ ] ; Chills [ ] ; Weakness [ ]   Cardiac: Chest pain/pressure [ ] ; Resting SOB [ ] ; Exertional SOB [place]ove.Etienne ]; Orthopnea [ ] ; Pedal Edema [ ] ; Palpitations [ ] ; Syncope [ ] ; Presyncope [ ] ; Paroxysmal nocturnal dyspnea[ ]   Pulmonary: Cough [place]ove.Etienne ]; Wheezing[ ] ; Hemoptysis[ ] ; Sputum [ ] ; Snoring [ ]   GI: Vomiting[ ] ; Dysphagia[ ] ; Melena[ ] ; Hematochezia [ ] ; Heartburn[ ] ; Abdominal pain [ ] ; Constipation [ ] ; Diarrhea [ ] ; BRBPR [ ]   GU: Hematuria[ ] ; Dysuria [ ] ; Nocturia[ ]   Vascular: Pain in legs with walking [ ] ; Pain in feet with lying flat [ ] ; Non-healing sores [ ] ; Stroke [ ] ; TIA [ ] ; Slurred speech [ ] ;  Neuro: Headaches[ ] ; Vertigo[ ] ; Seizures[ ] ; Paresthesias[ ] ;Blurred vision [ ] ; Diplopia [ ] ; Vision changes [ ]   Ortho/Skin: Arthritis [ ] ; Joint pain [place]ove.Etienne ]; Muscle pain [ ] ; Joint swelling [ ] ; Back Pain [ ] ; Rash [ ]   Psych: Depression[ ] ;  Anxiety[ ]   Heme: Bleeding problems [ ] ; Clotting disorders [ ] ; Anemia [ ]   Endocrine: Diabetes [ y]; Thyroid dysfunction[ ]   Home Medications Prior to Admission medications   Medication Sig Start Date End Date Taking? Authorizing Provider  albuterol (PROVENTIL HFA;VENTOLIN HFA) 108 (90 BASE) MCG/ACT inhaler Inhale 2 puffs into the lungs every 4 (four) hours as needed. For shortness of breath. 09/19/11  Yes Osvaldo Shipper, MD  budesonide-formoterol [facility]) 160-4.5 MCG/ACT inhaler Inhale 2 puffs into the lungs 2 (two) times daily. 09/19/11   Yes Osvaldo Shipper, MD  cloNIDine (CATAPRES) 0.2 MG tablet Take 1 tablet (0.2 mg total) by mouth 2 (two) times daily. 09/19/11 09/18/12 Yes Osvaldo Shipper, MD  colchicine (COLCRYS) 0.6 MG tablet Take 0.6 mg by mouth daily.   Yes Historical Provider, MD  digoxin (LANOXIN) 0.25 MG tablet Take 1 tablet (0.25 mg total) by mouth daily. 09/19/11 09/18/12 Yes Osvaldo Shipper, MD  furosemide (LASIX) 40 MG tablet Take 1 tablet (40 mg total) by mouth 2 (two) times daily. 09/19/11  Yes Osvaldo Shipper, MD  ipratropium-albuterol (DUONEB) 0.5-2.5 (3) MG/3ML SOLN Take 3 mLs by nebulization every 6 (six) hours as needed. For shortness of breath. 09/19/11  Yes Osvaldo Shipper, MD  lisinopril (PRINIVIL,ZESTRIL) 40 MG tablet Take 1 tablet (40 mg total) by mouth daily. 09/19/11  Yes Osvaldo Shipper, MD  metFORMIN (GLUCOPHAGE) 500 MG tablet Take 500 mg by mouth 2 (two) times daily with a meal.     Yes Historical Provider, MD  metoprolol (LOPRESSOR) 50 MG tablet Take 50 mg by mouth 2 (two) times daily.   Yes Historical Provider, MD  oxyCODONE-acetaminophen (PERCOCET) 10-325 MG per tablet Take 1 tablet by mouth every 4 (four) hours as needed.   Yes Historical Provider, MD  pravastatin (PRAVACHOL) 20 MG tablet Take 1 tablet (20 mg total) by mouth daily. 09/19/11  Yes Osvaldo Shipper, MD  spironolactone (ALDACTONE) 25 MG tablet Take 1 tablet (25 mg total) by mouth daily. 09/19/11 09/18/12 Yes Osvaldo Shipper, MD  warfarin (COUMADIN) 5 MG tablet Take 1.5 tablets (7.5 mg total) by mouth daily. Alternate between 5 MG and 7.5 MG. 09/19/11  Yes Osvaldo Shipper, MD    Past Medical History: Past Medical History  Diagnosis Date  . CHF (congestive heart failure)   . Diabetes mellitus   . Asthma   . Gout   . Shortness of breath   . Hypertension   . Stroke   . Headache   . Anxiety   . COPD (chronic obstructive pulmonary disease)   . Depression     Past Surgical History: Past Surgical History  Procedure Date  . Eye surgery 56  years old    Family History: Family History  Problem Relation Age of Onset  . Diabetes type II Mother   . Asthma Mother   . Diabetes type II Sister   . Asthma Sister   . Asthma Brother   . Asthma Son     Social History: History   Social History  . Marital Status: Legally Separated    Spouse Name: N/A    Number of Children: N/A  . Years of Education: N/A   Social History Main Topics  . Smoking status: Current Everyday Smoker -- 0.5 packs/day for 40 years    Types: Cigarettes  . Smokeless tobacco: None  . Alcohol Use: No  . Drug Use: No  . Sexually Active:    Other Topics Concern  . None   Social History Narrative  .  None    Allergies:  No Known Allergies  Objective:    Vital Signs:   Pulse Rate:  [71] 71  (07/26 0945) Resp:  [16] 16  (07/26 0945) BP: (120)/(75) 120/75 mmHg (07/26 0945) SpO2:  [97 %] 97 % (07/26 0945) Weight:  [290 lb (131.543 kg)] 290 lb (131.543 kg) (07/26 0945)    Weight change: Filed Weights   05/04/12 0945  Weight: 290 lb (131.543 kg)     Physical Exam: General:  Well appearing. No resp difficulty HEENT: normal Neck: supple. JVP . Carotids 2+ bilat; no bruits. No lymphadenopathy or thryomegaly appreciated. Cor: PMI nondisplaced. Regular rate & rhythm. No rubs, gallops or murmurs. Lungs: clear Abdomen: soft, nontender, nondistended. No hepatosplenomegaly. No bruits or masses. Good bowel sounds. Extremities: no cyanosis, clubbing, rash, edema Neuro: alert & orientedx3, cranial nerves grossly intact. moves all 4 extremities w/o difficulty. Affect pleasant   BNP: BNP (last 3 results)  Basename 09/17/11 0940 09/16/11 0555 09/14/11 1834  PROBNP 3111.0* 4958.0* 11455.0*

## 2012-05-04 NOTE — Patient Instructions (Addendum)
Stop Metoprolol Start Carvedilol 25 mg Twice daily   Your physician recommends that you schedule a follow-up appointment in: 1 month

## 2012-05-06 DIAGNOSIS — I5022 Chronic systolic (congestive) heart failure: Secondary | ICD-10-CM | POA: Insufficient documentation

## 2012-05-06 NOTE — Assessment & Plan Note (Signed)
Much improved but still elevated at times. Will change metoprolol to carvedilol 25 bid.

## 2012-05-06 NOTE — Assessment & Plan Note (Signed)
He has responded very well to therapy with Dr. Jacinto Halim. Volume status looks good. Functional status much improved (NYHA II). Long talk about need for daily weights and reviewed use of sliding scale diuretics. I gave him weight charts as well. Given HTN with change lopressor to carvedilol 25 bid. In reviewing his records I do not see any evidence of a formal ischemia evaluation completed though Dr. Jacinto Halim did suggest this while he was in the hospital. Todd Sullivan is not interested in a cath but a Myoview would probably be appropriate - though I suspect his CM is due to HTN and not severe CAD. I have contacted Dr. Jacinto Halim who will arrange.

## 2012-05-28 ENCOUNTER — Encounter (HOSPITAL_COMMUNITY): Payer: Self-pay | Admitting: *Deleted

## 2012-05-28 ENCOUNTER — Emergency Department (HOSPITAL_COMMUNITY)
Admission: EM | Admit: 2012-05-28 | Discharge: 2012-05-28 | Disposition: A | Discharge status: Home / Self Care | Payer: Medicare Other | Attending: Emergency Medicine | Admitting: Emergency Medicine

## 2012-05-28 DIAGNOSIS — Z8673 Personal history of transient ischemic attack (TIA), and cerebral infarction without residual deficits: Secondary | ICD-10-CM | POA: Insufficient documentation

## 2012-05-28 DIAGNOSIS — F17200 Nicotine dependence, unspecified, uncomplicated: Secondary | ICD-10-CM | POA: Insufficient documentation

## 2012-05-28 DIAGNOSIS — J4489 Other specified chronic obstructive pulmonary disease: Secondary | ICD-10-CM | POA: Insufficient documentation

## 2012-05-28 DIAGNOSIS — I509 Heart failure, unspecified: Secondary | ICD-10-CM | POA: Insufficient documentation

## 2012-05-28 DIAGNOSIS — J449 Chronic obstructive pulmonary disease, unspecified: Secondary | ICD-10-CM | POA: Insufficient documentation

## 2012-05-28 DIAGNOSIS — M109 Gout, unspecified: Secondary | ICD-10-CM | POA: Insufficient documentation

## 2012-05-28 DIAGNOSIS — Z760 Encounter for issue of repeat prescription: Secondary | ICD-10-CM | POA: Insufficient documentation

## 2012-05-28 DIAGNOSIS — I10 Essential (primary) hypertension: Secondary | ICD-10-CM | POA: Insufficient documentation

## 2012-05-28 DIAGNOSIS — E119 Type 2 diabetes mellitus without complications: Secondary | ICD-10-CM | POA: Insufficient documentation

## 2012-05-28 MED ORDER — WARFARIN - PHYSICIAN DOSING INPATIENT: Freq: Every day | Status: DC

## 2012-05-28 MED ORDER — SPIRONOLACTONE 25 MG PO TABS: 25.0000 mg | ORAL_TABLET | Freq: Every day | ORAL | Status: DC

## 2012-05-28 MED ORDER — LISINOPRIL 20 MG PO TABS
40.0000 mg | ORAL_TABLET | Freq: Once | ORAL | Status: AC
  Administered 2012-05-28: 40 mg via ORAL
  Filled 2012-05-28: qty 2

## 2012-05-28 MED ORDER — DIGOXIN 125 MCG PO TABS
0.2500 mg | ORAL_TABLET | Freq: Once | ORAL | Status: AC
  Administered 2012-05-28: 0.25 mg via ORAL
  Filled 2012-05-28: qty 1

## 2012-05-28 MED ORDER — SPIRONOLACTONE 25 MG PO TABS
25.0000 mg | ORAL_TABLET | Freq: Every day | ORAL | Status: DC
  Filled 2012-05-28: qty 1

## 2012-05-28 MED ORDER — DIGOXIN 250 MCG PO TABS: 0.2500 mg | ORAL_TABLET | Freq: Once | ORAL | Status: DC

## 2012-05-28 MED ORDER — HYDROMORPHONE HCL PF 1 MG/ML IJ SOLN
1.0000 mg | Freq: Once | INTRAMUSCULAR | Status: DC
  Filled 2012-05-28: qty 1

## 2012-05-28 MED ORDER — WARFARIN SODIUM 5 MG PO TABS
5.0000 mg | ORAL_TABLET | Freq: Once | ORAL | Status: AC
  Administered 2012-05-28: 5 mg via ORAL
  Filled 2012-05-28: qty 1

## 2012-05-28 MED ORDER — WARFARIN SODIUM 5 MG PO TABS: 5.0000 mg | ORAL_TABLET | Freq: Once | ORAL | Status: DC

## 2012-05-28 MED ORDER — METFORMIN HCL 500 MG PO TABS
500.0000 mg | ORAL_TABLET | Freq: Once | ORAL | Status: AC
  Administered 2012-05-28: 500 mg via ORAL
  Filled 2012-05-28: qty 1

## 2012-05-28 MED ORDER — COLCHICINE 0.6 MG PO TABS: 0.6000 mg | ORAL_TABLET | Freq: Every day | ORAL | Status: DC

## 2012-05-28 MED ORDER — PRAVASTATIN SODIUM 20 MG PO TABS: 20.0000 mg | ORAL_TABLET | Freq: Every day | ORAL | Status: DC

## 2012-05-28 MED ORDER — HYDROMORPHONE HCL PF 1 MG/ML IJ SOLN
1.0000 mg | Freq: Once | INTRAMUSCULAR | Status: AC
  Administered 2012-05-28: 1 mg via INTRAMUSCULAR

## 2012-05-28 MED ORDER — METFORMIN HCL 500 MG PO TABS: 500.0000 mg | ORAL_TABLET | Freq: Two times a day (BID) | ORAL | Status: DC

## 2012-05-28 MED ORDER — LISINOPRIL 40 MG PO TABS: 40.0000 mg | ORAL_TABLET | Freq: Once | ORAL | Status: DC

## 2012-05-28 MED ORDER — COLCHICINE 0.6 MG PO TABS
0.6000 mg | ORAL_TABLET | Freq: Once | ORAL | Status: AC
  Administered 2012-05-28: 0.6 mg via ORAL
  Filled 2012-05-28: qty 1

## 2012-05-28 NOTE — ED Provider Notes (Signed)
History     CSN: [id number]  Arrival date & time 05/28/12  4098   First MD Initiated Contact with Patient 05/28/12 [phone]      Chief Complaint  Patient presents with  . Gout    (Consider location/radiation/quality/duration/timing/severity/associated sxs/prior treatment) HPI Comments: Todd Sullivan is a pleasant, 56 year old with chronic medical conditions, who is currently out of multiple medications.  He has been to his physician twice to get prescriptions.  He gets them through mail order.  His physician checked again on Thursday, and a mail order supply her states, that his medications are on the way.  He is been currently without some of his medicines for the past 3, days He currently is experiencing increased pain in his left knee do, to doubt, which has been chronic and persistent for the past year  The history is provided by the patient.    Past Medical History  Diagnosis Date  . CHF (congestive heart failure)   . Diabetes mellitus   . Asthma   . Gout   . Shortness of breath   . Hypertension   . Stroke   . Headache   . Anxiety   . COPD (chronic obstructive pulmonary disease)   . Depression     Past Surgical History  Procedure Date  . Eye surgery 56 years old    Family History  Problem Relation Age of Onset  . Diabetes type II Mother   . Asthma Mother   . Diabetes type II Sister   . Asthma Sister   . Asthma Brother   . Asthma Son     History  Substance Use Topics  . Smoking status: Current Everyday Smoker -- 0.5 packs/day for 40 years    Types: Cigarettes  . Smokeless tobacco: Not on file  . Alcohol Use: No      Review of Systems  Constitutional: Negative for fever and chills.  Respiratory: Negative for shortness of breath.   Cardiovascular: Negative for chest pain.  Musculoskeletal: Positive for joint swelling.  Skin: Negative for wound.  Neurological: Negative for dizziness, weakness and headaches.    Allergies  Review of patient's allergies  indicates no known allergies.  Home Medications   Current Outpatient Rx  Name Route Sig Dispense Refill  . ALBUTEROL SULFATE HFA 108 (90 BASE) MCG/ACT IN AERS Inhalation Inhale 2 puffs into the lungs every 4 (four) hours as needed. For shortness of breath. 1 Inhaler 3  . BUDESONIDE-FORMOTEROL FUMARATE 160-4.5 MCG/ACT IN AERO Inhalation Inhale 2 puffs into the lungs 2 (two) times daily. 1 Inhaler 3  . CARVEDILOL 25 MG PO TABS Oral Take 1 tablet (25 mg total) by mouth 2 (two) times daily with a meal. 60 tablet 6  . CLONIDINE HCL 0.2 MG PO TABS Oral Take 1 tablet (0.2 mg total) by mouth 2 (two) times daily.    . COLCHICINE 0.6 MG PO TABS Oral Take 0.6 mg by mouth daily.    . CYCLOBENZAPRINE HCL 10 MG PO TABS Oral Take 10 mg by mouth 3 (three) times daily.    Marland Kitchen DIGOXIN 0.25 MG PO TABS Oral Take 1 tablet (0.25 mg total) by mouth daily. 3 tablet 0  . FUROSEMIDE 40 MG PO TABS Oral Take 1 tablet (40 mg total) by mouth 2 (two) times daily. 6 tablet 0  . IPRATROPIUM-ALBUTEROL 0.5-2.5 (3) MG/3ML IN SOLN Nebulization Take 3 mLs by nebulization every 6 (six) hours as needed. For shortness of breath. 360 mL 2  . LISINOPRIL 40 MG  PO TABS Oral Take 1 tablet (40 mg total) by mouth daily. 30 tablet 2  . METFORMIN HCL 500 MG PO TABS Oral Take 500 mg by mouth 2 (two) times daily with a meal.      . OXYCODONE-ACETAMINOPHEN 10-325 MG PO TABS Oral Take 1 tablet by mouth every 4 (four) hours as needed.    Marland Kitchen PRAVASTATIN SODIUM 20 MG PO TABS Oral Take 1 tablet (20 mg total) by mouth daily. 30 tablet 2  . SPIRONOLACTONE 25 MG PO TABS Oral Take 1 tablet (25 mg total) by mouth daily. 3 tablet 0  . WARFARIN SODIUM 5 MG PO TABS Oral Take 5-7.5 mg by mouth See admin instructions. Alternate between 5 MG and 7.5 MG every other day. Last dose of 5mg  on 05/27/12    . COLCHICINE 0.6 MG PO TABS Oral Take 1 tablet (0.6 mg total) by mouth daily. 30 tablet 0  . DIGOXIN 0.25 MG PO TABS Oral Take 1 tablet (0.25 mg total) by mouth once.  30 tablet 0  . LISINOPRIL 40 MG PO TABS Oral Take 1 tablet (40 mg total) by mouth once. 30 tablet 0  . METFORMIN HCL 500 MG PO TABS Oral Take 1 tablet (500 mg total) by mouth 2 (two) times daily with a meal. 60 tablet 0  . PRAVASTATIN SODIUM 20 MG PO TABS Oral Take 1 tablet (20 mg total) by mouth daily. 30 tablet 0  . SPIRONOLACTONE 25 MG PO TABS Oral Take 1 tablet (25 mg total) by mouth daily. 30 tablet 0  . WARFARIN SODIUM 5 MG PO TABS Oral Take 1 tablet (5 mg total) by mouth once. 45 tablet 0    BP 179/90  Pulse 77  Temp 98.5 F (36.9 C) (Oral)  Resp 18  SpO2 99%  Physical Exam  Constitutional: He appears well-developed.  HENT:  Head: Normocephalic.  Eyes: Pupils are equal, round, and reactive to light.  Neck: Normal range of motion.  Cardiovascular: Normal rate and regular rhythm.        Hypertensive  Pulmonary/Chest: Effort normal. He has no wheezes. He has no rales.  Musculoskeletal: He exhibits edema and tenderness.       Left knee  Skin: Skin is warm and dry.    ED Course  Procedures (including critical care time)  Labs Reviewed - No data to display No results found.   1. Medication refill       MDM  I will refill the most important medications for one month prescription.  Hopefully, this patient will be receiving his home delivery from [facility] home delivery.  Pharmacy in the meantime.  I will give him one dose of these medications in the emergency department, as well as additional pain control for his gout exacerbation         Arman Filter, NP 05/28/12 1610  Arman Filter, NP 05/28/12 [phone]

## 2012-05-28 NOTE — ED Notes (Signed)
ON going problem with gout in left ankle and foot for one month but worse the past few days.  Waiting for meds to come in the mail

## 2012-05-28 NOTE — ED Provider Notes (Signed)
Medical screening examination/treatment/procedure(s) were performed by non-physician practitioner and as supervising physician I was immediately available for consultation/collaboration.  Jasmine Awe, MD 05/28/12 [phone]

## 2012-06-06 ENCOUNTER — Encounter (HOSPITAL_COMMUNITY): Payer: Self-pay

## 2012-06-06 ENCOUNTER — Ambulatory Visit (HOSPITAL_COMMUNITY)
Admission: RE | Admit: 2012-06-06 | Discharge: 2012-06-06 | Disposition: A | Discharge status: Home / Self Care | Payer: Medicare Other | Source: Ambulatory Visit | Attending: Internal Medicine | Admitting: Internal Medicine

## 2012-06-06 VITALS — BP 132/80 | HR 69 | Wt 288.2 lb

## 2012-06-06 DIAGNOSIS — I5022 Chronic systolic (congestive) heart failure: Secondary | ICD-10-CM | POA: Insufficient documentation

## 2012-06-06 DIAGNOSIS — I10 Essential (primary) hypertension: Secondary | ICD-10-CM

## 2012-06-06 NOTE — Patient Instructions (Addendum)
We will contact you in 3 months to schedule your next appointment.  

## 2012-06-06 NOTE — Assessment & Plan Note (Addendum)
He is doing quite well. Volume status stable.  NYHA II. He is compliant with meds and weight monitoring. Will continue current therapy. Discussed sliding scale diuretic use. Cardiomyopathy has been presumed nonischemic. Has stress test next month with Dr. Jacinto Halim to further evaluate.

## 2012-06-06 NOTE — Assessment & Plan Note (Addendum)
Controlled, continue current therapy. Given LV dysfunction would be nice to wean off clonidine if we can control HTN without it. Would be reasonable to consider hydralazine/NTG down the road.

## 2012-06-09 NOTE — Progress Notes (Signed)
Primary Physician: Dr. Jeanella Anton  Primary Cardiologist: Dr. Jacinto Halim  Weight Range  288-290 pounds  Baseline proBNP   3111 on 09/17/11    HPI: Todd Sullivan is a 56 y/o man with morbid obesity, DM2, OSA, moderate to severe COPD with ongoing tobacco use (previously seen by Dr. Shelle Iron), previous CVA x 2, HTN, h/o PE 2006, CHF with biventricular dysfunction due to presumed NICM, EF previously 25% with RV dysfunction by ECHO 09/2011.  Echo 04/19/12 EF 35-40% Marked LVH. Normal RV function. No PAH.  He was referred by Dr. Jacinto Halim for assistance with HF management.   Was first diagnosed with HF in 2007 when he was in [place] and developed orthopnea - had 4 hospitalizations there. Says he has never had a cath or other invasive procedures on his heart. Moved to [place] in 2009.  Was admitted to [facility] in December 2012 with respiratory failure due to COPD and CHF in setting of severe HTN. Seen by Dr. Jacinto Halim. Improved with treatment. Stress test or cath recommended.  Echo at the time showed mildly dilated LV, LVEF 20-25% with severe concentric hypertrophy.  Pseudonormal filling patter.  LA severly dilated.  RV systolic function moderately reduced.  RA mod dilated.     He returns for follow up today.  He feels great.  Weight stable at home 278-283 pounds.  He continues to walk ~1/4 mile prior to dyspnea.  He is watching is diet most days, did have pie/cake last night.  No edema, orthopnea or PND.  Has stress test scheduled for the end of Sept.  BP well controlled   ROS: All systems negative except as listed in HPI, PMH and Problem List.  Past Medical History  Diagnosis Date  . CHF (congestive heart failure)   . Diabetes mellitus   . Asthma   . Gout   . Shortness of breath   . Hypertension   . Stroke   . Headache   . Anxiety   . COPD (chronic obstructive pulmonary disease)   . Depression     Current Outpatient Prescriptions  Medication Sig Dispense Refill  . albuterol (PROVENTIL HFA;VENTOLIN HFA) 108 (90 BASE) MCG/ACT  inhaler Inhale 2 puffs into the lungs every 4 (four) hours as needed. For shortness of breath.  1 Inhaler  3  . budesonide-formoterol (SYMBICORT) 160-4.5 MCG/ACT inhaler Inhale 2 puffs into the lungs 2 (two) times daily.  1 Inhaler  3  . carvedilol (COREG) 25 MG tablet Take 1 tablet (25 mg total) by mouth 2 (two) times daily with a meal.  60 tablet  6  . cloNIDine (CATAPRES) 0.2 MG tablet Take 1 tablet (0.2 mg total) by mouth 2 (two) times daily.      . colchicine 0.6 MG tablet Take 1 tablet (0.6 mg total) by mouth daily.  30 tablet  0  . cyclobenzaprine (FLEXERIL) 10 MG tablet Take 10 mg by mouth 3 (three) times daily.      . digoxin (LANOXIN) 0.25 MG tablet Take 1 tablet (0.25 mg total) by mouth daily.  3 tablet  0  . furosemide (LASIX) 40 MG tablet Take 1 tablet (40 mg total) by mouth 2 (two) times daily.  6 tablet  0  . ipratropium-albuterol (DUONEB) 0.5-2.5 (3) MG/3ML SOLN Take 3 mLs by nebulization every 6 (six) hours as needed. For shortness of breath.  360 mL  2  . lisinopril (PRINIVIL,ZESTRIL) 40 MG tablet Take 1 tablet (40 mg total) by mouth daily.  30 tablet  2  . metFORMIN (  GLUCOPHAGE) 500 MG tablet Take 500 mg by mouth 2 (two) times daily with a meal.        . oxyCODONE-acetaminophen (PERCOCET) 10-325 MG per tablet Take 1 tablet by mouth every 4 (four) hours as needed.      . pravastatin (PRAVACHOL) 20 MG tablet Take 1 tablet (20 mg total) by mouth daily.  30 tablet  2  . spironolactone (ALDACTONE) 25 MG tablet Take 1 tablet (25 mg total) by mouth daily.  3 tablet  0  . warfarin (COUMADIN) 5 MG tablet Take 5-7.5 mg by mouth See admin instructions. Alternate between 5 MG and 7.5 MG every other day. Last dose of 5mg  on 05/27/12         PHYSICAL EXAM: Filed Vitals:   06/06/12 0841  BP: 132/80  Pulse: 69  Weight: 288 lb 4 oz (130.749 kg)  SpO2: 98%    General:  Well appearing. No resp difficulty HEENT: normal Neck: supple. JVP flat. Carotids 2+ bilaterally; no bruits. No  lymphadenopathy or thryomegaly appreciated. Cor: PMI normal. Regular rate & rhythm. No rubs, gallops or murmurs. Lungs: clear Abdomen: soft, obese. nontender, nondistended. Peri Jefferson bowel sounds. Extremities: no cyanosis, clubbing, rash, trace edema Neuro: alert & orientedx3, cranial nerves grossly intact. Moves all 4 extremities w/o difficulty. Affect pleasant.      ASSESSMENT & PLAN:

## 2012-09-17 ENCOUNTER — Encounter (HOSPITAL_COMMUNITY): Payer: Self-pay

## 2012-09-17 ENCOUNTER — Ambulatory Visit (HOSPITAL_COMMUNITY)
Admission: RE | Admit: 2012-09-17 | Discharge: 2012-09-17 | Disposition: A | Discharge status: Home / Self Care | Payer: Medicare Other | Source: Ambulatory Visit | Attending: Internal Medicine | Admitting: Internal Medicine

## 2012-09-17 VITALS — BP 104/68 | HR 68 | Wt 303.5 lb

## 2012-09-17 DIAGNOSIS — I10 Essential (primary) hypertension: Secondary | ICD-10-CM | POA: Insufficient documentation

## 2012-09-17 DIAGNOSIS — I5022 Chronic systolic (congestive) heart failure: Secondary | ICD-10-CM | POA: Insufficient documentation

## 2012-09-17 NOTE — Patient Instructions (Addendum)
Cut clonidine back to 0.1 mg twice a day.  Follow up 6 months.  Continue taking medications as prescribed.

## 2012-09-17 NOTE — Assessment & Plan Note (Signed)
EF has recovered by Myoview. Clinically doing very well. No volume overload. Reinforced need for daily weights and reviewed use of sliding scale diuretics.

## 2012-09-17 NOTE — Progress Notes (Signed)
Primary Cardiologist: Dr. Jacinto Halim  Weight Range  288-290 lbs  Baseline proBNP  3111 on 12/12    HPI: Todd Sullivan is a 56 y/o man with morbid obesity, DM2, OSA, moderate to severe COPD with ongoing tobacco use (previously seen by Dr. Shelle Iron), previous CVA x 2, HTN, h/o PE 2006, CHF with biventricular dysfunction due to presumed NICM, EF previously 25% with RV dysfunction by ECHO 09/2011. Echo 04/19/12 EF 35-40% Marked LVH. Normal RV function. No PAH. He was referred by Dr. Jacinto Halim for assistance with HF management.   Was first diagnosed with HF in 2007 when he was in [place] and developed orthopnea - had 4 hospitalizations there. Says he has never had a cath or other invasive procedures on his heart. Moved to [place] in 2009. Was admitted to [facility] in December 2012 with respiratory failure due to COPD and CHF in setting of severe HTN. Seen by Dr. Jacinto Halim. Improved with treatment. Stress test or cath recommended. Echo at the time showed mildly dilated LV, LVEF 20-25% with severe concentric hypertrophy. Pseudonormal filling pattern. LA severly dilated. RV systolic function moderately reduced. RA mod dilated.   Follow up: Had stress test with Dr. Jacinto Halim 06/2012 which showed EF approx 58% with normal wall motion, endocardial thickening, and prominent RV. There was mild inferior and infero-apical ischemia vs attenuation artifact. Treated medically. Taking medications as prescribed. Weighing daily and ranges from 288-293lbs. Denies orthopnea/CP/ DOE or edema. Meds (clonidine) make him tired especially in am.   ROS: All systems negative except as listed in HPI, PMH and Problem List.  Past Medical History  Diagnosis Date  . CHF (congestive heart failure)   . Diabetes mellitus   . Asthma   . Gout   . Shortness of breath   . Hypertension   . Stroke   . Headache   . Anxiety   . COPD (chronic obstructive pulmonary disease)   . Depression     Current Outpatient Prescriptions  Medication Sig Dispense Refill  . albuterol  (PROVENTIL HFA;VENTOLIN HFA) 108 (90 BASE) MCG/ACT inhaler Inhale 2 puffs into the lungs every 4 (four) hours as needed. For shortness of breath.  1 Inhaler  3  . budesonide-formoterol (SYMBICORT) 160-4.5 MCG/ACT inhaler Inhale 2 puffs into the lungs 2 (two) times daily.  1 Inhaler  3  . carvedilol (COREG) 25 MG tablet Take 1 tablet (25 mg total) by mouth 2 (two) times daily with a meal.  60 tablet  6  . cloNIDine (CATAPRES) 0.2 MG tablet Take 1 tablet (0.2 mg total) by mouth 2 (two) times daily.      . colchicine 0.6 MG tablet Take 1 tablet (0.6 mg total) by mouth daily.  30 tablet  0  . cyclobenzaprine (FLEXERIL) 10 MG tablet Take 10 mg by mouth 3 (three) times daily.      . digoxin (LANOXIN) 0.25 MG tablet Take 1 tablet (0.25 mg total) by mouth daily.  3 tablet  0  . furosemide (LASIX) 40 MG tablet Take 1 tablet (40 mg total) by mouth 2 (two) times daily.  6 tablet  0  . ipratropium-albuterol (DUONEB) 0.5-2.5 (3) MG/3ML SOLN Take 3 mLs by nebulization every 6 (six) hours as needed. For shortness of breath.  360 mL  2  . lisinopril (PRINIVIL,ZESTRIL) 40 MG tablet Take 1 tablet (40 mg total) by mouth daily.  30 tablet  2  . metFORMIN (GLUCOPHAGE) 500 MG tablet Take 500 mg by mouth 2 (two) times daily with a meal.        .  pravastatin (PRAVACHOL) 20 MG tablet Take 1 tablet (20 mg total) by mouth daily.  30 tablet  2  . Rivaroxaban (XARELTO) 20 MG TABS Take 20 mg by mouth daily.      Marland Kitchen spironolactone (ALDACTONE) 25 MG tablet Take 1 tablet (25 mg total) by mouth daily.  3 tablet  0  . oxyCODONE-acetaminophen (PERCOCET) 10-325 MG per tablet Take 1 tablet by mouth every 4 (four) hours as needed.         PHYSICAL EXAM: Filed Vitals:   09/17/12 0940  BP: 104/68  Pulse: 68  Weight: 303 lb 8 oz (137.667 kg)  SpO2: 98%    General:  Well appearing. No resp difficulty HEENT: normal Neck: supple. JVP flat Carotids 2+ bilaterally; no bruits. No lymphadenopathy or thryomegaly appreciated. Cor: PMI  normal. Distant heart sounds; Regular rate & rhythm. No rubs, gallops or murmurs. Lungs: CTA  Abdomen: obese soft, nontender, nondistended.Good bowel sounds. Extremities: no cyanosis, clubbing, rash, trace edema Neuro: alert & orientedx3, cranial nerves grossly intact. Moves all 4 extremities w/o difficulty. Affect pleasant.    ASSESSMENT & PLAN:

## 2012-09-17 NOTE — Assessment & Plan Note (Signed)
BP low and symptomatic. Will decrease clonidine to 0.1 bid. He will continue to follow BP closely. If BP going back up can increase. He will follow with Dr. Jacinto Halim.

## 2013-01-04 ENCOUNTER — Other Ambulatory Visit: Payer: Self-pay | Admitting: Cardiology

## 2013-01-04 MED ORDER — CARVEDILOL 25 MG PO TABS: 25.0000 mg | ORAL_TABLET | Freq: Two times a day (BID) | ORAL | Status: AC

## 2013-02-16 ENCOUNTER — Encounter (HOSPITAL_COMMUNITY): Payer: Self-pay | Admitting: Cardiology

## 2013-02-16 ENCOUNTER — Inpatient Hospital Stay (HOSPITAL_COMMUNITY)
Admission: EM | Admit: 2013-02-16 | Discharge: 2013-02-19 | DRG: 638 | Disposition: A | Discharge status: Home / Self Care | Payer: Medicare Other | Attending: Internal Medicine | Admitting: Internal Medicine

## 2013-02-16 DIAGNOSIS — Z7901 Long term (current) use of anticoagulants: Secondary | ICD-10-CM

## 2013-02-16 DIAGNOSIS — E785 Hyperlipidemia, unspecified: Secondary | ICD-10-CM | POA: Diagnosis present

## 2013-02-16 DIAGNOSIS — N179 Acute kidney failure, unspecified: Secondary | ICD-10-CM | POA: Diagnosis present

## 2013-02-16 DIAGNOSIS — E1101 Type 2 diabetes mellitus with hyperosmolarity with coma: Principal | ICD-10-CM | POA: Diagnosis present

## 2013-02-16 DIAGNOSIS — J309 Allergic rhinitis, unspecified: Secondary | ICD-10-CM

## 2013-02-16 DIAGNOSIS — E1110 Type 2 diabetes mellitus with ketoacidosis without coma: Secondary | ICD-10-CM

## 2013-02-16 DIAGNOSIS — I10 Essential (primary) hypertension: Secondary | ICD-10-CM | POA: Diagnosis present

## 2013-02-16 DIAGNOSIS — J9600 Acute respiratory failure, unspecified whether with hypoxia or hypercapnia: Secondary | ICD-10-CM

## 2013-02-16 DIAGNOSIS — Z86718 Personal history of other venous thrombosis and embolism: Secondary | ICD-10-CM

## 2013-02-16 DIAGNOSIS — I5032 Chronic diastolic (congestive) heart failure: Secondary | ICD-10-CM | POA: Diagnosis present

## 2013-02-16 DIAGNOSIS — N289 Disorder of kidney and ureter, unspecified: Secondary | ICD-10-CM

## 2013-02-16 DIAGNOSIS — Z6838 Body mass index (BMI) 38.0-38.9, adult: Secondary | ICD-10-CM

## 2013-02-16 DIAGNOSIS — I5022 Chronic systolic (congestive) heart failure: Secondary | ICD-10-CM | POA: Diagnosis present

## 2013-02-16 DIAGNOSIS — R0602 Shortness of breath: Secondary | ICD-10-CM

## 2013-02-16 DIAGNOSIS — A5909 Other urogenital trichomoniasis: Secondary | ICD-10-CM | POA: Diagnosis present

## 2013-02-16 DIAGNOSIS — J441 Chronic obstructive pulmonary disease with (acute) exacerbation: Secondary | ICD-10-CM

## 2013-02-16 DIAGNOSIS — E119 Type 2 diabetes mellitus without complications: Secondary | ICD-10-CM | POA: Diagnosis present

## 2013-02-16 DIAGNOSIS — F17200 Nicotine dependence, unspecified, uncomplicated: Secondary | ICD-10-CM | POA: Diagnosis present

## 2013-02-16 DIAGNOSIS — I509 Heart failure, unspecified: Secondary | ICD-10-CM

## 2013-02-16 DIAGNOSIS — I6350 Cerebral infarction due to unspecified occlusion or stenosis of unspecified cerebral artery: Secondary | ICD-10-CM

## 2013-02-16 DIAGNOSIS — Z79899 Other long term (current) drug therapy: Secondary | ICD-10-CM

## 2013-02-16 DIAGNOSIS — I749 Embolism and thrombosis of unspecified artery: Secondary | ICD-10-CM

## 2013-02-16 DIAGNOSIS — I8290 Acute embolism and thrombosis of unspecified vein: Secondary | ICD-10-CM | POA: Diagnosis present

## 2013-02-16 DIAGNOSIS — E1100 Type 2 diabetes mellitus with hyperosmolarity without nonketotic hyperglycemic-hyperosmolar coma (NKHHC): Secondary | ICD-10-CM | POA: Diagnosis present

## 2013-02-16 DIAGNOSIS — I5043 Acute on chronic combined systolic (congestive) and diastolic (congestive) heart failure: Secondary | ICD-10-CM

## 2013-02-16 DIAGNOSIS — Z8673 Personal history of transient ischemic attack (TIA), and cerebral infarction without residual deficits: Secondary | ICD-10-CM

## 2013-02-16 DIAGNOSIS — Z720 Tobacco use: Secondary | ICD-10-CM

## 2013-02-16 DIAGNOSIS — J438 Other emphysema: Secondary | ICD-10-CM | POA: Diagnosis present

## 2013-02-16 DIAGNOSIS — M109 Gout, unspecified: Secondary | ICD-10-CM

## 2013-02-16 DIAGNOSIS — E860 Dehydration: Secondary | ICD-10-CM | POA: Diagnosis present

## 2013-02-16 LAB — COMPREHENSIVE METABOLIC PANEL WITH GFR
AST: 16 U/L (ref 0–37)
BUN: 37 mg/dL — ABNORMAL HIGH (ref 6–23)
CO2: 21 meq/L (ref 19–32)
Chloride: 88 meq/L — ABNORMAL LOW (ref 96–112)
Creatinine, Ser: 2.45 mg/dL — ABNORMAL HIGH (ref 0.50–1.35)
GFR calc Af Amer: 32 mL/min — ABNORMAL LOW (ref >90)
GFR calc non Af Amer: 28 mL/min — ABNORMAL LOW (ref >90)
Glucose, Bld: 626 mg/dL (ref 70–99)
Total Bilirubin: 0.6 mg/dL (ref 0.3–1.2)

## 2013-02-16 LAB — URINE MICROSCOPIC-ADD ON

## 2013-02-16 LAB — URINALYSIS, ROUTINE W REFLEX MICROSCOPIC
Bilirubin Urine: NEGATIVE
Glucose, UA: >1000 mg/dL — AB
Ketones, ur: 15 mg/dL — AB
Nitrite: NEGATIVE
Protein, ur: NEGATIVE mg/dL
Specific Gravity, Urine: 1.03 (ref 1.005–1.030)
Urobilinogen, UA: 0.2 mg/dL (ref 0.0–1.0)
pH: 5.5 (ref 5.0–8.0)

## 2013-02-16 LAB — BASIC METABOLIC PANEL
BUN: 33 mg/dL — ABNORMAL HIGH (ref 6–23)
CO2: 22 mEq/L (ref 19–32)
CO2: 24 mEq/L (ref 19–32)
Calcium: 10.3 mg/dL (ref 8.4–10.5)
Chloride: 94 mEq/L — ABNORMAL LOW (ref 96–112)
Chloride: 98 mEq/L (ref 96–112)
Chloride: 100 mEq/L (ref 96–112)
GFR calc Af Amer: 43 mL/min — ABNORMAL LOW (ref >90)
GFR calc non Af Amer: 36 mL/min — ABNORMAL LOW (ref >90)
Glucose, Bld: 326 mg/dL — ABNORMAL HIGH (ref 70–99)
Potassium: 4.2 mEq/L (ref 3.5–5.1)
Potassium: 3.9 mEq/L (ref 3.5–5.1)
Potassium: 3.7 mEq/L (ref 3.5–5.1)
Sodium: 129 mEq/L — ABNORMAL LOW (ref 135–145)
Sodium: 133 mEq/L — ABNORMAL LOW (ref 135–145)

## 2013-02-16 LAB — CBC
HCT: 42.3 % (ref 39.0–52.0)
HCT: 42.3 % (ref 39.0–52.0)
Hemoglobin: 15 g/dL (ref 13.0–17.0)
Hemoglobin: 14.7 g/dL (ref 13.0–17.0)
MCH: 27.9 pg (ref 26.0–34.0)
MCHC: 35.5 g/dL (ref 30.0–36.0)
MCHC: 34.8 g/dL (ref 30.0–36.0)
MCV: 78.8 fL (ref 78.0–100.0)
Platelets: 337 10*3/uL (ref 150–400)
RBC: 5.37 MIL/uL (ref 4.22–5.81)
RDW: 13.2 % (ref 11.5–15.5)
WBC: 10.5 K/uL (ref 4.0–10.5)
WBC: 12.3 10*3/uL — ABNORMAL HIGH (ref 4.0–10.5)

## 2013-02-16 LAB — GLUCOSE, CAPILLARY
Glucose-Capillary: >600 mg/dL (ref 70–99)
Glucose-Capillary: >600 mg/dL (ref 70–99)
Glucose-Capillary: 530 mg/dL — ABNORMAL HIGH (ref 70–99)
Glucose-Capillary: 337 mg/dL — ABNORMAL HIGH (ref 70–99)
Glucose-Capillary: 362 mg/dL — ABNORMAL HIGH (ref 70–99)
Glucose-Capillary: 151 mg/dL — ABNORMAL HIGH (ref 70–99)

## 2013-02-16 LAB — HEMOGLOBIN A1C
Hgb A1c MFr Bld: 10.7 % — ABNORMAL HIGH (ref <5.7)
Mean Plasma Glucose: 260 mg/dL — ABNORMAL HIGH (ref <117)

## 2013-02-16 LAB — COMPREHENSIVE METABOLIC PANEL
ALT: 21 U/L (ref 0–53)
Albumin: 4.1 g/dL (ref 3.5–5.2)
Alkaline Phosphatase: 83 U/L (ref 39–117)
Calcium: 11.2 mg/dL — ABNORMAL HIGH (ref 8.4–10.5)
Potassium: 4.8 mEq/L (ref 3.5–5.1)
Sodium: 125 mEq/L — ABNORMAL LOW (ref 135–145)
Total Protein: 7.7 g/dL (ref 6.0–8.3)

## 2013-02-16 MED ORDER — SODIUM CHLORIDE 0.9 % IV SOLN
INTRAVENOUS | Status: DC
  Administered 2013-02-16: 8.3 [IU]/h via INTRAVENOUS
  Administered 2013-02-16: 6.5 [IU]/h via INTRAVENOUS
  Administered 2013-02-16: 8.3 [IU]/h via INTRAVENOUS
  Filled 2013-02-16: qty 1

## 2013-02-16 MED ORDER — DEXTROSE 50 % IV SOLN: 25.0000 mL | INTRAVENOUS | Status: DC | PRN

## 2013-02-16 MED ORDER — CLONIDINE HCL 0.1 MG PO TABS
0.1000 mg | ORAL_TABLET | Freq: Two times a day (BID) | ORAL | Status: DC
  Administered 2013-02-17 – 2013-02-19 (×5): 0.1 mg via ORAL
  Filled 2013-02-16 (×7): qty 1

## 2013-02-16 MED ORDER — INSULIN REGULAR BOLUS VIA INFUSION
0.0000 [IU] | Freq: Three times a day (TID) | INTRAVENOUS | Status: DC
  Filled 2013-02-16: qty 10

## 2013-02-16 MED ORDER — CYCLOBENZAPRINE HCL 10 MG PO TABS: 10.0000 mg | ORAL_TABLET | Freq: Two times a day (BID) | ORAL | Status: DC | PRN

## 2013-02-16 MED ORDER — OXYCODONE HCL 5 MG PO TABS: 5.0000 mg | ORAL_TABLET | Freq: Four times a day (QID) | ORAL | Status: DC | PRN

## 2013-02-16 MED ORDER — OXYCODONE-ACETAMINOPHEN 5-325 MG PO TABS: 1.0000 | ORAL_TABLET | Freq: Four times a day (QID) | ORAL | Status: DC | PRN

## 2013-02-16 MED ORDER — POTASSIUM CHLORIDE 10 MEQ/100ML IV SOLN
10.0000 meq | INTRAVENOUS | Status: AC
  Administered 2013-02-16 (×2): 10 meq via INTRAVENOUS
  Filled 2013-02-16 (×2): qty 100

## 2013-02-16 MED ORDER — SODIUM CHLORIDE 0.9 % IV SOLN
INTRAVENOUS | Status: DC
  Administered 2013-02-16: 18:00:00 via INTRAVENOUS

## 2013-02-16 MED ORDER — SODIUM CHLORIDE 0.9 % IV SOLN: INTRAVENOUS | Status: DC

## 2013-02-16 MED ORDER — DIGOXIN 250 MCG PO TABS
0.2500 mg | ORAL_TABLET | Freq: Every day | ORAL | Status: DC
  Administered 2013-02-17 – 2013-02-19 (×3): 0.25 mg via ORAL
  Filled 2013-02-16 (×3): qty 1

## 2013-02-16 MED ORDER — COLCHICINE 0.6 MG PO TABS
0.6000 mg | ORAL_TABLET | Freq: Every day | ORAL | Status: DC
  Administered 2013-02-16 – 2013-02-19 (×4): 0.6 mg via ORAL
  Filled 2013-02-16 (×4): qty 1

## 2013-02-16 MED ORDER — BUDESONIDE-FORMOTEROL FUMARATE 160-4.5 MCG/ACT IN AERO
2.0000 | INHALATION_SPRAY | Freq: Two times a day (BID) | RESPIRATORY_TRACT | Status: DC
  Administered 2013-02-17 – 2013-02-19 (×5): 2 via RESPIRATORY_TRACT
  Filled 2013-02-16 (×2): qty 6

## 2013-02-16 MED ORDER — ALBUTEROL SULFATE (5 MG/ML) 0.5% IN NEBU: 2.5000 mg | INHALATION_SOLUTION | RESPIRATORY_TRACT | Status: DC | PRN

## 2013-02-16 MED ORDER — DEXTROSE-NACL 5-0.45 % IV SOLN: INTRAVENOUS | Status: DC

## 2013-02-16 MED ORDER — DEXTROSE-NACL 5-0.45 % IV SOLN
INTRAVENOUS | Status: DC
  Administered 2013-02-16: 22:00:00 via INTRAVENOUS

## 2013-02-16 MED ORDER — ONDANSETRON HCL 4 MG/2ML IJ SOLN: 4.0000 mg | Freq: Once | INTRAMUSCULAR | Status: DC

## 2013-02-16 MED ORDER — SODIUM CHLORIDE 0.9 % IV BOLUS (SEPSIS)
1000.0000 mL | Freq: Once | INTRAVENOUS | Status: AC
  Administered 2013-02-16: 1000 mL via INTRAVENOUS

## 2013-02-16 MED ORDER — ALBUTEROL SULFATE HFA 108 (90 BASE) MCG/ACT IN AERS
2.0000 | INHALATION_SPRAY | RESPIRATORY_TRACT | Status: DC | PRN
  Filled 2013-02-16: qty 6.7

## 2013-02-16 MED ORDER — SODIUM CHLORIDE 0.9 % IV SOLN
INTRAVENOUS | Status: DC
  Administered 2013-02-16: 5.4 [IU]/h via INTRAVENOUS
  Filled 2013-02-16: qty 1

## 2013-02-16 MED ORDER — HEPARIN SODIUM (PORCINE) 5000 UNIT/ML IJ SOLN
5000.0000 [IU] | Freq: Three times a day (TID) | INTRAMUSCULAR | Status: DC
Start: 2013-02-16 — End: 2013-02-17
  Administered 2013-02-16 – 2013-02-17 (×3): 5000 [IU] via SUBCUTANEOUS
  Filled 2013-02-16 (×6): qty 1

## 2013-02-16 MED ORDER — ACETAMINOPHEN 325 MG PO TABS: 650.0000 mg | ORAL_TABLET | Freq: Four times a day (QID) | ORAL | Status: DC | PRN

## 2013-02-16 MED ORDER — SIMVASTATIN 10 MG PO TABS
10.0000 mg | ORAL_TABLET | Freq: Every day | ORAL | Status: DC
  Administered 2013-02-16 – 2013-02-18 (×3): 10 mg via ORAL
  Filled 2013-02-16 (×4): qty 1

## 2013-02-16 MED ORDER — CARVEDILOL 25 MG PO TABS
25.0000 mg | ORAL_TABLET | Freq: Two times a day (BID) | ORAL | Status: DC
Start: 2013-02-16 — End: 2013-02-19
  Administered 2013-02-16 – 2013-02-19 (×6): 25 mg via ORAL
  Filled 2013-02-16 (×8): qty 1

## 2013-02-16 NOTE — ED Notes (Signed)
MD at bedside. 

## 2013-02-16 NOTE — H&P (Signed)
PATIENT DETAILS Name: Todd Sullivan Age: 57 y.o. Sex: male Date of Birth: [birth year] Admit Date: 02/16/2013 WUJ:[id number],YNWGN D, MD   CHIEF COMPLAINT:  Polyuria and polydipsia for the past 2-5 days. Vomiting for the past 2 days  HPI: Todd Sullivan is a 57 y.o. male with a Past Medical History of chronic systolic heart failure, diabetes, history of venous thromboembolism, hypertension who presents today with the above noted complaint. Per patient approximately 2 weeks ago he had diarrhea that resolved. He then started feeling somewhat weak. Over the past few days he has had worsening polyuria and polydipsia. Upon checking his sugars at home, his machine has only been able to read it as "high". He then started vomiting yesterday. He then presented to the emergency room today for further evaluation, where he was found to have glucose of more than 600, anion gap of 16 and was also found to be in renal failure. I was subsequently asked to admit this patient for further evaluation and treatment. Patient denies any headache, denies any chest pain or shortness of breath. Patient denies any abdominal pain, he currently does not have any diarrhea. There is no history of dysuria. Denies any fever. Does give a history of bilateral blurry vision  ALLERGIES:   Allergies  Allergen Reactions  . Banana     Sick     PAST MEDICAL HISTORY: Past Medical History  Diagnosis Date  . CHF (congestive heart failure)   . Diabetes mellitus   . Asthma   . Gout   . Shortness of breath   . Hypertension   . Stroke   . Headache   . Anxiety   . COPD (chronic obstructive pulmonary disease)   . Depression     PAST SURGICAL HISTORY: Past Surgical History  Procedure Laterality Date  . Eye surgery  57 years old    MEDICATIONS AT HOME: Prior to Admission medications   Medication Sig Start Date End Date Taking? Authorizing Provider  albuterol (PROVENTIL HFA;VENTOLIN HFA) 108 (90 BASE) MCG/ACT inhaler Inhale  2 puffs into the lungs every 4 (four) hours as needed. For shortness of breath. 09/19/11  Yes Osvaldo Shipper, MD  budesonide-formoterol [facility]) 160-4.5 MCG/ACT inhaler Inhale 2 puffs into the lungs 2 (two) times daily. 09/19/11  Yes Osvaldo Shipper, MD  carvedilol (COREG) 25 MG tablet Take 1 tablet (25 mg total) by mouth 2 (two) times daily with a meal. 01/04/13 01/04/14 Yes Dolores Patty, MD  cloNIDine (CATAPRES) 0.2 MG tablet Take 0.1 mg by mouth 2 (two) times daily.   Yes Historical Provider, MD  colchicine 0.6 MG tablet Take 1 tablet (0.6 mg total) by mouth daily. 05/28/12 05/28/13 Yes Arman Filter, NP  cyclobenzaprine (FLEXERIL) 10 MG tablet Take 10 mg by mouth 2 (two) times daily.    Yes Historical Provider, MD  digoxin (LANOXIN) 0.25 MG tablet Take 0.25 mg by mouth daily.   Yes Historical Provider, MD  furosemide (LASIX) 40 MG tablet Take 40 mg by mouth daily. 09/19/11  Yes Osvaldo Shipper, MD  lisinopril (PRINIVIL,ZESTRIL) 40 MG tablet Take 1 tablet (40 mg total) by mouth daily. 09/19/11  Yes Osvaldo Shipper, MD  metFORMIN (GLUCOPHAGE) 500 MG tablet Take 500 mg by mouth 2 (two) times daily with a meal.     Yes Historical Provider, MD  oxyCODONE-acetaminophen (PERCOCET) 10-325 MG per tablet Take 1 tablet by mouth every 4 (four) hours as needed for pain.    Yes Historical Provider, MD  pravastatin (PRAVACHOL) 20 MG tablet  Take 1 tablet (20 mg total) by mouth daily. 09/19/11  Yes Osvaldo Shipper, MD  Rivaroxaban (XARELTO) 20 MG TABS Take 20 mg by mouth daily.   Yes Historical Provider, MD  spironolactone (ALDACTONE) 25 MG tablet Take 25 mg by mouth daily.   Yes Historical Provider, MD    FAMILY HISTORY: Family History  Problem Relation Age of Onset  . Diabetes type II Mother   . Asthma Mother   . Diabetes type II Sister   . Asthma Sister   . Asthma Brother   . Asthma Son     SOCIAL HISTORY:  reports that he has been smoking Cigarettes.  He has a 20 pack-year smoking history. He  does not have any smokeless tobacco history on file. He reports that he does not drink alcohol or use illicit drugs.  REVIEW OF SYSTEMS:  Constitutional:   No  weight loss, night sweats,  Fevers, chills, fatigue.  HEENT:    No headaches, Difficulty swallowing,Tooth/dental problems,Sore throat,  No sneezing, itching, ear ache, nasal congestion, post nasal drip,   Cardio-vascular: No chest pain,  Orthopnea, PND, swelling in lower extremities, anasarca, dizziness, palpitations  GI:  No heartburn, indigestion, abdominal pain, nausea, vomiting, diarrhea, change in  bowel habits, loss of appetite  Resp: No shortness of breath with exertion or at rest.  No excess mucus, no productive cough, No non-productive cough,  No coughing up of blood.No change in color of mucus.No wheezing.No chest wall deformity  Skin:  no rash or lesions.  GU:  no dysuria, change in color of urine, no urgency or frequency.  No flank pain.  Musculoskeletal: No joint pain or swelling.  No decreased range of motion.  No back pain.  Psych: No change in mood or affect. No depression or anxiety.  No memory loss.   PHYSICAL EXAM: Blood pressure 118/66, pulse 68, temperature 98.5 F (36.9 C), temperature source Oral, resp. rate 20, SpO2 95.00%.  General appearance :Awake, alert, not in any distress. Speech Clear. Not toxic Looking HEENT: Atraumatic and Normocephalic, pupils equally reactive to light and accomodation Neck: supple, no JVD. No cervical lymphadenopathy.  Chest:Good air entry bilaterally, no added sounds  CVS: S1 S2 regular, no murmurs.  Abdomen: Bowel sounds present, Non tender and not distended with no gaurding, rigidity or rebound. Extremities: B/L Lower Ext shows no edema, both legs are warm to touch Neurology: Awake alert, and oriented X 3, CN II-XII intact, Non focal Skin:No Rash Wounds:N/A  LABS ON ADMISSION:   Recent Labs  02/16/13 1332  NA 125*  K 4.8  CL 88*  CO2 21  GLUCOSE 626*   BUN 37*  CREATININE 2.45*  CALCIUM 11.2*    Recent Labs  02/16/13 1332  AST 16  ALT 21  ALKPHOS 83  BILITOT 0.6  PROT 7.7  ALBUMIN 4.1   No results found for this basename: LIPASE, AMYLASE,  in the last 72 hours  Recent Labs  02/16/13 1332  WBC 10.5  HGB 15.0  HCT 42.3  MCV 78.8  PLT 337   No results found for this basename: CKTOTAL, CKMB, CKMBINDEX, TROPONINI,  in the last 72 hours No results found for this basename: DDIMER,  in the last 72 hours No components found with this basename: POCBNP,    RADIOLOGIC STUDIES ON ADMISSION: No results found.   EKG: Independently reviewed. NSR  ASSESSMENT AND PLAN: Present on Admission:  . DKA - Admit the step down  - Place on IV fluids and IV  insulin per DKA protocol  - Check A1c  - Is only on metformin as an outpatient, likely would need subcutaneous insulin on discharge   . ARF (acute renal failure) - Likely prerenal given severe hyperglycemia  - Will hydrate and recheck electrolytes in a.m. - Must be cautious with fluids as he does have a history of congestive heart failure-I spoke with his primary cardiologist Dr. Jacinto Halim, his EF is now normal previously it was 20-25%.  . Dehydration - Appears clinically very dry, will hydrate cautiously.   . Chronic systolic heart failure - Hold ACE inhibitor and Lasix for now given ARF  - Clinically compensated at this point in time, hydrate him with IV fluids as is dry and has severe hyperglycemia and acute renal failure. We need to watch for signs of fluid overload. - Please resume Lasix when able.   . DM - Apparently only on metformin as an outpatient  - Previously was on insulin, however that was discontinued.  - Given DKA and severity of symptoms-suspect patient will likely need insulin on discharge.   . EMPHYSEMA - Clinically stable, lungs without wheezing.  - As needed albuterol inhaler for now.   Marland Kitchen VTE (venous thromboembolism) - She has a history of multiple  venous thromboembolism, is on chronic Xarelto as an outpatient. Given acute renal failure, would hold Xarelto for now, and placed on prophylactic subcutaneous heparin, please resume Xarelto when able,depending on her renal function.   . Obesities, morbid - Counseled regarding importance of weight loss.   Marland Kitchen HYPERTENSION - Holding lisinopril given ARF  - Continue with Coreg and clonidine with holding parameters.   Marland Kitchen HYPERLIPIDEMIA - Continue with statin  Further plan will depend as patient's clinical course evolves and further radiologic and laboratory data become available. Patient will be monitored closely.   DVT Prophylaxis: Prophylactic Heparin for now-please resume Xarelto once Creatinine clearance is better  Code Status: Full Code  Total time spent for admission equals 45 minutes.  [facility] Pager [phone]  If 7PM-7AM, please contact night-coverage [email or web address] Password [facility] 02/16/2013, 4:39 PM

## 2013-02-16 NOTE — ED Notes (Signed)
MD at bedside; admitting

## 2013-02-16 NOTE — ED Notes (Signed)
Pt reports he checked his CBG last night and the meter read high, states this morning he woke up and checked it again and it was in the 500's. Pt denies any pain at this time. States he just does not fell himself at this time. No distress noted.

## 2013-02-16 NOTE — ED Provider Notes (Signed)
History     CSN: [id number]  Arrival date & time 02/16/13  1237   First MD Initiated Contact with Patient 02/16/13 1310      Chief Complaint  Patient presents with  . Hyperglycemia    (Consider location/radiation/quality/duration/timing/severity/associated sxs/prior treatment) HPI Comments: Patient reports he has history of congestive heart failure, type 2 diabetes, currently only on metformin, used to be on insulin. He reports he did have a few days of diarrhea, specifically about 4 or 5 loose bowel movements per day for a few days, now resolved. He reports the last 3 or 4 days, increase in thirst, polyuria and generalized malaise. However since last night and into this morning he has had several episodes of vomiting. He denies any known prior history of being in DKA. He reports no chest pain, shortness of breath no obvious weight gain or loss. He endorses feeling extremely thirsty. He denies any focal weakness or numbness. He reports he has been compliant with all of his usual medications.  The history is provided by the patient and a relative.    Past Medical History  Diagnosis Date  . CHF (congestive heart failure)   . Diabetes mellitus   . Asthma   . Gout   . Shortness of breath   . Hypertension   . Stroke   . Headache   . Anxiety   . COPD (chronic obstructive pulmonary disease)   . Depression     Past Surgical History  Procedure Laterality Date  . Eye surgery  57 years old    Family History  Problem Relation Age of Onset  . Diabetes type II Mother   . Asthma Mother   . Diabetes type II Sister   . Asthma Sister   . Asthma Brother   . Asthma Son     History  Substance Use Topics  . Smoking status: Current Every Day Smoker -- 0.50 packs/day for 40 years    Types: Cigarettes  . Smokeless tobacco: Not on file  . Alcohol Use: No      Review of Systems  Constitutional: Positive for fatigue. Negative for fever and chills.  Respiratory: Negative for cough and  shortness of breath.   Cardiovascular: Negative for chest pain.  Gastrointestinal: Positive for nausea, vomiting and diarrhea.  Endocrine: Positive for polydipsia and polyuria.  Neurological: Negative for syncope and numbness.  All other systems reviewed and are negative.    Allergies  Banana  Home Medications   Current Outpatient Rx  Name  Route  Sig  Dispense  Refill  . albuterol (PROVENTIL HFA;VENTOLIN HFA) 108 (90 BASE) MCG/ACT inhaler   Inhalation   Inhale 2 puffs into the lungs every 4 (four) hours as needed. For shortness of breath.   1 Inhaler   3   . budesonide-formoterol (SYMBICORT) 160-4.5 MCG/ACT inhaler   Inhalation   Inhale 2 puffs into the lungs 2 (two) times daily.   1 Inhaler   3   . carvedilol (COREG) 25 MG tablet   Oral   Take 1 tablet (25 mg total) by mouth 2 (two) times daily with a meal.   60 tablet   4   . cloNIDine (CATAPRES) 0.2 MG tablet   Oral   Take 0.1 mg by mouth 2 (two) times daily.         . colchicine 0.6 MG tablet   Oral   Take 1 tablet (0.6 mg total) by mouth daily.   30 tablet   0   .  cyclobenzaprine (FLEXERIL) 10 MG tablet   Oral   Take 10 mg by mouth 2 (two) times daily.          . digoxin (LANOXIN) 0.25 MG tablet   Oral   Take 0.25 mg by mouth daily.         . furosemide (LASIX) 40 MG tablet   Oral   Take 40 mg by mouth daily.         Marland Kitchen lisinopril (PRINIVIL,ZESTRIL) 40 MG tablet   Oral   Take 1 tablet (40 mg total) by mouth daily.   30 tablet   2   . metFORMIN (GLUCOPHAGE) 500 MG tablet   Oral   Take 500 mg by mouth 2 (two) times daily with a meal.           . oxyCODONE-acetaminophen (PERCOCET) 10-325 MG per tablet   Oral   Take 1 tablet by mouth every 4 (four) hours as needed for pain.          . pravastatin (PRAVACHOL) 20 MG tablet   Oral   Take 1 tablet (20 mg total) by mouth daily.   30 tablet   2   . Rivaroxaban (XARELTO) 20 MG TABS   Oral   Take 20 mg by mouth daily.         Marland Kitchen  spironolactone (ALDACTONE) 25 MG tablet   Oral   Take 25 mg by mouth daily.           BP 117/66  Pulse 68  Temp(Src) 98.5 F (36.9 C) (Oral)  Resp 18  SpO2 96%  Physical Exam  Nursing note and vitals reviewed. Constitutional: He is oriented to person, place, and time. He appears well-developed and well-nourished. He appears listless. He appears ill.  HENT:  Head: Normocephalic and atraumatic.  Mouth/Throat: Uvula is midline. Mucous membranes are dry.  Eyes: EOM are normal.  Cardiovascular: Normal rate and regular rhythm.   Abdominal: Soft. He exhibits no distension. There is no tenderness.  Musculoskeletal: He exhibits no edema.  Neurological: He is oriented to person, place, and time. He has normal strength. He appears listless. He exhibits normal muscle tone. GCS eye subscore is 4. GCS verbal subscore is 5. GCS motor subscore is 6.  Skin: Skin is warm.  Psychiatric: His speech is not slurred. He is slowed.    ED Course  Procedures (including critical care time)   CRITICAL CARE Performed by: Lear Ng. Total critical care time: 30 min Critical care time was exclusive of separately billable procedures and treating other patients. Critical care was necessary to treat or prevent imminent or life-threatening deterioration. Critical care was time spent personally by me on the following activities: development of treatment plan with patient and/or surrogate as well as nursing, discussions with consultants, evaluation of patient's response to treatment, examination of patient, obtaining history from patient or surrogate, ordering and performing treatments and interventions, ordering and review of laboratory studies, ordering and review of radiographic studies, pulse oximetry and re-evaluation of patient's condition.  Labs Reviewed  GLUCOSE, CAPILLARY - Abnormal; Notable for the following:    Glucose-Capillary >600 (*)    All other components within normal limits   COMPREHENSIVE METABOLIC PANEL - Abnormal; Notable for the following:    Sodium 125 (*)    Chloride 88 (*)    Glucose, Bld 626 (*)    BUN 37 (*)    Creatinine, Ser 2.45 (*)    Calcium 11.2 (*)    GFR calc non  Af Amer 28 (*)    GFR calc Af Amer 32 (*)    All other components within normal limits  GLUCOSE, CAPILLARY - Abnormal; Notable for the following:    Glucose-Capillary >600 (*)    All other components within normal limits  CBC  URINALYSIS, ROUTINE W REFLEX MICROSCOPIC   No results found.   1. Diabetic ketoacidosis   2. Dehydration   3. Renal insufficiency      Room air saturation is 98% and I interpret this to be normal  3:19 PM Bicarb remains low normal at 21, however anion gap of 16 is suggestive of early DKA.  Renal failure with new Cr of 2.45 with BUN of 37 present.  K+ is normal.  Will continue therapy of IVF's, IV insulin and will contact hospitalist for admission.      MDM   Patient with extreme hyperglycemia, normal vital signs, however clinically I suspect the patient is an diabetic ketoacidosis. Patient takes extremely dehydrated appearing. Mentation is slightly slow although he is alert and oriented in conversant. Mood is good. Possibly the patient's bouts of diarrhea a week ago and may have been the trigger. Otherwise the patient may just simply be resistant to oral hypoglycemic agents only requires insulin at this time. For now, will bolus IV fluids carefully given history of CHF, will need IV insulin for correction. Labs and urinalysis continued to be pending. Likely will need admission to step down if indeed he is in diabetic ketoacidosis.        Gavin Pound. Shiana Rappleye, MD 02/16/13 1521

## 2013-02-17 DIAGNOSIS — E860 Dehydration: Secondary | ICD-10-CM

## 2013-02-17 DIAGNOSIS — N179 Acute kidney failure, unspecified: Secondary | ICD-10-CM

## 2013-02-17 DIAGNOSIS — E1101 Type 2 diabetes mellitus with hyperosmolarity with coma: Principal | ICD-10-CM

## 2013-02-17 DIAGNOSIS — I5022 Chronic systolic (congestive) heart failure: Secondary | ICD-10-CM

## 2013-02-17 LAB — GLUCOSE, CAPILLARY
Glucose-Capillary: 167 mg/dL — ABNORMAL HIGH (ref 70–99)
Glucose-Capillary: 174 mg/dL — ABNORMAL HIGH (ref 70–99)
Glucose-Capillary: 248 mg/dL — ABNORMAL HIGH (ref 70–99)
Glucose-Capillary: 387 mg/dL — ABNORMAL HIGH (ref 70–99)
Glucose-Capillary: 449 mg/dL — ABNORMAL HIGH (ref 70–99)

## 2013-02-17 MED ORDER — RIVAROXABAN 20 MG PO TABS
20.0000 mg | ORAL_TABLET | Freq: Every day | ORAL | Status: DC
  Administered 2013-02-17: 20 mg via ORAL
  Filled 2013-02-17: qty 1

## 2013-02-17 MED ORDER — RIVAROXABAN 20 MG PO TABS
20.0000 mg | ORAL_TABLET | Freq: Every day | ORAL | Status: DC
  Administered 2013-02-18: 20 mg via ORAL
  Filled 2013-02-17 (×2): qty 1

## 2013-02-17 MED ORDER — INSULIN ASPART 100 UNIT/ML ~~LOC~~ SOLN: 0.0000 [IU] | Freq: Every day | SUBCUTANEOUS | Status: DC

## 2013-02-17 MED ORDER — INSULIN GLARGINE 100 UNIT/ML ~~LOC~~ SOLN
15.0000 [IU] | Freq: Every day | SUBCUTANEOUS | Status: DC
  Administered 2013-02-18: 15 [IU] via SUBCUTANEOUS
  Filled 2013-02-17: qty 0.15

## 2013-02-17 MED ORDER — INSULIN ASPART 100 UNIT/ML ~~LOC~~ SOLN
0.0000 [IU] | Freq: Every day | SUBCUTANEOUS | Status: DC
  Administered 2013-02-17: 5 [IU] via SUBCUTANEOUS

## 2013-02-17 MED ORDER — INSULIN ASPART 100 UNIT/ML ~~LOC~~ SOLN
0.0000 [IU] | Freq: Three times a day (TID) | SUBCUTANEOUS | Status: DC
  Administered 2013-02-17: 20 [IU] via SUBCUTANEOUS
  Administered 2013-02-18: 7 [IU] via SUBCUTANEOUS
  Administered 2013-02-18: 20 [IU] via SUBCUTANEOUS
  Administered 2013-02-18: 15 [IU] via SUBCUTANEOUS
  Administered 2013-02-19: 11 [IU] via SUBCUTANEOUS
  Administered 2013-02-19: 20 [IU] via SUBCUTANEOUS

## 2013-02-17 MED ORDER — INSULIN ASPART 100 UNIT/ML ~~LOC~~ SOLN
0.0000 [IU] | Freq: Three times a day (TID) | SUBCUTANEOUS | Status: DC
  Administered 2013-02-17: 5 [IU] via SUBCUTANEOUS
  Administered 2013-02-17: 11 [IU] via SUBCUTANEOUS

## 2013-02-17 MED ORDER — INSULIN GLARGINE 100 UNIT/ML ~~LOC~~ SOLN
10.0000 [IU] | Freq: Every day | SUBCUTANEOUS | Status: DC
  Administered 2013-02-17: 10 [IU] via SUBCUTANEOUS
  Filled 2013-02-17: qty 0.1

## 2013-02-17 MED ORDER — SODIUM CHLORIDE 0.9 % IV SOLN
INTRAVENOUS | Status: DC
  Administered 2013-02-17: 05:00:00 via INTRAVENOUS

## 2013-02-17 NOTE — Progress Notes (Signed)
Dr. Butler Denmark notified that pt's CBG is 449--plan of care is to change SSI to Resistant; will give 20 units for [facility] dinner coverage. Renette Butters, Viona Gilmore

## 2013-02-17 NOTE — Progress Notes (Signed)
[facility] Progress Note [place] TEAM 1 - Stepdown/ICU TEAM   Ahamed Hofland ZOX:[id number] DOB: July 30, 1956 DOA: 02/16/2013 PCP: Karie Chimera, MD  Brief narrative: Todd Sullivan is a 57 y.o. male with a Past Medical History of chronic systolic heart failure, diabetes, history of venous thromboembolism, hypertension who presents today with the above noted complaint. Per patient approximately 2 weeks ago he had diarrhea that resolved. He then started feeling somewhat weak. Over the past few days he has had worsening polyuria and polydipsia. Upon checking his sugars at home, his machine has only been able to read it as "high". He then started vomiting yesterday. He then presented to the emergency room today for further evaluation, where he was found to have glucose of more than 600, anion gap of 16 and was also found to be in renal failure. I was subsequently asked to admit this patient for further evaluation and treatment.   Assessment/Plan: Principal Problem:   Hyperosmolar non-ketotic state in patient with type 2 diabetes mellitus - has been switched to subcu insulin and will need to be discharged with insulin - has was on insulin 4 yrs ago but does not remember which type and how much - consult diabetes coordinator for further teaching  Active Problems:   VTE (venous thromboembolism) - cont Xarelto today    HYPERLIPIDEMIA - cont statin    HYPERTENSION - holding ACE - cont  Coreg and Clonidine    EMPHYSEMA - stable    Obesity, morbid    Chronic systolic heart failure - holding diuretics - d/c IVF today    ARF (acute renal failure)/Dehydration - improved- baseline Cr is 1.0 - holding diuretics     Code Status: full code Family Communication: none Disposition Plan: transfer to med surg  Consultants: none  Procedures: none  Antibiotics: none  DVT prophylaxis: Xarelto- d/c Heparin  HPI/Subjective: Quite sleepy- no significant complaints-  understands that he is to go back on insulin now   Objective: Blood pressure 106/57, pulse 68, temperature 98.4 F (36.9 C), temperature source Oral, resp. rate 21, height 6' (1.829 m), weight 128.8 kg (283 lb 15.2 oz), SpO2 95.00%.  Intake/Output Summary (Last 24 hours) at 02/17/13 1642 Last data filed at 02/17/13 1300  Gross per 24 hour  Intake 1981.2 ml  Output      0 ml  Net 1981.2 ml     Exam: General: morbidly obese, No acute respiratory distress Lungs: Clear to auscultation bilaterally without wheezes or crackles Cardiovascular: Regular rate and rhythm without murmur gallop or rub normal S1 and S2 Abdomen: Nontender, nondistended, soft, bowel sounds positive, no rebound, no ascites, no appreciable mass Extremities: No significant cyanosis, clubbing, or edema bilateral lower extremities  Data Reviewed: Basic Metabolic Panel:  Recent Labs Lab 02/16/13 1332 02/16/13 1737 02/16/13 1931 02/16/13 2202  NA 125* 129* 133* 135  K 4.8 4.2 3.9 3.7  CL 88* 94* 98 100  CO2 21 22 23 24   GLUCOSE 626* 418* 326* 160*  BUN 37* 35* 35* 33*  CREATININE 2.45* 2.08* 1.97* 1.92*  CALCIUM 11.2* 10.3 9.8 9.8   Liver Function Tests:  Recent Labs Lab 02/16/13 1332  AST 16  ALT 21  ALKPHOS 83  BILITOT 0.6  PROT 7.7  ALBUMIN 4.1   No results found for this basename: LIPASE, AMYLASE,  in the last 168 hours No results found for this basename: AMMONIA,  in the last 168 hours CBC:  Recent Labs Lab 02/16/13 1332 02/16/13 1737  WBC 10.5  12.3*  HGB 15.0 14.7  HCT 42.3 42.3  MCV 78.8 78.9  PLT 337 321   Cardiac Enzymes: No results found for this basename: CKTOTAL, CKMB, CKMBINDEX, TROPONINI,  in the last 168 hours BNP (last 3 results) No results found for this basename: PROBNP,  in the last 8760 hours CBG:  Recent Labs Lab 02/17/13 0143 02/17/13 0242 02/17/13 0356 02/17/13 0825 02/17/13 1119  GLUCAP 159* 211* 174* 248* 333*    Recent Results (from the past 240  hour(s))  MRSA PCR SCREENING     Status: None   Collection Time    02/16/13  4:49 PM      Result Value Range Status   MRSA by PCR NEGATIVE  NEGATIVE Final   Comment:            The GeneXpert MRSA Assay (FDA     approved for NASAL specimens     only), is one component of a     comprehensive MRSA colonization     surveillance program. It is not     intended to diagnose MRSA     infection nor to guide or     monitor treatment for     MRSA infections.     Studies:  Recent x-ray studies have been reviewed in detail by the Attending Physician  Scheduled Meds:  Scheduled Meds: . budesonide-formoterol  2 puff Inhalation BID  . carvedilol  25 mg Oral BID WC  . cloNIDine  0.1 mg Oral BID  . colchicine  0.6 mg Oral Daily  . digoxin  0.25 mg Oral Daily  . insulin aspart  0-15 Units Subcutaneous TID WC  . insulin aspart  0-5 Units Subcutaneous QHS  . insulin glargine  10 Units Subcutaneous Daily  . [START ON 02/18/2013] Rivaroxaban  20 mg Oral Q supper  . simvastatin  10 mg Oral q1800   Continuous Infusions:   Time spent on care of this patient: 35 min   Calvert Cantor, MD [phone]  [facility] Office  [phone] Pager - Text Page per Amion as per below:  On-Call/Text Page:      [email or web address]      password [email or web address]  If 7PM-7AM, please contact night-coverage [email or web address] Password [email or web address] 02/17/2013, 4:42 PM   LOS: 1 day

## 2013-02-17 NOTE — Plan of Care (Signed)
Problem: Phase I Progression Outcomes Goal: CBGs steadily decreasing on IV insulin drip Outcome: Progressing cbgs are decreasing Goal: Acidosis resolving Outcome: Progressing Anion gap resolving Goal: NPO or per MD order Outcome: Progressing Pt npo Goal: Pain controlled with appropriate interventions Outcome: Progressing Pt denies pain

## 2013-02-18 DIAGNOSIS — E1110 Type 2 diabetes mellitus with ketoacidosis without coma: Secondary | ICD-10-CM

## 2013-02-18 LAB — CBC
HCT: 37.8 % — ABNORMAL LOW (ref 39.0–52.0)
Hemoglobin: 12.8 g/dL — ABNORMAL LOW (ref 13.0–17.0)
RDW: 13.1 % (ref 11.5–15.5)
WBC: 8.4 10*3/uL (ref 4.0–10.5)

## 2013-02-18 LAB — GLUCOSE, CAPILLARY: Glucose-Capillary: 428 mg/dL — ABNORMAL HIGH (ref 70–99)

## 2013-02-18 LAB — BASIC METABOLIC PANEL
Chloride: 100 mEq/L (ref 96–112)
Creatinine, Ser: 1.61 mg/dL — ABNORMAL HIGH (ref 0.50–1.35)
GFR calc Af Amer: 54 mL/min — ABNORMAL LOW (ref >90)
Potassium: 4.1 mEq/L (ref 3.5–5.1)

## 2013-02-18 MED ORDER — INSULIN GLARGINE 100 UNIT/ML ~~LOC~~ SOLN
5.0000 [IU] | Freq: Once | SUBCUTANEOUS | Status: AC
  Administered 2013-02-18: 5 [IU] via SUBCUTANEOUS
  Filled 2013-02-18: qty 0.05

## 2013-02-18 MED ORDER — INSULIN ASPART 100 UNIT/ML ~~LOC~~ SOLN
10.0000 [IU] | Freq: Once | SUBCUTANEOUS | Status: AC
  Administered 2013-02-18: 10 [IU] via SUBCUTANEOUS

## 2013-02-18 MED ORDER — INSULIN PEN STARTER KIT
1.0000 | Freq: Once | Status: DC
  Filled 2013-02-18: qty 1

## 2013-02-18 MED ORDER — INSULIN GLARGINE 100 UNIT/ML ~~LOC~~ SOLN
20.0000 [IU] | Freq: Every day | SUBCUTANEOUS | Status: DC
  Filled 2013-02-18: qty 0.2

## 2013-02-18 NOTE — Progress Notes (Signed)
[facility] Progress Note [place] TEAM 1 - Stepdown/ICU TEAM   Todd Sullivan BJY:[id number] DOB: 1956/01/14 DOA: 02/16/2013 PCP: Karie Chimera, MD  Brief narrative: 57 y.o. male with a medical history of chronic systolic heart failure, diabetes, history of venous thromboembolism, and hypertension who presented with polyuria and polydipsia. Per patient approximately 2 weeks prior to admission he had diarrhea that resolved spontaneously. He then started feeling somewhat weak. Over a few days prior to admission he had worsening polyuria and polydipsia. Upon checking his sugars at home, his machine was only able to read "high". He then started vomiting. He presented to the emergency room for further evaluation, where he was found to have glucose of >600, anion gap of 16 and was also found to be in renal failure.   The patient was admitted to the acute units and his hyperosmolar state rapidly resolved with the use of IV insulin.  Unfortunately however his CBGs have proven to be difficult to manage after coming off the IV insulin and his hospital stay has therefore been extended while his insulin dose is titrated.   Assessment/Plan:  Hyperosmolar non-ketotic state in patient with type 2 diabetes mellitus - has been switched to SQ insulin but CBG still quite high and at times > 400 so have increased to 20 units daily - was on insulin 4 yrs ago but does not remember which type and how much - appreciate diabetes coordinator assistance - At time of discharge, patient will need prescription for insulin pens, insulin pen needles, glucometer, testing strips, and lancets  Hx of multiple chronic VTE (venous thromboembolism) - cont Xarelto   Acute kidney failure/injury -peak Scr 2.45 at admit- now down to 1.61 but BUN still up at 26 -baseline Scr 0.96 in 2012 and 1.22 in 2013 -due to [facility] from hyperglycemia, with 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 Jacinto Halim is primary OP cardiologist - holding diuretics - IVF dc'd 5/11   Code Status: FULL Family Communication: Spoke with patient at length at bedside Disposition Plan: transfer to med surg  Consultants: none  Procedures: none  Antibiotics: none  DVT prophylaxis: Xarelto  HPI/Subjective: Awake alert and interactive.  Denies chest pain fevers chills nausea or vomiting.  He is frustrated that CBGs are not already under control.  Understands need to control CBG prior to discharge home.  Objective: Blood pressure 128/63, pulse 72, temperature 98 F (36.7 C), temperature source Oral, resp. rate 19, height 6' (1.829 m), weight 128.8 kg (283 lb 15.2 oz), SpO2 98.00%.  Intake/Output Summary (Last 24 hours) at 02/18/13 1250 Last data filed at 02/18/13 0865  Gross per 24 hour  Intake    960 ml  Output      0 ml  Net    960 ml    Exam: General: No acute respiratory distress Lungs: Clear to auscultation bilaterally without wheezes or crackles Cardiovascular: Regular rate and rhythm without murmur gallop or rub normal S1 and S2 Abdomen: Obese, ,ontender, nondistended, soft, bowel sounds positive, no rebound, no ascites, no appreciable mass  Extremities: No significant cyanosis, clubbing, or edema bilateral lower extremities  Data Reviewed: Basic Metabolic Panel:  Recent Labs Lab 02/16/13 1332 02/16/13 1737 02/16/13 1931 02/16/13 2202 02/18/13 0400  NA 125* 129* 133* 135 132*  K 4.8 4.2 3.9 3.7 4.1  CL 88* 94* 98 100 100  CO2 21 22 23 24 21   GLUCOSE 626* 418* 326* 160* 341*  BUN 37*  35* 35* 33* 26*  CREATININE 2.45* 2.08* 1.97* 1.92* 1.61*  CALCIUM 11.2* 10.3 9.8 9.8 8.9   Liver Function Tests:  Recent Labs Lab 02/16/13 1332  AST 16  ALT 21  ALKPHOS 83  BILITOT 0.6  PROT 7.7  ALBUMIN 4.1   CBC:  Recent Labs Lab 02/16/13 1332 02/16/13 1737 02/18/13 0400  WBC 10.5 12.3* 8.4  HGB 15.0 14.7 12.8*  HCT 42.3 42.3 37.8*  MCV 78.8 78.9 79.9  PLT 337 321 265   CBG:  Recent Labs Lab 02/17/13 1119 02/17/13 1730 02/17/13 2211 02/18/13 0753 02/18/13 1214  GLUCAP 333* 449* 387* 328* 355*    Recent Results (from the past 240 hour(s))  MRSA PCR SCREENING     Status: None   Collection Time    02/16/13  4:49 PM      Result Value Range Status   MRSA by PCR NEGATIVE  NEGATIVE Final   Comment:            The GeneXpert MRSA Assay (FDA     approved for NASAL specimens     only), is one component of a     comprehensive MRSA colonization     surveillance program. It is not     intended to diagnose MRSA     infection nor to guide or     monitor treatment for     MRSA infections.     Studies:  Recent x-ray studies have been reviewed in detail by the Attending Physician  Scheduled Meds:  Scheduled Meds: . budesonide-formoterol  2 puff Inhalation BID  . carvedilol  25 mg Oral BID WC  . cloNIDine  0.1 mg Oral BID  . colchicine  0.6 mg Oral Daily  . digoxin  0.25 mg Oral Daily  . insulin aspart  0-20 Units Subcutaneous TID WC  . insulin aspart  0-5 Units Subcutaneous QHS  . [START ON 02/19/2013] insulin glargine  20 Units Subcutaneous Daily  . Insulin Pen Starter Kit  1 kit Other Once  . Rivaroxaban  20 mg Oral Q supper  . simvastatin  10 mg Oral q1800   Continuous Infusions:   Time spent on care of this patient: 35 mins   ELLIS,ALLISON L.,ANP [phone]  [facility] Office  [phone] Pager - Text Page per Loretha Stapler as per below:  On-Call/Text Page:      [email or web address]      password [email or web address]  If 7PM-7AM, please contact  night-coverage [email or web address] Password [email or web address] 02/18/2013, 12:50 PM   LOS: 2 days    I have personally examined this patient and reviewed the entire database. I have reviewed the above note, made any necessary editorial changes, and agree with its content.  Lonia Blood, MD [facility]

## 2013-02-18 NOTE — Progress Notes (Signed)
Utilization review completed.  

## 2013-02-18 NOTE — Progress Notes (Signed)
Patient has been transferred to room 5531 via wheelchair. Phone reort was given to Jim,rn. Patient has been made of the new room number

## 2013-02-18 NOTE — Progress Notes (Signed)
Spoke with pt about diabetes and insulin. Consulted regarding new start to insulin.  Patient reports that he was on insulin in the past for a short time.  In the past he used vial/syringe but has requested to use insulin pens.  Instructed patient on Lantus and Novolog insulins and taught how to use an insulin pen appropriately.  Patient is able to teach back and demonstrate insulin pen use.  Discussed A1C results (10.7%) with him and he reports that his last A1C several months ago was in the 6% range.  Discussed target A1C and importance of checking CBGs and maintaining good CBG control to prevent long-term and short-term complications.  According to the patient, his current meter and testing supplies are outdated and he requested a prescription for a new meter at time of discharge. Reviewed signs and symptoms of  Hypoglycemia along with proper treatment.  RNs to provide ongoing basic DM education at bedside with this patient and please allow patient to self administer insulin injections. Have ordered insulin pen starter kit.  Patient verbalized understanding of information discussed and reports that he has no other questions at this time.  Will continue to follow as an inpatient.   MD - At time of discharge, patient will need prescription for insulin pens, insulin pen needles, glucometer, testing strips, and lancets.  Thanks, Orlando Penner, RN, BSN, CCRN Diabetes Coordinator Inpatient Diabetes Program [phone]

## 2013-02-18 NOTE — Progress Notes (Signed)
Inpatient Diabetes Program Recommendations  AACE/ADA: New Consensus Statement on Inpatient Glycemic Control (2013)  Target Ranges:  Prepandial:   less than 140 mg/dL      Peak postprandial:   less than 180 mg/dL (1-2 hours)      Critically ill patients:  140 - 180 mg/dL    Results for LAW, CORSINO (MRN [id number]) as of 02/18/2013 10:04  Ref. Range 02/17/2013 08:25 02/17/2013 11:19 02/17/2013 17:30 02/17/2013 22:11 02/18/2013 07:53  Glucose-Capillary Latest Range: 70-99 mg/dL 409 (H) 811 (H) 914 (H) 387 (H) 328 (H)   Inpatient Diabetes Program Recommendations Insulin - Basal: Please consider increasing Lantus to 25 units daily.  Note: Blood glucose over the past 24 hours has ranged from 248-449 mg/dl and fasting blood glucose this morning was 328 mg/dl.  Patient received Lantus 10 units @ 3:12 am on 02/17/13 and Lantus order was increased to 15 units daily to start today.  However, with the current trend in blood glucose it is anticipated that patient will require more basal insulin.  Please consider increasing Lantus to 25 units daily and adjust accordingly to improve glycemic control.  Will continue to follow.  Thanks, Orlando Penner, RN, BSN, CCRN Diabetes Coordinator Inpatient Diabetes Program [phone]

## 2013-02-18 NOTE — Progress Notes (Signed)
Writer observed patient was able to draw up and administer 20 units of novolog  Without any difficulty. This was by requested diabetic teaching nurse  For staff to observe patient administering his own insulin.

## 2013-02-19 LAB — BASIC METABOLIC PANEL
CO2: 24 mEq/L (ref 19–32)
Calcium: 9.1 mg/dL (ref 8.4–10.5)
GFR calc Af Amer: 56 mL/min — ABNORMAL LOW (ref >90)
GFR calc non Af Amer: 48 mL/min — ABNORMAL LOW (ref >90)
Sodium: 136 mEq/L (ref 135–145)

## 2013-02-19 LAB — GLUCOSE, CAPILLARY
Glucose-Capillary: 380 mg/dL — ABNORMAL HIGH (ref 70–99)
Glucose-Capillary: 400 mg/dL — ABNORMAL HIGH (ref 70–99)
Glucose-Capillary: 275 mg/dL — ABNORMAL HIGH (ref 70–99)

## 2013-02-19 LAB — DIGOXIN LEVEL: Digoxin Level: 0.9 ng/mL (ref 0.8–2.0)

## 2013-02-19 MED ORDER — INSULIN ASPART 100 UNIT/ML ~~LOC~~ SOLN
6.0000 [IU] | Freq: Three times a day (TID) | SUBCUTANEOUS | Status: DC
  Administered 2013-02-19 (×2): 6 [IU] via SUBCUTANEOUS

## 2013-02-19 MED ORDER — INSULIN GLARGINE 100 UNIT/ML SOLOSTAR PEN: 30.0000 [IU] | PEN_INJECTOR | Freq: Every day | SUBCUTANEOUS | Status: DC

## 2013-02-19 MED ORDER — INSULIN GLARGINE 100 UNIT/ML ~~LOC~~ SOLN
25.0000 [IU] | Freq: Every day | SUBCUTANEOUS | Status: DC
  Filled 2013-02-19: qty 0.25

## 2013-02-19 MED ORDER — LISINOPRIL 40 MG PO TABS: 40.0000 mg | ORAL_TABLET | Freq: Every day | ORAL | Status: DC

## 2013-02-19 MED ORDER — INSULIN PEN STARTER KIT: 1.0000 | Freq: Once | Status: DC

## 2013-02-19 MED ORDER — INSULIN GLARGINE 100 UNIT/ML ~~LOC~~ SOLN
30.0000 [IU] | Freq: Every day | SUBCUTANEOUS | Status: DC
  Administered 2013-02-19: 30 [IU] via SUBCUTANEOUS
  Filled 2013-02-19: qty 0.3

## 2013-02-19 MED ORDER — INSULIN ASPART 100 UNIT/ML ~~LOC~~ SOLN: 0.0000 [IU] | Freq: Three times a day (TID) | SUBCUTANEOUS | Status: DC

## 2013-02-19 MED ORDER — INSULIN ASPART 100 UNIT/ML FLEXPEN: 6.0000 [IU] | PEN_INJECTOR | Freq: Three times a day (TID) | SUBCUTANEOUS | Status: DC

## 2013-02-19 MED ORDER — METRONIDAZOLE 500 MG PO TABS
2000.0000 mg | ORAL_TABLET | Freq: Once | ORAL | Status: AC
  Administered 2013-02-19: 2000 mg via ORAL
  Filled 2013-02-19: qty 4

## 2013-02-19 NOTE — Progress Notes (Signed)
D/c instructions reviewed with pt. Copy of instructions and scripts given to pt. Pt d/c'd via wheelchair with belongings with family escorted by hospital volunteer.

## 2013-02-19 NOTE — Discharge Summary (Signed)
PATIENT DETAILS Name: Todd Sullivan Age: 57 y.o. Sex: male Date of Birth: 05-Dec-1955 MRN: [id number]. Admit Date: 02/16/2013 Admitting Physician: Maretta Bees, MD WGN:FAOZH,YQMVH D, MD  Recommendations for Outpatient Follow-up:  1. Patient presented with anion gap of 16, received insulin drip, upon discharge began Lantus 30 units qhs and Novolog 6 units TID before meals . Please optimize insulin regimen on follow up 2. CHF - evaluate fluid status, diuretics held during admission. Please restart Lisinopril once Creatinine normalizes. Will need to check a BMET at next visit.  3. Acute Kidney Injury - likely due to dehydration from hyperglycemia, SCr trending downward 1.5 at discharge, BUN normalized at 21. Have held Lisinopril till renal function normalizes-please resume 4. Trichomonas - urine was positive, gave 2g Flagyl, educated on importance of informing partners   PRIMARY DISCHARGE DIAGNOSIS:  Principal Problem:   Hyperosmolar non-ketotic state in patient with type 2 diabetes mellitus Active Problems:   DM   HYPERLIPIDEMIA   HYPERTENSION   EMPHYSEMA   Obesities, morbid   VTE (venous thromboembolism)   Chronic systolic heart failure   ARF (acute renal failure)   Dehydration    Trichomonas   PAST MEDICAL HISTORY: Past Medical History  Diagnosis Date  . CHF (congestive heart failure)   . Diabetes mellitus   . Asthma   . Gout   . Shortness of breath   . Hypertension   . Stroke   . Headache   . Anxiety   . COPD (chronic obstructive pulmonary disease)   . Depression     DISCHARGE MEDICATIONS:   Medication List    STOP taking these medications       colchicine 0.6 MG tablet     cyclobenzaprine 10 MG tablet  Commonly known as:  FLEXERIL     metFORMIN 500 MG tablet  Commonly known as:  GLUCOPHAGE      TAKE these medications       albuterol 108 (90 BASE) MCG/ACT inhaler  Commonly known as:  PROVENTIL HFA;VENTOLIN HFA  Inhale 2 puffs into the lungs every  4 (four) hours as needed. For shortness of breath.     budesonide-formoterol 160-4.5 MCG/ACT inhaler  Commonly known as:  SYMBICORT  Inhale 2 puffs into the lungs 2 (two) times daily.     carvedilol 25 MG tablet  Commonly known as:  COREG  Take 1 tablet (25 mg total) by mouth 2 (two) times daily with a meal.     cloNIDine 0.2 MG tablet  Commonly known as:  CATAPRES  Take 0.1 mg by mouth 2 (two) times daily.     digoxin 0.25 MG tablet  Commonly known as:  LANOXIN  Take 0.25 mg by mouth daily.     furosemide 40 MG tablet  Commonly known as:  LASIX  Take 40 mg by mouth daily.     insulin aspart 100 UNIT/ML injection  Commonly known as:  novoLOG  Inject 0-20 Units into the skin 3 (three) times daily with meals. 0-20 Units, Subcutaneous, 3 times daily with meals  CBG < 70: call MD-and take high sugar content food right away.  CBG 70 - 120: 0 units  CBG 121 - 150: 3 units  CBG 151 - 200: 4 units  CBG 201 - 250: 7 units  CBG 251 - 300: 11 units  CBG 301 - 350: 15 units  CBG 351 - 400: 20 units  CBG > 400: call MD     insulin aspart 100 unit/mL Soln FlexPen  Commonly known as:  NOVOLOG FLEXPEN  Inject 6 Units into the skin 3 (three) times daily with meals.     Insulin Glargine 100 UNIT/ML Sopn  Commonly known as:  LANTUS SOLOSTAR  Inject 30 Units into the skin at bedtime.     Insulin Pen Starter Kit Misc  1 kit by Other route once.     lisinopril 40 MG tablet  Commonly known as:  PRINIVIL,ZESTRIL  Take 1 tablet (40 mg total) by mouth daily. RESUME ON Feb 26, 2013.     oxyCODONE-acetaminophen 10-325 MG per tablet  Commonly known as:  PERCOCET  Take 1 tablet by mouth every 4 (four) hours as needed for pain.     pravastatin 20 MG tablet  Commonly known as:  PRAVACHOL  Take 1 tablet (20 mg total) by mouth daily.     spironolactone 25 MG tablet  Commonly known as:  ALDACTONE  Take 25 mg by mouth daily.     XARELTO 20 MG Tabs  Generic drug:  Rivaroxaban   Take 20 mg by mouth daily.        ALLERGIES:   Allergies  Allergen Reactions  . Banana     Sick     BRIEF HPI: 57 y.o. male with a medical history of chronic systolic heart failure, diabetes, history of venous thromboembolism, and hypertension who presented with polyuria and polydipsia. Per patient approximately 2 weeks prior to admission he had diarrhea that resolved spontaneously. He then started feeling somewhat weak. Over a few days prior to admission he had worsening polyuria and polydipsia. Upon checking his sugars at home, his machine was only able to read "high". He then started vomiting. He presented to the emergency room for further evaluation, where he was found to have glucose of >600, anion gap of 16 and was also found to be in renal failure. The patient was admitted to the SDU   CONSULTATIONS:   None  PERTINENT RADIOLOGIC STUDIES: No results found.   PERTINENT LAB RESULTS: CBC:  Recent Labs  02/16/13 1737 02/18/13 0400  WBC 12.3* 8.4  HGB 14.7 12.8*  HCT 42.3 37.8*  PLT 321 265   CMET CMP     Component Value Date/Time   NA 136 02/19/2013 0525   K 4.2 02/19/2013 0525   CL 103 02/19/2013 0525   CO2 24 02/19/2013 0525   GLUCOSE 314* 02/19/2013 0525   BUN 21 02/19/2013 0525   CREATININE 1.55* 02/19/2013 0525   CALCIUM 9.1 02/19/2013 0525   PROT 7.7 02/16/2013 1332   ALBUMIN 4.1 02/16/2013 1332   AST 16 02/16/2013 1332   ALT 21 02/16/2013 1332   ALKPHOS 83 02/16/2013 1332   BILITOT 0.6 02/16/2013 1332   GFRNONAA 48* 02/19/2013 0525   GFRAA 56* 02/19/2013 0525    GFR Estimated Creatinine Clearance: 73.8 ml/min (by C-G formula based on Cr of 1.55). No results found for this basename: LIPASE, AMYLASE,  in the last 72 hours No results found for this basename: CKTOTAL, CKMB, CKMBINDEX, TROPONINI,  in the last 72 hours No components found with this basename: POCBNP,  No results found for this basename: DDIMER,  in the last 72 hours  Recent Labs  02/16/13 1737   HGBA1C 10.7*   No results found for this basename: CHOL, HDL, LDLCALC, TRIG, CHOLHDL, LDLDIRECT,  in the last 72 hours No results found for this basename: TSH, T4TOTAL, FREET3, T3FREE, THYROIDAB,  in the last 72 hours No results found for this basename: VITAMINB12, FOLATE, FERRITIN,  TIBC, IRON, RETICCTPCT,  in the last 72 hours Coags: No results found for this basename: PT, INR,  in the last 72 hours Microbiology: Recent Results (from the past 240 hour(s))  MRSA PCR SCREENING     Status: None   Collection Time    02/16/13  4:49 PM      Result Value Range Status   MRSA by PCR NEGATIVE  NEGATIVE Final   Comment:            The GeneXpert MRSA Assay (FDA     approved for NASAL specimens     only), is one component of a     comprehensive MRSA colonization     surveillance program. It is not     intended to diagnose MRSA     infection nor to guide or     monitor treatment for     MRSA infections.     BRIEF HOSPITAL COURSE:   Principal Problem:   Hyperosmolar non-ketotic state in patient with type 2 diabetes mellitus Active Problems:   DM   HYPERLIPIDEMIA   HYPERTENSION   EMPHYSEMA   Obesities, morbid   VTE (venous thromboembolism)   Chronic systolic heart failure   ARF (acute renal failure)   Dehydration    Trichomonas   Hyperosmolar non-ketotic state in patient with type 2 diabetes mellitus  - On admission he was hydrated and placed on IV insulin, he was then transitioned to SQ insulin-Lantus and Novolog. CBG's still not at range with readings in 300's range, but patient is wanting to go home today, he claims he will go to his PCP for further optimization of his diabetic regimen.He also claims that if sugars are significantly elevated he will come back to the ED. He is being discharged home at his own request. He is accepting all risks of uncontrolled DM including DKA/Hyperosmolar hyperglycemic state and metabolic derangements.He has a follow up appointment with his PCP next  Tuesday, I have instructed him to call her office and make a sooner appointment - Will continue to hold Metformin on discharge as creatinine is still 1.55 on discharge - At time of discharge, patient will need prescription for insulin pens, insulin pen needles, glucometer, testing strips, and lancets  -At discharge began on 30 units Lantus qhs and 6 units Novolog TID before meals   Trichomonas  -positive in urine -treated with 2grams Flagyl po  -educated on importance of informing partners   Hx of multiple chronic VTE (venous thromboembolism)  - cont Xarelto   Acute kidney failure/injury  -peak Scr 2.45 at admit- now down to 1.55, BUN normalized to 21  -baseline Scr 0.96 in 2012 and 1.22 in 2013  -due to [facility] from hyperglycemia, with 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 Jacinto Halim is primary OP cardiologist.His most recent Echo done by his PCP showed significant improvement in his EF. -resume diuretics on discharge-i.e Lasix and Aldactone -holding ACEI on discharge as creatinine still has not normalized  yet,please check creatinine at next visit with PCP and restart ACEI accordingly.  TODAY-DAY OF DISCHARGE:  Subjective:   Todd Sullivan today has no headache,no chest abdominal pain,no new weakness tingling or numbness, feels much better wants to go home today. Reports symptoms of polyuria, polydipsia, nausea, vomiting have dissipated.  Objective:   Blood pressure 123/79, pulse 66, temperature 97.7 F (36.5 C), temperature  source Oral, resp. rate 16, height 6' (1.829 m), weight 128.8 kg (283 lb 15.2 oz), SpO2 98.00%.  Intake/Output Summary (Last 24 hours) at 02/19/13 1127 Last data filed at 02/19/13 0945  Gross per 24 hour  Intake    942 ml  Output    880 ml  Net     62 ml   Filed Weights   02/16/13 1657  Weight: 128.8 kg (283 lb 15.2 oz)    Exam Awake Alert, Oriented x3, Cranial Nerves II-XII grossly intact, No new F.N deficits, Normal affect [place].AT,PERRAL,  Supple Neck,No JVD, No cervical lymphadenopathy appriciated.  Symmetrical Chest wall movement, no use of accessory muscles, Good air movement bilaterally, CTAB Regular Rate and Rhythm, No Gallops,Rubs or new Murmurs, No Parasternal Heave Positive Bowel Sounds, Abd Soft, Non distended, Non tender, No organomegaly appriciated, No rebound -guarding or rigidity. Moves all extremities spontaneously, No edema No Cyanosis, Clubbing or edema, No new Rash  DISCHARGE CONDITION: Stable  DISPOSITION:  Home   DISCHARGE INSTRUCTIONS:    Activity:  Full activity level permitted   Diet recommendation: Carb Modified diet  Heart Healthy diet   Discharge Orders   Future Orders Complete By Expires     Diet Carb Modified  As directed     Increase activity slowly  As directed        Follow-up Information   Follow up with REESE,BETTI D, MD. Schedule an appointment as soon as possible for a visit in 5 days.   Contact information:   [address] [place] [address] [phone]       Follow up with Pamella Pert, MD. Schedule an appointment as soon as possible for a visit in 2 weeks.   Contact information:   [address] [place] [address] [phone]         Total Time spent on discharge equals 45 minutes.  SignedJeoffrey Massed PA-S 02/19/2013 11:27 AM

## 2013-02-19 NOTE — Care Management Note (Signed)
    Page 1 of 1   02/19/2013     2:34:59 PM   CARE MANAGEMENT NOTE 02/19/2013  Patient:  Todd Sullivan, Todd Sullivan   Account Number:  [id number]  Date Initiated:  02/18/2013  Documentation initiated by:  Donn Pierini  Subjective/Objective Assessment:   Pt admitted with DKA     Action/Plan:   PTA pt lived at home with spouse- independent- NCM to follow for d/c needs   Anticipated DC Date:  02/19/2013   Anticipated DC Plan:  HOME/SELF CARE      DC Planning Services  CM consult      Choice offered to / List presented to:             Status of service:  Completed, signed off Medicare Important Message given?   (If response is "NO", the following Medicare IM given date fields will be blank) Date Medicare IM given:   Date Additional Medicare IM given:    Discharge Disposition:  HOME/SELF CARE  Per UR Regulation:  Reviewed for med. necessity/level of care/duration of stay  If discussed at Long Length of Stay Meetings, dates discussed:    Comments:  02/19/13 14:18 Letha Cape RN, BSN [phone] patient is from home with family.  No needs anticipated.

## 2013-02-20 ENCOUNTER — Other Ambulatory Visit: Payer: Self-pay | Admitting: Physician Assistant

## 2013-03-19 ENCOUNTER — Encounter (HOSPITAL_COMMUNITY): Payer: Self-pay

## 2013-03-19 ENCOUNTER — Ambulatory Visit (HOSPITAL_COMMUNITY)
Admission: RE | Admit: 2013-03-19 | Discharge: 2013-03-19 | Disposition: A | Discharge status: Home / Self Care | Payer: Medicare Other | Source: Ambulatory Visit | Attending: Internal Medicine | Admitting: Internal Medicine

## 2013-03-19 VITALS — BP 100/62 | HR 61 | Wt 295.4 lb

## 2013-03-19 DIAGNOSIS — I5022 Chronic systolic (congestive) heart failure: Secondary | ICD-10-CM | POA: Insufficient documentation

## 2013-03-19 DIAGNOSIS — I509 Heart failure, unspecified: Secondary | ICD-10-CM | POA: Insufficient documentation

## 2013-03-19 NOTE — Assessment & Plan Note (Addendum)
He returns for post hospital follow up. Reviewed discharged summary. Volume status stable. Continue current regimen. Stop digoxin as EF recovered. He refused BMET today but plans to follow up with PCP next week and fax results. Reinforced daily weights, low salt food choices, and medication compliance. Follow up in 6 months.

## 2013-03-19 NOTE — Progress Notes (Signed)
Patient ID: Todd Sullivan, male   DOB: Mar 10, 1956, 57 y.o.   MRN: [id number] Primary Cardiologist: Dr. Jacinto Halim PCP: Dr Pecola Leisure  Weight Range  288-290 lbs  Baseline proBNP  3111 on 12/12    HPI: Todd Sullivan is a 57 y/o man with morbid obesity, DM2, OSA, moderate to severe COPD with ongoing tobacco use (previously seen by Dr. Shelle Iron), previous CVA x 2, HTN, h/o PE 2006, CHF with biventricular dysfunction due to presumed NICM, EF previously 25% with RV dysfunction by ECHO 09/2011. Echo 04/19/12 EF 35-40% Marked LVH. Normal RV function. No PAH. He was referred by Dr. Jacinto Halim for assistance with HF management.   Was first diagnosed with HF in 2007 when he was in [place] and developed orthopnea - had 4 hospitalizations there. Says he has never had a cath or other invasive procedures on his heart. Moved to [place] in 2009. Was admitted to [facility] in December 2012 with respiratory failure due to COPD and CHF in setting of severe HTN. Stress test or cath recommended. Echo at the time showed mildly dilated LV, LVEF 20-25% with severe concentric hypertrophy. Pseudonormal filling pattern. LA severly dilated. RV systolic function moderately reduced. RA mod dilated.   Stress test with Dr. Jacinto Halim 06/2012 which showed EF approx 58% with normal wall motion, endocardial thickening, and prominent RV. There was mild inferior and infero-apical ischemia vs attenuation artifact.   10/2012 ECHO EF 50%  Admited to [facility] 02/16/13 through 02/20/12 with N/V/D. Glucsoe >600 and creatinine 2.45. Lisinopril and diuretics held in hospital. D/C creatinine 1.55.  Lisinopril was to be restarted 02/27/12.  Lasix restarted 40 mg daily. Also treated for trichomonas and given 2 grams of flagyl.   He returns for follow up. Denies SOB/PND/Orthopnea/CP/dizziness. Weight 290-295 pounds. Compliant with medications. Blood sugar < 200 at home. He will follow up with Dr Pecola Leisure next week for DM management.     ROS: All systems negative except as listed in HPI, PMH and  Problem List.  Past Medical History  Diagnosis Date  . CHF (congestive heart failure)   . Diabetes mellitus   . Asthma   . Gout   . Shortness of breath   . Hypertension   . Stroke   . Headache(784.0)   . Anxiety   . COPD (chronic obstructive pulmonary disease)   . Depression     Current Outpatient Prescriptions  Medication Sig Dispense Refill  . albuterol (PROVENTIL HFA;VENTOLIN HFA) 108 (90 BASE) MCG/ACT inhaler Inhale 2 puffs into the lungs every 4 (four) hours as needed. For shortness of breath.  1 Inhaler  3  . budesonide-formoterol (SYMBICORT) 160-4.5 MCG/ACT inhaler Inhale 2 puffs into the lungs 2 (two) times daily.  1 Inhaler  3  . carvedilol (COREG) 25 MG tablet Take 1 tablet (25 mg total) by mouth 2 (two) times daily with a meal.  60 tablet  4  . cloNIDine (CATAPRES) 0.2 MG tablet Take 0.1 mg by mouth 2 (two) times daily.      . digoxin (LANOXIN) 0.25 MG tablet Take 0.25 mg by mouth daily.      . furosemide (LASIX) 40 MG tablet Take 40 mg by mouth daily.      . insulin aspart (NOVOLOG FLEXPEN) 100 unit/mL SOLN FlexPen Inject 6 Units into the skin 3 (three) times daily with meals.  1 pen  12  . insulin aspart (NOVOLOG) 100 UNIT/ML injection Inject 0-20 Units into the skin 3 (three) times daily with meals. 0-20 Units, Subcutaneous, 3 times daily with  meals CBG < 70: call MD-and take high sugar content food right away. CBG 70 - 120: 0 units CBG 121 - 150: 3 units CBG 151 - 200: 4 units CBG 201 - 250: 7 units CBG 251 - 300: 11 units CBG 301 - 350: 15 units CBG 351 - 400: 20 units CBG > 400: call MD  1 vial    . Insulin Glargine (LANTUS SOLOSTAR) 100 UNIT/ML SOPN Inject 30 Units into the skin at bedtime.  1 pen  12  . Insulin Pen Starter Kit MISC 1 kit by Other route once.      Marland Kitchen lisinopril (PRINIVIL,ZESTRIL) 40 MG tablet Take 1 tablet (40 mg total) by mouth daily. RESUME ON Feb 26, 2013.  30 tablet  2  . oxyCODONE-acetaminophen (PERCOCET) 10-325 MG per tablet Take 1 tablet  by mouth every 4 (four) hours as needed for pain.       . pravastatin (PRAVACHOL) 20 MG tablet Take 1 tablet (20 mg total) by mouth daily.  30 tablet  2  . Rivaroxaban (XARELTO) 20 MG TABS Take 20 mg by mouth daily.      Marland Kitchen spironolactone (ALDACTONE) 25 MG tablet Take 25 mg by mouth daily.       No current facility-administered medications for this encounter.     PHYSICAL EXAM: Filed Vitals:   03/19/13 0916  BP: 100/62  Pulse: 61  Weight: 295 lb 6.4 oz (133.993 kg)  SpO2: 97%    General:  Well appearing. No resp difficulty HEENT: normal Neck: supple. JVP flat Carotids 2+ bilaterally; no bruits. No lymphadenopathy or thryomegaly appreciated. Cor: PMI normal. Distant heart sounds; Regular rate & rhythm. No rubs, gallops or murmurs. Lungs: CTA  Abdomen: obese soft, nontender, nondistended.Good bowel sounds. Extremities: no cyanosis, clubbing, rash, edema Neuro: alert & orientedx3, cranial nerves grossly intact. Moves all 4 extremities w/o difficulty. Affect pleasant.    ASSESSMENT & PLAN:

## 2013-03-19 NOTE — Patient Instructions (Addendum)
Follow up 6 months.   Please send lab results from your office visit next week.   Stop digoxin  Do the following things EVERYDAY: 1) Weigh yourself in the morning before breakfast. Write it down and keep it in a log. 2) Take your medicines as prescribed 3) Eat low salt foods-Limit salt (sodium) to 2000 mg per day.  4) Stay as active as you can everyday 5) Limit all fluids for the day to less than 2 liters

## 2013-09-17 IMAGING — CR DG CHEST 2V
2 series · 2 of 2 positions shown · non-contrast
Comparison: Chest x-ray 02/09/2009.

CLINICAL DATA: Cough and shortness of breath.

CHEST - 2 VIEW

[w chest pa]
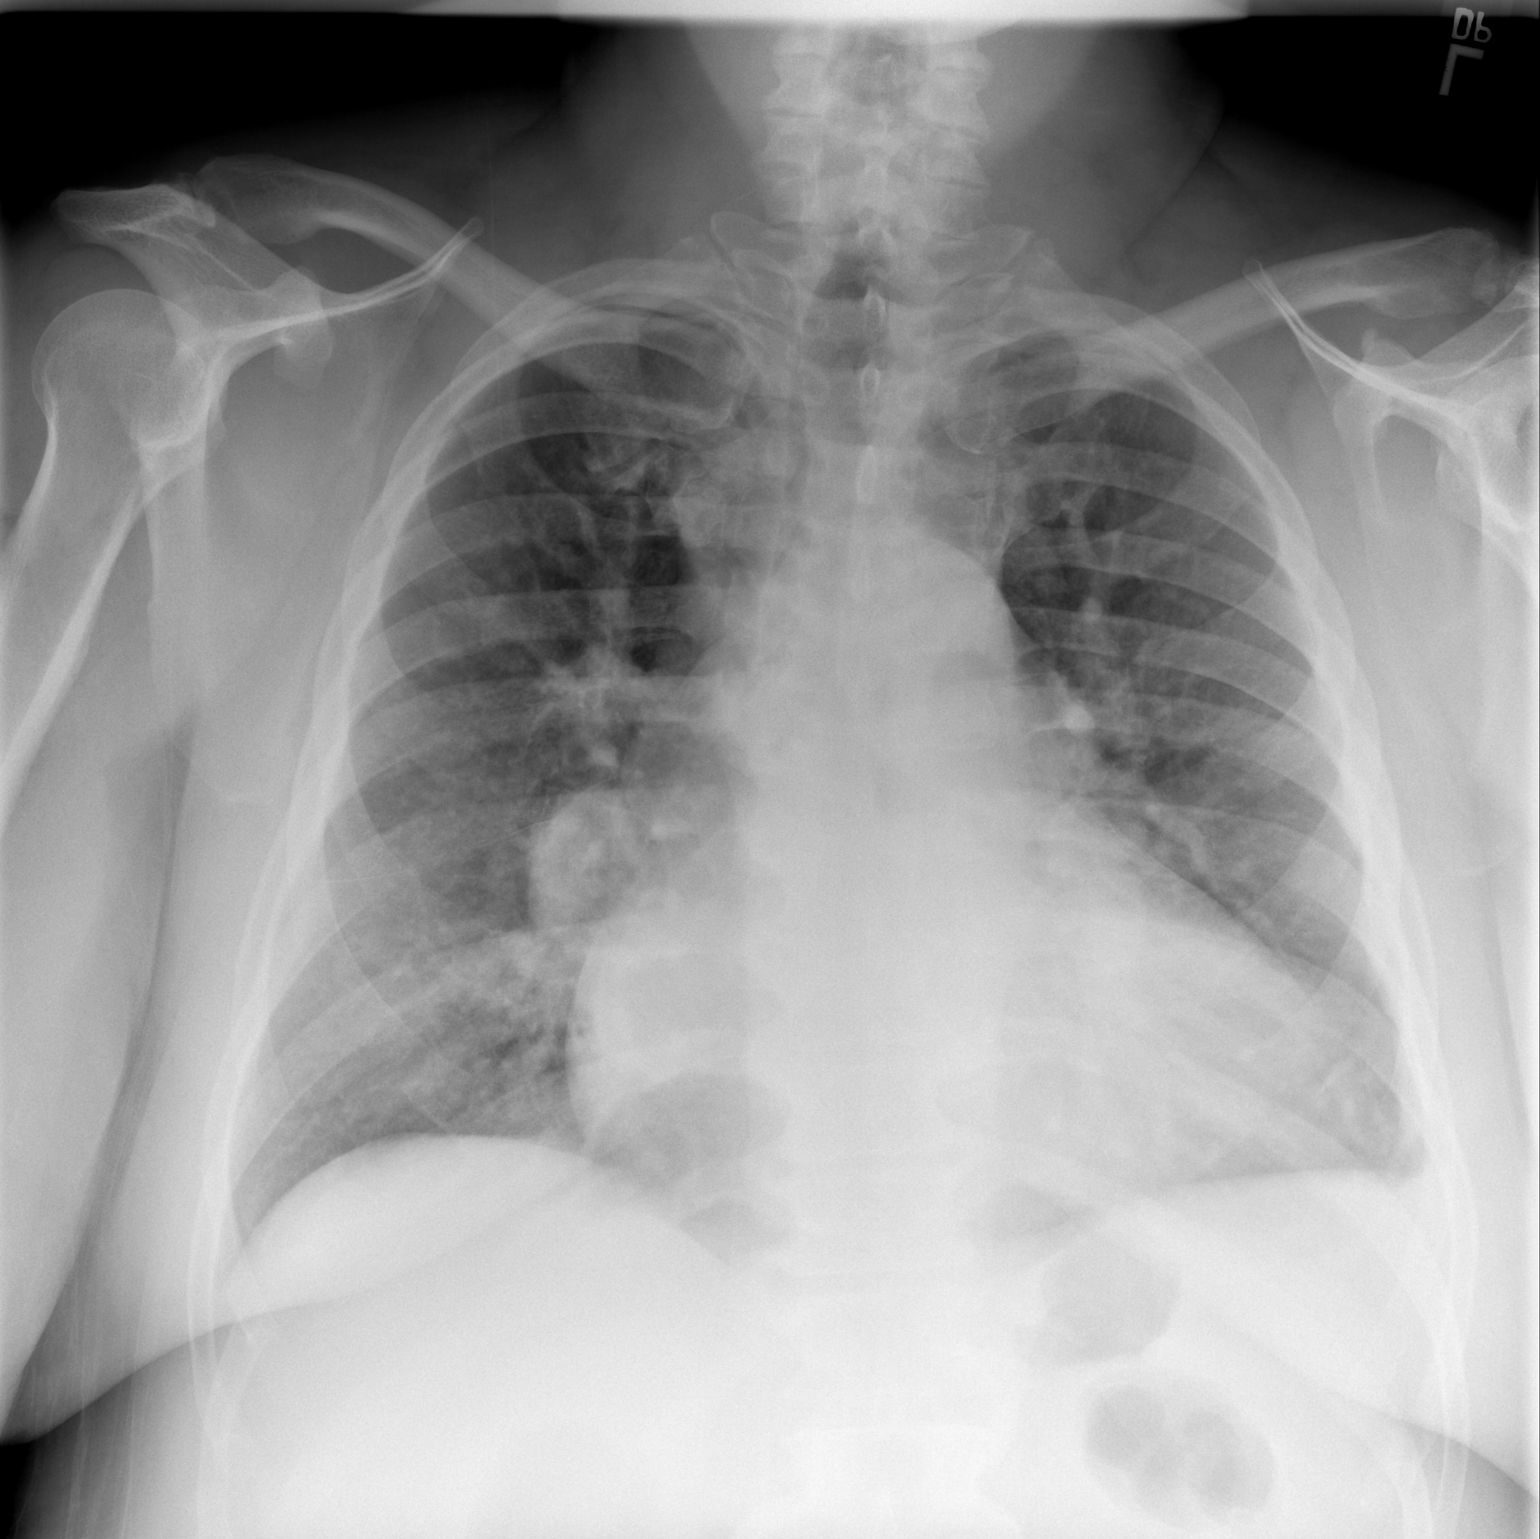

[w chest lat]
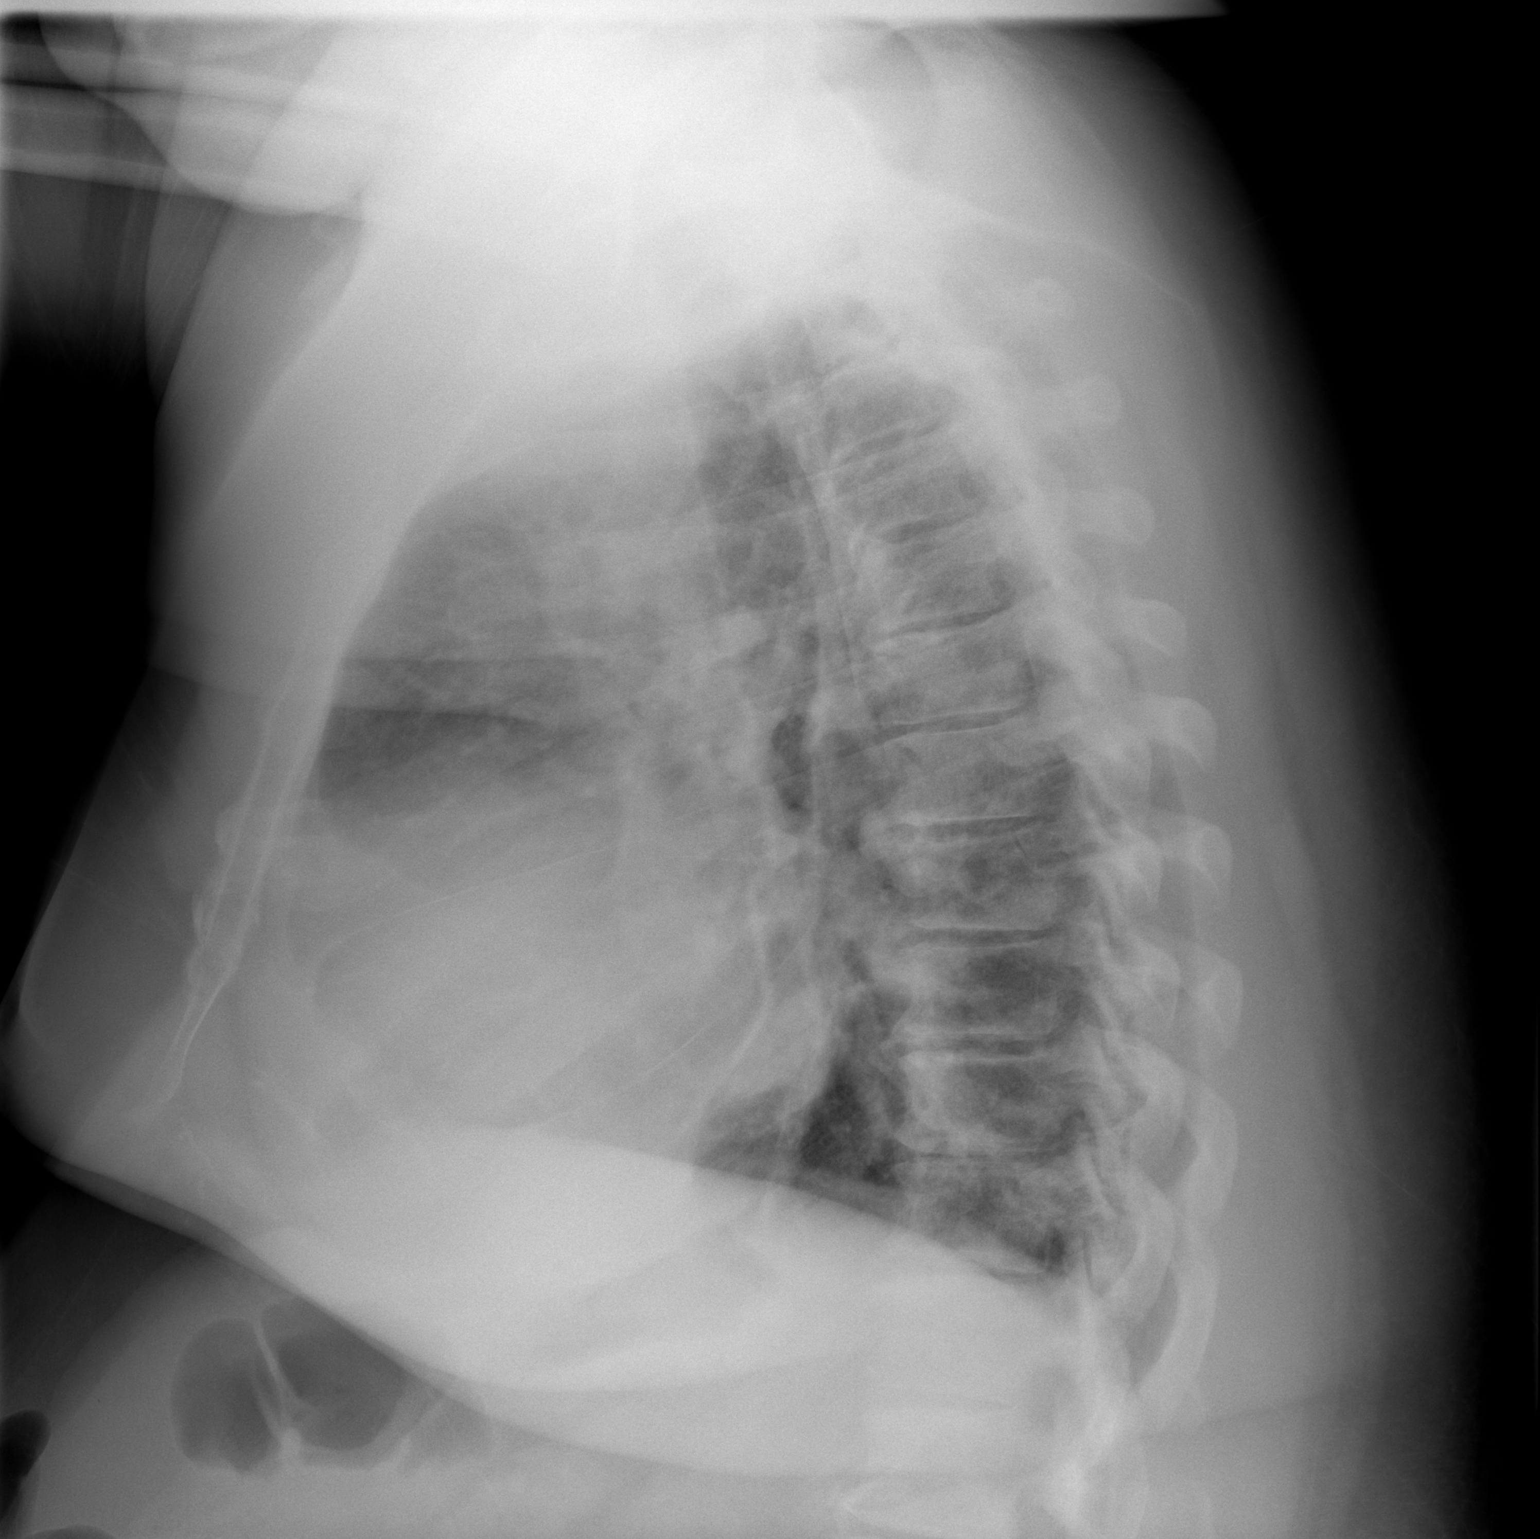

[2 of 2 positions shown; findings below may reference images not displayed]

FINDINGS: The heart is enlarged.  There is moderate central
vascular congestion and possible mild edema.  Stable left pleural
thickening versus chronic pleural effusion.  No definite
infiltrates.
IMPRESSION: Cardiac enlargement, new since prior chest x-ray with moderate
vascular congestion and possible mild edema.

## 2017-07-06 ENCOUNTER — Inpatient Hospital Stay (HOSPITAL_COMMUNITY)
Admission: EM | Admit: 2017-07-06 | Discharge: 2017-07-09 | DRG: 246 | Disposition: A | Discharge status: Home / Self Care | Payer: Medicare Other | Attending: Cardiology | Admitting: Cardiology

## 2017-07-06 ENCOUNTER — Other Ambulatory Visit: Payer: Self-pay

## 2017-07-06 ENCOUNTER — Encounter (HOSPITAL_COMMUNITY): Payer: Self-pay

## 2017-07-06 ENCOUNTER — Emergency Department (HOSPITAL_COMMUNITY): Payer: Medicare Other

## 2017-07-06 ENCOUNTER — Inpatient Hospital Stay (HOSPITAL_COMMUNITY)
Admission: EM | Disposition: A | Discharge status: Home / Self Care | Payer: Self-pay | Source: Home / Self Care | Attending: Cardiology

## 2017-07-06 DIAGNOSIS — E785 Hyperlipidemia, unspecified: Secondary | ICD-10-CM | POA: Diagnosis present

## 2017-07-06 DIAGNOSIS — J449 Chronic obstructive pulmonary disease, unspecified: Secondary | ICD-10-CM | POA: Diagnosis present

## 2017-07-06 DIAGNOSIS — D72829 Elevated white blood cell count, unspecified: Secondary | ICD-10-CM | POA: Diagnosis present

## 2017-07-06 DIAGNOSIS — I493 Ventricular premature depolarization: Secondary | ICD-10-CM | POA: Diagnosis present

## 2017-07-06 DIAGNOSIS — F419 Anxiety disorder, unspecified: Secondary | ICD-10-CM | POA: Diagnosis present

## 2017-07-06 DIAGNOSIS — R0603 Acute respiratory distress: Secondary | ICD-10-CM | POA: Diagnosis not present

## 2017-07-06 DIAGNOSIS — E1122 Type 2 diabetes mellitus with diabetic chronic kidney disease: Secondary | ICD-10-CM | POA: Diagnosis present

## 2017-07-06 DIAGNOSIS — R0602 Shortness of breath: Secondary | ICD-10-CM

## 2017-07-06 DIAGNOSIS — M109 Gout, unspecified: Secondary | ICD-10-CM | POA: Diagnosis present

## 2017-07-06 DIAGNOSIS — I2119 ST elevation (STEMI) myocardial infarction involving other coronary artery of inferior wall: Secondary | ICD-10-CM | POA: Diagnosis present

## 2017-07-06 DIAGNOSIS — I5043 Acute on chronic combined systolic (congestive) and diastolic (congestive) heart failure: Secondary | ICD-10-CM | POA: Diagnosis present

## 2017-07-06 DIAGNOSIS — F329 Major depressive disorder, single episode, unspecified: Secondary | ICD-10-CM | POA: Diagnosis present

## 2017-07-06 DIAGNOSIS — Z794 Long term (current) use of insulin: Secondary | ICD-10-CM | POA: Diagnosis not present

## 2017-07-06 DIAGNOSIS — F17210 Nicotine dependence, cigarettes, uncomplicated: Secondary | ICD-10-CM | POA: Diagnosis present

## 2017-07-06 DIAGNOSIS — I130 Hypertensive heart and chronic kidney disease with heart failure and stage 1 through stage 4 chronic kidney disease, or unspecified chronic kidney disease: Secondary | ICD-10-CM | POA: Diagnosis present

## 2017-07-06 DIAGNOSIS — I2510 Atherosclerotic heart disease of native coronary artery without angina pectoris: Secondary | ICD-10-CM | POA: Diagnosis present

## 2017-07-06 DIAGNOSIS — N183 Chronic kidney disease, stage 3 (moderate): Secondary | ICD-10-CM | POA: Diagnosis present

## 2017-07-06 DIAGNOSIS — Z79899 Other long term (current) drug therapy: Secondary | ICD-10-CM | POA: Diagnosis not present

## 2017-07-06 DIAGNOSIS — Z8673 Personal history of transient ischemic attack (TIA), and cerebral infarction without residual deficits: Secondary | ICD-10-CM | POA: Diagnosis not present

## 2017-07-06 DIAGNOSIS — I428 Other cardiomyopathies: Secondary | ICD-10-CM | POA: Diagnosis present

## 2017-07-06 DIAGNOSIS — Z7982 Long term (current) use of aspirin: Secondary | ICD-10-CM

## 2017-07-06 DIAGNOSIS — Z86711 Personal history of pulmonary embolism: Secondary | ICD-10-CM

## 2017-07-06 DIAGNOSIS — I213 ST elevation (STEMI) myocardial infarction of unspecified site: Secondary | ICD-10-CM

## 2017-07-06 DIAGNOSIS — Z6841 Body Mass Index (BMI) 40.0 and over, adult: Secondary | ICD-10-CM | POA: Diagnosis not present

## 2017-07-06 DIAGNOSIS — Z955 Presence of coronary angioplasty implant and graft: Secondary | ICD-10-CM

## 2017-07-06 DIAGNOSIS — R079 Chest pain, unspecified: Secondary | ICD-10-CM | POA: Diagnosis present

## 2017-07-06 HISTORY — PX: CORONARY/GRAFT ACUTE MI REVASCULARIZATION: CATH118305

## 2017-07-06 HISTORY — PX: LEFT HEART CATH AND CORONARY ANGIOGRAPHY: CATH118249

## 2017-07-06 LAB — CBC WITH DIFFERENTIAL/PLATELET
BASOS PCT: 0 %
Basophils Absolute: 0 10*3/uL (ref 0.0–0.1)
EOS ABS: 0 10*3/uL (ref 0.0–0.7)
Eosinophils Relative: 0 %
HCT: 39.1 % (ref 39.0–52.0)
Hemoglobin: 12 g/dL — ABNORMAL LOW (ref 13.0–17.0)
LYMPHS ABS: 2.3 10*3/uL (ref 0.7–4.0)
Lymphocytes Relative: 19 %
MCH: 24.8 pg — AB (ref 26.0–34.0)
MCHC: 30.7 g/dL (ref 30.0–36.0)
MCV: 80.8 fL (ref 78.0–100.0)
MONOS PCT: 11 %
Monocytes Absolute: 1.4 10*3/uL — ABNORMAL HIGH (ref 0.1–1.0)
Neutro Abs: 8.7 10*3/uL — ABNORMAL HIGH (ref 1.7–7.7)
Neutrophils Relative %: 70 %
PLATELETS: 289 10*3/uL (ref 150–400)
RBC: 4.84 MIL/uL (ref 4.22–5.81)
RDW: 15.9 % — ABNORMAL HIGH (ref 11.5–15.5)
WBC: 12.4 10*3/uL — ABNORMAL HIGH (ref 4.0–10.5)

## 2017-07-06 LAB — COMPREHENSIVE METABOLIC PANEL
ALBUMIN: 3.4 g/dL — AB (ref 3.5–5.0)
ALT: 48 U/L (ref 17–63)
ANION GAP: 8 (ref 5–15)
AST: 147 U/L — ABNORMAL HIGH (ref 15–41)
Alkaline Phosphatase: 61 U/L (ref 38–126)
BUN: 14 mg/dL (ref 6–20)
CALCIUM: 9 mg/dL (ref 8.9–10.3)
CHLORIDE: 105 mmol/L (ref 101–111)
CO2: 23 mmol/L (ref 22–32)
Creatinine, Ser: 1.43 mg/dL — ABNORMAL HIGH (ref 0.61–1.24)
GFR calc non Af Amer: 52 mL/min — ABNORMAL LOW (ref >60)
Glucose, Bld: 150 mg/dL — ABNORMAL HIGH (ref 65–99)
Potassium: 4 mmol/L (ref 3.5–5.1)
SODIUM: 136 mmol/L (ref 135–145)
Total Bilirubin: 1.4 mg/dL — ABNORMAL HIGH (ref 0.3–1.2)
Total Protein: 6.6 g/dL (ref 6.5–8.1)

## 2017-07-06 LAB — TROPONIN I: TROPONIN I: 36.57 ng/mL — AB (ref <0.03)

## 2017-07-06 LAB — BRAIN NATRIURETIC PEPTIDE: B Natriuretic Peptide: 1406.7 pg/mL — ABNORMAL HIGH (ref 0.0–100.0)

## 2017-07-06 SURGERY — LEFT HEART CATH AND CORONARY ANGIOGRAPHY
Anesthesia: LOCAL

## 2017-07-06 MED ORDER — NITROGLYCERIN 1 MG/10 ML FOR IR/CATH LAB
INTRA_ARTERIAL | Status: AC
  Filled 2017-07-06: qty 10

## 2017-07-06 MED ORDER — IOPAMIDOL (ISOVUE-370) INJECTION 76%
INTRAVENOUS | Status: DC | PRN
  Administered 2017-07-06: 325 mL via INTRA_ARTERIAL

## 2017-07-06 MED ORDER — HEPARIN SODIUM (PORCINE) 1000 UNIT/ML IJ SOLN
INTRAMUSCULAR | Status: DC | PRN
  Administered 2017-07-06 (×2): 4000 [IU] via INTRAVENOUS
  Administered 2017-07-06: 2000 [IU] via INTRAVENOUS

## 2017-07-06 MED ORDER — TICAGRELOR 90 MG PO TABS: 180.0000 mg | ORAL_TABLET | Freq: Once | ORAL | Status: AC

## 2017-07-06 MED ORDER — HEPARIN SODIUM (PORCINE) 1000 UNIT/ML IJ SOLN
INTRAMUSCULAR | Status: AC
  Filled 2017-07-06: qty 1

## 2017-07-06 MED ORDER — IOPAMIDOL (ISOVUE-370) INJECTION 76%
INTRAVENOUS | Status: AC
  Filled 2017-07-06: qty 125

## 2017-07-06 MED ORDER — MIDAZOLAM HCL 2 MG/2ML IJ SOLN
INTRAMUSCULAR | Status: AC
  Filled 2017-07-06: qty 2

## 2017-07-06 MED ORDER — SODIUM CHLORIDE 0.9 % IV SOLN: INTRAVENOUS | Status: DC

## 2017-07-06 MED ORDER — ALBUTEROL SULFATE (2.5 MG/3ML) 0.083% IN NEBU
INHALATION_SOLUTION | RESPIRATORY_TRACT | Status: AC
  Filled 2017-07-06: qty 6

## 2017-07-06 MED ORDER — ASPIRIN 81 MG PO CHEW
324.0000 mg | CHEWABLE_TABLET | Freq: Once | ORAL | Status: AC
Start: 2017-07-06 — End: 2017-07-06
  Administered 2017-07-06: 324 mg via ORAL
  Filled 2017-07-06: qty 4

## 2017-07-06 MED ORDER — VERAPAMIL HCL 2.5 MG/ML IV SOLN
INTRAVENOUS | Status: AC
  Filled 2017-07-06: qty 2

## 2017-07-06 MED ORDER — TICAGRELOR 90 MG PO TABS
ORAL_TABLET | ORAL | Status: AC
  Filled 2017-07-06: qty 1

## 2017-07-06 MED ORDER — FENTANYL CITRATE (PF) 100 MCG/2ML IJ SOLN
INTRAMUSCULAR | Status: AC
  Filled 2017-07-06: qty 2

## 2017-07-06 MED ORDER — HEPARIN (PORCINE) IN NACL 2-0.9 UNIT/ML-% IJ SOLN
INTRAMUSCULAR | Status: AC | PRN
  Administered 2017-07-06: 1000 mL

## 2017-07-06 MED ORDER — LIDOCAINE HCL (PF) 1 % IJ SOLN
INTRAMUSCULAR | Status: DC | PRN
  Administered 2017-07-06: 5 mL

## 2017-07-06 MED ORDER — ALBUTEROL SULFATE (2.5 MG/3ML) 0.083% IN NEBU
5.0000 mg | INHALATION_SOLUTION | Freq: Once | RESPIRATORY_TRACT | Status: AC
Start: 2017-07-06 — End: 2017-07-06
  Administered 2017-07-06: 5 mg via RESPIRATORY_TRACT

## 2017-07-06 MED ORDER — FENTANYL CITRATE (PF) 100 MCG/2ML IJ SOLN
INTRAMUSCULAR | Status: DC | PRN
  Administered 2017-07-06: 25 ug via INTRAVENOUS

## 2017-07-06 MED ORDER — HEPARIN SODIUM (PORCINE) 5000 UNIT/ML IJ SOLN
4000.0000 [IU] | Freq: Once | INTRAMUSCULAR | Status: AC
  Administered 2017-07-06: 4000 [IU] via INTRAVENOUS
  Filled 2017-07-06: qty 1

## 2017-07-06 MED ORDER — HEPARIN (PORCINE) IN NACL 2-0.9 UNIT/ML-% IJ SOLN
INTRAMUSCULAR | Status: AC
  Filled 2017-07-06: qty 1000

## 2017-07-06 MED ORDER — VERAPAMIL HCL 2.5 MG/ML IV SOLN
INTRAVENOUS | Status: DC | PRN
  Administered 2017-07-06 (×2): via INTRA_ARTERIAL

## 2017-07-06 MED ORDER — MIDAZOLAM HCL 2 MG/2ML IJ SOLN
INTRAMUSCULAR | Status: DC | PRN
  Administered 2017-07-06: 1 mg via INTRAVENOUS

## 2017-07-06 MED ORDER — LIDOCAINE HCL 2 % IJ SOLN
INTRAMUSCULAR | Status: AC
  Filled 2017-07-06: qty 10

## 2017-07-06 MED ORDER — LABETALOL HCL 5 MG/ML IV SOLN
INTRAVENOUS | Status: DC | PRN
  Administered 2017-07-06: 20 mg via INTRAVENOUS

## 2017-07-06 MED ORDER — TICAGRELOR 90 MG PO TABS
ORAL_TABLET | ORAL | Status: DC | PRN
  Administered 2017-07-06: 180 mg via ORAL

## 2017-07-06 MED ORDER — LABETALOL HCL 5 MG/ML IV SOLN
INTRAVENOUS | Status: AC
  Filled 2017-07-06: qty 4

## 2017-07-06 SURGICAL SUPPLY — 18 items
BALLN SAPPHIRE 2.5X12 (BALLOONS) ×2
BALLOON SAPPHIRE 2.5X12 (BALLOONS) ×1 IMPLANT
CATH EXPO 5F FL3.5 (CATHETERS) ×2 IMPLANT
CATH LAUNCHER 6FR JL3.5 (CATHETERS) ×2 IMPLANT
CATH VISTA GUIDE 6FR JR4 (CATHETERS) ×2 IMPLANT
DEVICE RAD COMP TR BAND LRG (VASCULAR PRODUCTS) ×2 IMPLANT
GLIDESHEATH SLEND A-KIT 6F 22G (SHEATH) ×2 IMPLANT
GUIDEWIRE INQWIRE 1.5J.035X260 (WIRE) ×1 IMPLANT
INQWIRE 1.5J .035X260CM (WIRE) ×2
KIT ENCORE 26 ADVANTAGE (KITS) ×2 IMPLANT
KIT HEART LEFT (KITS) ×2 IMPLANT
PACK CARDIAC CATHETERIZATION (CUSTOM PROCEDURE TRAY) ×2 IMPLANT
STENT SYNERGY DES 2.5X16 (Permanent Stent) ×2 IMPLANT
TRANSDUCER W/STOPCOCK (MISCELLANEOUS) ×2 IMPLANT
TUBING CIL FLEX 10 FLL-RA (TUBING) ×2 IMPLANT
VALVE GUARDIAN II ~~LOC~~ HEMO (MISCELLANEOUS) ×2 IMPLANT
WIRE ASAHI PROWATER 180CM (WIRE) ×2 IMPLANT
WIRE COUGAR XT STRL 190CM (WIRE) ×4 IMPLANT

## 2017-07-06 NOTE — ED Triage Notes (Signed)
Pt presents with 1 month h/o worsening shortness of breath and non-productive cough.  Pt reports chest tightness and shortness of breath especially with exertion.  Pt out of inhalers, had MD appointment scheduled.

## 2017-07-06 NOTE — H&P (View-Only) (Signed)
Inferior STEMI. Full note to follow.  Elder Negus, MD [facility] Pager: [phone] Office: [phone] If no answer Cell [phone]

## 2017-07-06 NOTE — Progress Notes (Signed)
Inferior STEMI. Full note to follow.  Dorthy Magnussen J Tayia Stonesifer, MD [facility]. [facility] Pager: [phone] Office: [phone] If no answer Cell [phone]   

## 2017-07-06 NOTE — ED Provider Notes (Signed)
[facility] DEPT Provider Note   CSN: [id number] Arrival date & time: 07/06/17  1807     History   Chief Complaint Chief Complaint  Patient presents with  . Shortness of Breath    HPI Wendel Homeyer is a 61 y.o. male.  HPI  61 year old male with a history of asthma/COPD, CHF, diabetes, hypertension presents with shortness of breath. History is somewhat limited due to the acuity of situation. Patient has been short of breath and having cough for about one week. He's been getting a chest pain that he has a hard time describing an best describes as a burning when he exerts himself. After by a half a block he will feel the symptoms. 3 days ago it was at its worst when he was diaphoretic after walking. However most recently today he was also having symptoms. Denies leg swelling. He was recently got his medications refilled as he had not had them for little while. He denies current chest pain but is quite short of breath in the stretcher. His cardiologist is Dr. Einar Gip. Chest pain is also worse with inspiration and coughing.  Past Medical History:  Diagnosis Date  . Anxiety   . Asthma   . CHF (congestive heart failure) ([place])   . COPD (chronic obstructive pulmonary disease) ([place])   . Depression   . Diabetes mellitus   . Gout   . Headache(784.0)   . Hypertension   . Shortness of breath   . Stroke [facility])     Patient Active Problem List   Diagnosis Date Noted  . Hyperosmolar non-ketotic state in patient with type 2 diabetes mellitus ([place]) 02/16/2013  . ARF (acute renal failure) ([place]) 02/16/2013  . Dehydration 02/16/2013  . Chronic systolic heart failure ([place]) 05/06/2012  . Obesities, morbid ([place]) 09/15/2011  . VTE (venous thromboembolism) 09/15/2011  . Gout attack 09/15/2011  . Nicotine abuse 09/15/2011  . Systolic and diastolic CHF, acute on chronic ([place]) 09/15/2011  . Respiratory failure, acute ([place]) 09/14/2011  . COPD exacerbation ([place]) 09/14/2011  . EMPHYSEMA 08/13/2009    . DM 07/30/2009  . HYPERLIPIDEMIA 07/30/2009  . HYPERTENSION 07/30/2009  . CONGESTIVE HEART FAILURE 07/30/2009  . STROKE 07/30/2009  . ALLERGIC RHINITIS 07/30/2009  . DYSPNEA 07/30/2009    Past Surgical History:  Procedure Laterality Date  . EYE SURGERY  61 years old       Home Medications    Prior to Admission medications   Medication Sig Start Date End Date Taking? Authorizing Provider  albuterol (PROVENTIL HFA;VENTOLIN HFA) 108 (90 BASE) MCG/ACT inhaler Inhale 2 puffs into the lungs every 4 (four) hours as needed. For shortness of breath. 09/19/11  Yes Bonnielee Haff, MD  budesonide-formoterol [facility]) 160-4.5 MCG/ACT inhaler Inhale 2 puffs into the lungs 2 (two) times daily. 09/19/11  Yes Bonnielee Haff, MD  carvedilol (COREG) 25 MG tablet Take 1 tablet (25 mg total) by mouth 2 (two) times daily with a meal. 01/04/13 07/06/17 Yes Bensimhon, Shaune Pascal, MD  cloNIDine (CATAPRES) 0.1 MG tablet Take 0.1 mg by mouth 2 (two) times daily.   Yes [provider]  colchicine 0.6 MG tablet Take 0.6 mg by mouth daily. 06/02/17  Yes [provider]  insulin aspart (NOVOLOG FLEXPEN) 100 unit/mL SOLN FlexPen Inject 6 Units into the skin 3 (three) times daily with meals. Patient not taking: Reported on 07/06/2017 02/19/13   Jonetta Osgood, MD  insulin aspart (NOVOLOG) 100 UNIT/ML injection Inject 0-20 Units into the skin 3 (three) times daily with  meals. 0-20 Units, Subcutaneous, 3 times daily with meals CBG < 70: call MD-and take high sugar content food right away. CBG 70 - 120: 0 units CBG 121 - 150: 3 units CBG 151 - 200: 4 units CBG 201 - 250: 7 units CBG 251 - 300: 11 units CBG 301 - 350: 15 units CBG 351 - 400: 20 units CBG > 400: call MD Patient not taking: Reported on 07/06/2017 02/19/13   Jonetta Osgood, MD  Insulin Glargine (LANTUS SOLOSTAR) 100 UNIT/ML SOPN Inject 30 Units into the skin at bedtime. 02/19/13   Ghimire, Henreitta Leber, MD  Insulin Pen Starter Kit  MISC 1 kit by Other route once. 02/19/13   Ghimire, Henreitta Leber, MD  lisinopril (PRINIVIL,ZESTRIL) 40 MG tablet Take 1 tablet (40 mg total) by mouth daily. [place] ON Feb 26, 2013. 02/19/13   Ghimire, Henreitta Leber, MD  oxyCODONE-acetaminophen (PERCOCET) 10-325 MG per tablet Take 1 tablet by mouth every 4 (four) hours as needed for pain.     [provider]  pravastatin (PRAVACHOL) 20 MG tablet Take 1 tablet (20 mg total) by mouth daily. 09/19/11   Bonnielee Haff, MD  Rivaroxaban (XARELTO) 20 MG TABS Take 20 mg by mouth daily.    [provider]  spironolactone (ALDACTONE) 25 MG tablet Take 25 mg by mouth daily.    [provider]    Family History Family History  Problem Relation Age of Onset  . Diabetes type II Mother   . Asthma Mother   . Diabetes type II Sister   . Asthma Sister   . Asthma Brother   . Asthma Son     Social History Social History  Substance Use Topics  . Smoking status: Current Every Day Smoker    Packs/day: 0.50    Years: 40.00    Types: Cigarettes  . Smokeless tobacco: Never Used  . Alcohol use No     Allergies   Banana and Kiwi extract   Review of Systems Review of Systems  Constitutional: Negative for fever.  Respiratory: Positive for cough and shortness of breath.   Cardiovascular: Positive for chest pain. Negative for leg swelling.  Gastrointestinal: Negative for abdominal pain.  All other systems reviewed and are negative.    Physical Exam Updated Vital Signs BP 118/72 (BP Location: Right Arm)   Pulse 83   Temp 98.1 F (36.7 C) (Oral)   Resp 16   Ht 6' (1.829 m)   Wt 135.2 kg (298 lb)   SpO2 97%   BMI 40.42 kg/m   Physical Exam  Constitutional: He is oriented to person, place, and time. He appears well-developed and well-nourished. He appears distressed.  obese  HENT:  Head: Normocephalic and atraumatic.  Right Ear: External ear normal.  Left Ear: External ear normal.  Nose: Nose normal.  Eyes: Right eye  exhibits no discharge. Left eye exhibits no discharge.  Neck: Neck supple.  Cardiovascular: Normal rate, regular rhythm and normal heart sounds.   Pulmonary/Chest: Effort normal and breath sounds normal. Tachypnea noted. He has no wheezes. He exhibits no tenderness.  Abdominal: Soft. There is no tenderness.  Musculoskeletal: He exhibits no edema.  Neurological: He is alert and oriented to person, place, and time.  Skin: Skin is warm and dry. He is not diaphoretic.  Nursing note and vitals reviewed.    ED Treatments / Results  Labs (all labs ordered are listed, but only abnormal results are displayed) Labs Reviewed  CBC WITH DIFFERENTIAL/PLATELET - Abnormal;  Notable for the following:       Result Value   WBC 12.4 (*)    Hemoglobin 12.0 (*)    MCH 24.8 (*)    RDW 15.9 (*)    Neutro Abs 8.7 (*)    Monocytes Absolute 1.4 (*)    All other components within normal limits  COMPREHENSIVE METABOLIC PANEL - Abnormal; Notable for the following:    Glucose, Bld 150 (*)    Creatinine, Ser 1.43 (*)    Albumin 3.4 (*)    AST 147 (*)    Total Bilirubin 1.4 (*)    GFR calc non Af Amer 52 (*)    All other components within normal limits  BRAIN NATRIURETIC PEPTIDE - Abnormal; Notable for the following:    B Natriuretic Peptide 1,406.7 (*)    All other components within normal limits  TROPONIN I - Abnormal; Notable for the following:    Troponin I 36.57 (*)    All other components within normal limits  PROTIME-INR  APTT  LIPID PANEL    EKG  EKG Interpretation  Date/Time:  Thursday July 06 2017 21:23:43 EDT Ventricular Rate:  100 PR Interval:    QRS Duration: 107 QT Interval:  358 QTC Calculation: 448 R Axis:   47 Text Interpretation:  Sinus tachycardia Ventricular bigeminy Inferior infarct, acute (RCA) Probable RV involvement, suggest recording right precordial leads ** ** ACUTE MI / STEMI ** ** Confirmed by Sherwood Gambler [phone]) on 07/06/2017 9:55:51 PM        Radiology Dg Chest 2 View  Result Date: 07/06/2017 CLINICAL DATA:  Shortness of breath and nonproductive cough for 1 month. EXAM: CHEST  2 VIEW COMPARISON:  09/14/2011 and prior chest radiographs FINDINGS: Cardiomegaly and prominent right hilum are unchanged from 2012. Mild peribronchial thickening again noted. Increased opacity within the posterior lower lobe on the lateral view may lie on the right side-question airspace disease/pneumonia. There is no evidence of pulmonary edema, suspicious pulmonary nodule/mass, pleural effusion, or pneumothorax. No acute bony abnormalities are identified. IMPRESSION: Increased opacity within the posterior lower lobe on the lateral view which may represent airspace disease/ pneumonia. Cardiomegaly. Electronically Signed   By: Margarette Canada M.D.   On: 07/06/2017 19:41    Procedures .Critical Care Performed by: Sherwood Gambler Authorized by: Sherwood Gambler   Critical care provider statement:    Critical care time (minutes):  30   Critical care time was exclusive of:  Separately billable procedures and treating other patients   Critical care was necessary to treat or prevent imminent or life-threatening deterioration of the following conditions:  Cardiac failure, circulatory failure and shock   Critical care was time spent personally by me on the following activities:  Development of treatment plan with patient or surrogate, discussions with consultants, examination of patient, obtaining history from patient or surrogate, ordering and performing treatments and interventions, ordering and review of laboratory studies, ordering and review of radiographic studies, pulse oximetry, re-evaluation of patient's condition and review of old charts    (including critical care time)  Medications Ordered in ED Medications  albuterol (PROVENTIL) (2.5 MG/3ML) 0.083% nebulizer solution (not administered)  0.9 %  sodium chloride infusion (not administered)  ticagrelor  (BRILINTA) tablet 180 mg (not administered)  albuterol (PROVENTIL) (2.5 MG/3ML) 0.083% nebulizer solution 5 mg (5 mg Nebulization Given 07/06/17 1853)  aspirin chewable tablet 324 mg (324 mg Oral Given 07/06/17 2138)  heparin injection 4,000 Units (4,000 Units Intravenous Given 07/06/17 2139)  Initial Impression / Assessment and Plan / ED Course  I have reviewed the triage vital signs and the nursing notes.  Pertinent labs & imaging results that were available during my care of the patient were reviewed by me and considered in my medical decision making (see chart for details).  Clinical Course as of Jul 07 2355  Thu Jul 06, 2017  2135 D/w Dr. Einar Gip, he is out of town, will have his partner called for the on call  [SG]  2142 D/w Dr Virgina Jock, asks for 180 brilinta now, will come to do emergent cath  [SG]    Clinical Course User Index [SG] Sherwood Gambler, MD    Patient's presentation is initially concerning for pneumonia with the cough and shortness of breath but the chest pain is also quite concerning. A troponin had been added onto his lab work started in triage and it came back at 67. ECG was first obtained when he arrived to the room, which was at 2116. Previously was waiting in waiting room. ECG obtained after arrival to room, concerning for inferior STEMI, although the interpretation is limited by PVCs. As above, I discussed with Dr. Einar Gip who is out of town and then his partner who came in and evaluated patient. Cath lab activated shortly after ECG reviewed. IV heparin, ASA given. While he appears dyspneic he is not in respiratory distress and airway is stable. To the cath lab.   Final Clinical Impressions(s) / ED Diagnoses   Final diagnoses:  ST elevation myocardial infarction (STEMI), unspecified artery [facility])    New Prescriptions New Prescriptions   No medications on file     Sherwood Gambler, MD 07/06/17 2356

## 2017-07-06 NOTE — ED Notes (Signed)
STEMI activated per Dr. Criss Alvine

## 2017-07-06 NOTE — ED Notes (Signed)
Pt alert and oriented x's 3,  Pt denies any chest pain at this time

## 2017-07-06 NOTE — Interval H&P Note (Signed)
History and Physical Interval Note:  07/06/2017 10:13 PM  Todd Sullivan  has presented today for surgery, with the diagnosis of STEMI  The various methods of treatment have been discussed with the patient and family. After consideration of risks, benefits and other options for treatment, the patient has consented to  Procedure(s): LEFT HEART CATH AND CORONARY ANGIOGRAPHY (N/A) as a surgical intervention .  The patient's history has been reviewed, patient examined, no change in status, stable for surgery.  I have reviewed the patient's chart and labs.  Questions were answered to the patient's satisfaction.    Cath Lab Visit (complete for each Cath Lab visit)  Clinical Evaluation Leading to the Procedure:   ACS: Yes.    Non-ACS:    Anginal Classification: CCS IV  Anti-ischemic medical therapy: Minimal Therapy (1 class of medications)  Non-Invasive Test Results: No non-invasive testing performed  Prior CABG: No previous CABG   Todd Sullivan Todd Sullivan Todd Sullivan Todd Sullivan

## 2017-07-06 NOTE — ED Notes (Signed)
Pt placed on zoll monitor

## 2017-07-07 ENCOUNTER — Other Ambulatory Visit: Payer: Self-pay

## 2017-07-07 ENCOUNTER — Encounter (HOSPITAL_COMMUNITY): Payer: Self-pay | Admitting: Cardiology

## 2017-07-07 ENCOUNTER — Inpatient Hospital Stay (HOSPITAL_COMMUNITY): Payer: Medicare Other

## 2017-07-07 LAB — BASIC METABOLIC PANEL
ANION GAP: 10 (ref 5–15)
Anion gap: 7 (ref 5–15)
BUN: 12 mg/dL (ref 6–20)
BUN: 14 mg/dL (ref 6–20)
CHLORIDE: 103 mmol/L (ref 101–111)
CHLORIDE: 100 mmol/L — AB (ref 101–111)
CO2: 23 mmol/L (ref 22–32)
CO2: 28 mmol/L (ref 22–32)
Calcium: 8.6 mg/dL — ABNORMAL LOW (ref 8.9–10.3)
Calcium: 8.7 mg/dL — ABNORMAL LOW (ref 8.9–10.3)
Creatinine, Ser: 1.32 mg/dL — ABNORMAL HIGH (ref 0.61–1.24)
Creatinine, Ser: 1.52 mg/dL — ABNORMAL HIGH (ref 0.61–1.24)
GFR calc Af Amer: >60 mL/min (ref >60)
GFR, EST AFRICAN AMERICAN: 56 mL/min — AB (ref >60)
GFR, EST NON AFRICAN AMERICAN: 57 mL/min — AB (ref >60)
GFR, EST NON AFRICAN AMERICAN: 48 mL/min — AB (ref >60)
Glucose, Bld: 118 mg/dL — ABNORMAL HIGH (ref 65–99)
Glucose, Bld: 103 mg/dL — ABNORMAL HIGH (ref 65–99)
POTASSIUM: 3.8 mmol/L (ref 3.5–5.1)
POTASSIUM: 3.7 mmol/L (ref 3.5–5.1)
SODIUM: 136 mmol/L (ref 135–145)
SODIUM: 135 mmol/L (ref 135–145)

## 2017-07-07 LAB — CBC
HEMATOCRIT: 38.6 % — AB (ref 39.0–52.0)
HEMOGLOBIN: 11.6 g/dL — AB (ref 13.0–17.0)
MCH: 24.5 pg — ABNORMAL LOW (ref 26.0–34.0)
MCHC: 30.1 g/dL (ref 30.0–36.0)
MCV: 81.6 fL (ref 78.0–100.0)
Platelets: 287 10*3/uL (ref 150–400)
RBC: 4.73 MIL/uL (ref 4.22–5.81)
RDW: 16 % — ABNORMAL HIGH (ref 11.5–15.5)
WBC: 14.4 10*3/uL — AB (ref 4.0–10.5)

## 2017-07-07 LAB — HEMOGLOBIN A1C
Hgb A1c MFr Bld: 6.5 % — ABNORMAL HIGH (ref 4.8–5.6)
MEAN PLASMA GLUCOSE: 139.85 mg/dL

## 2017-07-07 LAB — PROTIME-INR
INR: 1.3
PROTHROMBIN TIME: 16.1 s — AB (ref 11.4–15.2)

## 2017-07-07 LAB — POCT ACTIVATED CLOTTING TIME
Activated Clotting Time: 213 seconds
Activated Clotting Time: 219 seconds
Activated Clotting Time: 560 seconds

## 2017-07-07 LAB — APTT: aPTT: 196 seconds (ref 24–36)

## 2017-07-07 LAB — LIPID PANEL
CHOLESTEROL: 124 mg/dL (ref 0–200)
HDL: 36 mg/dL — AB (ref >40)
LDL Cholesterol: 76 mg/dL (ref 0–99)
TRIGLYCERIDES: 62 mg/dL (ref <150)
Total CHOL/HDL Ratio: 3.4 RATIO
VLDL: 12 mg/dL (ref 0–40)

## 2017-07-07 LAB — MAGNESIUM
MAGNESIUM: 2.9 mg/dL — AB (ref 1.7–2.4)
Magnesium: 1.6 mg/dL — ABNORMAL LOW (ref 1.7–2.4)

## 2017-07-07 LAB — ECHOCARDIOGRAM COMPLETE
HEIGHTINCHES: 72 in
Weight: 4719.61 oz

## 2017-07-07 LAB — GLUCOSE, CAPILLARY
GLUCOSE-CAPILLARY: 109 mg/dL — AB (ref 65–99)
GLUCOSE-CAPILLARY: 138 mg/dL — AB (ref 65–99)
GLUCOSE-CAPILLARY: 142 mg/dL — AB (ref 65–99)

## 2017-07-07 LAB — MRSA PCR SCREENING: MRSA BY PCR: NEGATIVE

## 2017-07-07 MED ORDER — ATORVASTATIN CALCIUM 80 MG PO TABS
80.0000 mg | ORAL_TABLET | Freq: Every day | ORAL | Status: DC
  Administered 2017-07-07 – 2017-07-08 (×2): 80 mg via ORAL
  Filled 2017-07-07 (×2): qty 1

## 2017-07-07 MED ORDER — FUROSEMIDE 10 MG/ML IJ SOLN
INTRAMUSCULAR | Status: AC
  Filled 2017-07-07: qty 4

## 2017-07-07 MED ORDER — ASPIRIN EC 81 MG PO TBEC
81.0000 mg | DELAYED_RELEASE_TABLET | Freq: Every day | ORAL | Status: DC
  Administered 2017-07-07 – 2017-07-09 (×3): 81 mg via ORAL
  Filled 2017-07-07 (×3): qty 1

## 2017-07-07 MED ORDER — FUROSEMIDE 40 MG PO TABS
40.0000 mg | ORAL_TABLET | Freq: Every day | ORAL | Status: DC
  Administered 2017-07-07 – 2017-07-08 (×2): 40 mg via ORAL
  Filled 2017-07-07 (×3): qty 1

## 2017-07-07 MED ORDER — CARVEDILOL 25 MG PO TABS
25.0000 mg | ORAL_TABLET | Freq: Two times a day (BID) | ORAL | Status: DC
  Administered 2017-07-07 – 2017-07-09 (×5): 25 mg via ORAL
  Filled 2017-07-07 (×5): qty 1

## 2017-07-07 MED ORDER — ORAL CARE MOUTH RINSE: 15.0000 mL | Freq: Two times a day (BID) | OROMUCOSAL | Status: DC

## 2017-07-07 MED ORDER — TICAGRELOR 90 MG PO TABS
90.0000 mg | ORAL_TABLET | Freq: Two times a day (BID) | ORAL | Status: DC
  Administered 2017-07-07 – 2017-07-09 (×5): 90 mg via ORAL
  Filled 2017-07-07 (×5): qty 1

## 2017-07-07 MED ORDER — SODIUM CHLORIDE 0.9% FLUSH
3.0000 mL | Freq: Two times a day (BID) | INTRAVENOUS | Status: DC
  Administered 2017-07-08 (×2): 3 mL via INTRAVENOUS

## 2017-07-07 MED ORDER — SODIUM CHLORIDE 0.9% FLUSH: 3.0000 mL | INTRAVENOUS | Status: DC | PRN

## 2017-07-07 MED ORDER — OXYCODONE-ACETAMINOPHEN 10-325 MG PO TABS: 1.0000 | ORAL_TABLET | ORAL | Status: DC | PRN

## 2017-07-07 MED ORDER — OXYCODONE-ACETAMINOPHEN 5-325 MG PO TABS: 1.0000 | ORAL_TABLET | ORAL | Status: DC | PRN

## 2017-07-07 MED ORDER — MAGNESIUM SULFATE 4 GM/100ML IV SOLN
4.0000 g | Freq: Once | INTRAVENOUS | Status: AC
  Administered 2017-07-07: 4 g via INTRAVENOUS
  Filled 2017-07-07: qty 100

## 2017-07-07 MED ORDER — SODIUM CHLORIDE 0.9 % IV SOLN
INTRAVENOUS | Status: DC
  Administered 2017-07-07: via INTRAVENOUS

## 2017-07-07 MED ORDER — LABETALOL HCL 5 MG/ML IV SOLN
10.0000 mg | INTRAVENOUS | Status: AC | PRN
Start: 2017-07-07 — End: 2017-07-07

## 2017-07-07 MED ORDER — ACETAMINOPHEN 325 MG PO TABS: 650.0000 mg | ORAL_TABLET | ORAL | Status: DC | PRN

## 2017-07-07 MED ORDER — LISINOPRIL 20 MG PO TABS
40.0000 mg | ORAL_TABLET | Freq: Every day | ORAL | Status: DC
Start: 2017-07-07 — End: 2017-07-09
  Administered 2017-07-07 – 2017-07-08 (×2): 40 mg via ORAL
  Filled 2017-07-07 (×3): qty 2

## 2017-07-07 MED ORDER — ONDANSETRON HCL 4 MG/2ML IJ SOLN: 4.0000 mg | Freq: Four times a day (QID) | INTRAMUSCULAR | Status: DC | PRN

## 2017-07-07 MED ORDER — SPIRONOLACTONE 25 MG PO TABS
25.0000 mg | ORAL_TABLET | Freq: Every day | ORAL | Status: DC
  Administered 2017-07-07 – 2017-07-08 (×3): 25 mg via ORAL
  Filled 2017-07-07 (×3): qty 1

## 2017-07-07 MED ORDER — ALBUTEROL SULFATE (2.5 MG/3ML) 0.083% IN NEBU
2.5000 mg | INHALATION_SOLUTION | RESPIRATORY_TRACT | Status: DC | PRN
  Administered 2017-07-07 (×2): 2.5 mg via RESPIRATORY_TRACT
  Filled 2017-07-07 (×2): qty 3

## 2017-07-07 MED ORDER — CLONIDINE HCL 0.1 MG PO TABS
0.1000 mg | ORAL_TABLET | Freq: Two times a day (BID) | ORAL | Status: DC
  Administered 2017-07-07 (×3): 0.1 mg via ORAL
  Filled 2017-07-07 (×3): qty 1

## 2017-07-07 MED ORDER — INSULIN ASPART 100 UNIT/ML ~~LOC~~ SOLN
6.0000 [IU] | Freq: Three times a day (TID) | SUBCUTANEOUS | Status: DC
  Administered 2017-07-07 – 2017-07-08 (×6): 6 [IU] via SUBCUTANEOUS

## 2017-07-07 MED ORDER — ISOSORB DINITRATE-HYDRALAZINE 20-37.5 MG PO TABS
1.0000 | ORAL_TABLET | Freq: Three times a day (TID) | ORAL | Status: DC
  Administered 2017-07-07 – 2017-07-09 (×6): 1 via ORAL
  Filled 2017-07-07 (×7): qty 1

## 2017-07-07 MED ORDER — INSULIN GLARGINE 100 UNIT/ML ~~LOC~~ SOLN
30.0000 [IU] | Freq: Every day | SUBCUTANEOUS | Status: DC
  Administered 2017-07-07 – 2017-07-09 (×2): 30 [IU] via SUBCUTANEOUS
  Filled 2017-07-07 (×4): qty 0.3

## 2017-07-07 MED ORDER — ORAL CARE MOUTH RINSE
15.0000 mL | Freq: Two times a day (BID) | OROMUCOSAL | Status: DC
  Administered 2017-07-07: 15 mL via OROMUCOSAL

## 2017-07-07 MED ORDER — CYCLOBENZAPRINE HCL 10 MG PO TABS
10.0000 mg | ORAL_TABLET | Freq: Two times a day (BID) | ORAL | Status: DC
  Administered 2017-07-07 – 2017-07-09 (×6): 10 mg via ORAL
  Filled 2017-07-07 (×6): qty 1

## 2017-07-07 MED ORDER — CHLORHEXIDINE GLUCONATE 0.12 % MT SOLN
15.0000 mL | Freq: Two times a day (BID) | OROMUCOSAL | Status: DC
  Administered 2017-07-07 – 2017-07-08 (×2): 15 mL via OROMUCOSAL
  Filled 2017-07-07 (×2): qty 15

## 2017-07-07 MED ORDER — FUROSEMIDE 40 MG PO TABS: 40.0000 mg | ORAL_TABLET | Freq: Every day | ORAL | Status: DC

## 2017-07-07 MED ORDER — POTASSIUM CHLORIDE CRYS ER 20 MEQ PO TBCR
40.0000 meq | EXTENDED_RELEASE_TABLET | Freq: Once | ORAL | Status: AC
  Administered 2017-07-07: 40 meq via ORAL
  Filled 2017-07-07: qty 2

## 2017-07-07 MED ORDER — OXYCODONE HCL 5 MG PO TABS: 5.0000 mg | ORAL_TABLET | ORAL | Status: DC | PRN

## 2017-07-07 MED ORDER — MAGNESIUM SULFATE 2 GM/50ML IV SOLN
2.0000 g | Freq: Once | INTRAVENOUS | Status: AC
  Administered 2017-07-07: 2 g via INTRAVENOUS
  Filled 2017-07-07: qty 50

## 2017-07-07 MED ORDER — NICOTINE 7 MG/24HR TD PT24
7.0000 mg | MEDICATED_PATCH | Freq: Every day | TRANSDERMAL | Status: DC
  Administered 2017-07-07 – 2017-07-09 (×3): 7 mg via TRANSDERMAL
  Filled 2017-07-07 (×4): qty 1

## 2017-07-07 MED ORDER — CHLORHEXIDINE GLUCONATE 0.12 % MT SOLN
15.0000 mL | Freq: Two times a day (BID) | OROMUCOSAL | Status: DC
  Administered 2017-07-08: 15 mL via OROMUCOSAL
  Filled 2017-07-07 (×2): qty 15

## 2017-07-07 MED ORDER — FUROSEMIDE 10 MG/ML IJ SOLN
40.0000 mg | Freq: Once | INTRAMUSCULAR | Status: AC
  Administered 2017-07-07: 40 mg via INTRAVENOUS

## 2017-07-07 MED ORDER — NITROGLYCERIN 2 % TD OINT
1.0000 [in_us] | TOPICAL_OINTMENT | Freq: Four times a day (QID) | TRANSDERMAL | Status: DC
  Administered 2017-07-07 (×3): 1 [in_us] via TOPICAL
  Filled 2017-07-07: qty 30

## 2017-07-07 MED ORDER — MOMETASONE FURO-FORMOTEROL FUM 200-5 MCG/ACT IN AERO
2.0000 | INHALATION_SPRAY | Freq: Two times a day (BID) | RESPIRATORY_TRACT | Status: DC
  Administered 2017-07-07 – 2017-07-09 (×5): 2 via RESPIRATORY_TRACT
  Filled 2017-07-07 (×2): qty 8.8

## 2017-07-07 MED ORDER — FUROSEMIDE 10 MG/ML IJ SOLN
40.0000 mg | Freq: Once | INTRAMUSCULAR | Status: AC
  Administered 2017-07-07: 40 mg via INTRAVENOUS
  Filled 2017-07-07: qty 4

## 2017-07-07 MED ORDER — INSULIN ASPART 100 UNIT/ML ~~LOC~~ SOLN
0.0000 [IU] | Freq: Three times a day (TID) | SUBCUTANEOUS | Status: DC
  Administered 2017-07-07 – 2017-07-08 (×3): 3 [IU] via SUBCUTANEOUS

## 2017-07-07 MED ORDER — COLCHICINE 0.6 MG PO TABS
0.6000 mg | ORAL_TABLET | Freq: Every day | ORAL | Status: DC
  Administered 2017-07-07 – 2017-07-08 (×3): 0.6 mg via ORAL
  Filled 2017-07-07 (×3): qty 1

## 2017-07-07 MED ORDER — SODIUM CHLORIDE 0.9 % IV SOLN: 250.0000 mL | INTRAVENOUS | Status: DC | PRN

## 2017-07-07 MED ORDER — POTASSIUM CHLORIDE CRYS ER 20 MEQ PO TBCR
20.0000 meq | EXTENDED_RELEASE_TABLET | Freq: Once | ORAL | Status: AC
  Administered 2017-07-07: 20 meq via ORAL
  Filled 2017-07-07: qty 1

## 2017-07-07 MED FILL — Lidocaine HCl Local Inj 2%: INTRAMUSCULAR | Qty: 10 | Status: AC

## 2017-07-07 MED FILL — Nitroglycerin IV Soln 100 MCG/ML in D5W: INTRA_ARTERIAL | Qty: 10 | Status: AC

## 2017-07-07 NOTE — Progress Notes (Signed)
Per insurance check for Brilinta   Pt copay will be $3.70. Prior auth not required

## 2017-07-07 NOTE — Progress Notes (Signed)
Patient continues to have increased work of breathing; MD notified; started on bipap; CXR modified to stat;  Will continue to monitor.  Ivery Quale RN

## 2017-07-07 NOTE — Care Management Note (Addendum)
Case Management Note Donn Pierini RN, BSN Unit 4E-Case Manager-- 2H coverage [phone]  Patient Details  Name: Datron Lopiano MRN: [id number] Date of Birth: [birth year]  Subjective/Objective:   Pt admitted with STEMI-  S/p primary PCI LCx PDA Synergy DES                  Action/Plan: PTA pt lived at home- noted pt has been started on Brilinta- insurance check submitted- CM will f/u once benefit check is completed  Expected Discharge Date:                  Expected Discharge Plan:  Home/Self Care  In-House Referral:     Discharge planning Services  CM Consult, Medication Assistance  Post Acute Care Choice:    Choice offered to:     DME Arranged:    DME Agency:     HH Arranged:    HH Agency:     Status of Service:  In process, will continue to follow  If discussed at Long Length of Stay Meetings, dates discussed:    Discharge Disposition:   Additional Comments:  07/07/17- 1415- Donn Pierini RN, CM- per insurance check pt's copay $3.70- spoke with pt and son at bedside- per pt he has both medicare/medicaid- uses [facility] on [address].  Pt provided 30 day free card to use on discharge- explained use to both pt and son along with insurance coverage.   Darrold Span, RN 07/07/2017, 11:30 AM

## 2017-07-07 NOTE — Progress Notes (Signed)
   07/07/17 1500  Clinical Encounter Type  Visited With Patient and family together  Visit Type Follow-up  Referral From Nurse  Consult/Referral To Chaplain  Spiritual Encounters  Spiritual Needs Literature  Stress Factors  Patient Stress Factors Exhausted  Family Stress Factors Health changes  Advance Directives (For Healthcare)  Does Patient Have a Medical Advance Directive? Yes  Completed consult in the system for patient that wanted to have an advanced directive signed.  Spoke with the patient about its indications and meanings.  Dialogued with the son concerning when the HCPOA becomes effective.  No follow up needed

## 2017-07-07 NOTE — Plan of Care (Signed)
Problem: Physical Regulation: Goal: Ability to maintain clinical measurements within normal limits will improve Outcome: Not Progressing SOB, dyspnea; on BIPAP and lasix pushes

## 2017-07-07 NOTE — Progress Notes (Signed)
  Echocardiogram 2D Echocardiogram has been performed.  Todd Sullivan 07/07/2017, 11:50 AM

## 2017-07-07 NOTE — Progress Notes (Addendum)
eLink Physician-Brief Progress Note Patient Name: Todd Sullivan DOB: 12/19/1955 MRN: [id number]   Date of Service  07/07/2017  HPI/Events of Note  IW STEMI - resp distress p-cath Adm pCXR s/o RLL ASD  eICU Interventions  pCXR now s/o interstitial edema Lasix >> 2 L diuresed BiPAP , dc IVFs Low threshold to add Abx if fever /sputum     Intervention Category Evaluation Type: New Patient Evaluation  Gordon Vandunk V. 07/07/2017, 3:13 AM

## 2017-07-07 NOTE — H&P (Addendum)
Todd Sullivan is an 61 y.o. male.   Chief Complaint: Chest pain, shortness of breath HPI:   Todd Sullivan is a 61 year old African-American male with hypertension, hyperlipidemia, type 2 diabetes mellitus, morbid obesity, diastolic heart failure who presented to the ED with 3 days of off-and-on chest pain or shortness of breath. On arrival to the ED he was in respiratory distress with 1 out of 10 chest pain. He was human dynamically stable. EKG showed inferior ST elevations and anterior ST depressions. Trop was elevated at 30 ng/mL. We were notified at 21:37 hrs. Patient was diagnosed with inferior STEMI and emergently taken to cath lab at 22:07 hrs.  Patient underwent successful primary PCI to dominant left circumflex and PDA with synergy drug-eluting stent 2.5 x 16 mm. Of note, patient's LVEDP was elevated at 31 mmHg.   Past Medical History:  Diagnosis Date  . Anxiety   . Asthma   . CHF (congestive heart failure) ([place])   . COPD (chronic obstructive pulmonary disease) ([place])   . Depression   . Diabetes mellitus   . Gout   . Headache(784.0)   . Hypertension   . Shortness of breath   . Stroke [facility])     Past Surgical History:  Procedure Laterality Date  . EYE SURGERY  61 years old    Family History  Problem Relation Age of Onset  . Diabetes type II Mother   . Asthma Mother   . Diabetes type II Sister   . Asthma Sister   . Asthma Brother   . Asthma Son    Social History:  reports that he has been smoking Cigarettes.  He has a 20.00 pack-year smoking history. He has never used smokeless tobacco. He reports that he does not drink alcohol or use drugs.  Allergies:  Allergies  Allergen Reactions  . Banana     Sick   . Kiwi Extract Nausea And Vomiting    Medications Prior to Admission  Medication Sig Dispense Refill  . albuterol (PROVENTIL HFA;VENTOLIN HFA) 108 (90 BASE) MCG/ACT inhaler Inhale 2 puffs into the lungs every 4 (four) hours as needed. For shortness of breath.  1 Inhaler 3  . aspirin EC 81 MG tablet Take 81 mg by mouth daily.    . budesonide-formoterol (SYMBICORT) 160-4.5 MCG/ACT inhaler Inhale 2 puffs into the lungs 2 (two) times daily. 1 Inhaler 3  . carvedilol (COREG) 25 MG tablet Take 1 tablet (25 mg total) by mouth 2 (two) times daily with a meal. 60 tablet 4  . cloNIDine (CATAPRES) 0.1 MG tablet Take 0.1 mg by mouth 2 (two) times daily.    . colchicine 0.6 MG tablet Take 0.6 mg by mouth at bedtime.   0  . cyclobenzaprine (FLEXERIL) 10 MG tablet Take 10 mg by mouth 2 (two) times daily.    Marland Kitchen diltiazem (TIAZAC) 180 MG 24 hr capsule Take 360 mg by mouth at bedtime.    Marland Kitchen lisinopril (PRINIVIL,ZESTRIL) 40 MG tablet Take 1 tablet (40 mg total) by mouth daily. RESUME ON Feb 26, 2013. 30 tablet 2  . metFORMIN (GLUCOPHAGE) 500 MG tablet Take 500 mg by mouth 2 (two) times daily with a meal.    . oxyCODONE-acetaminophen (PERCOCET) 10-325 MG per tablet Take 1 tablet by mouth every 4 (four) hours as needed for pain.     . pravastatin (PRAVACHOL) 20 MG tablet Take 1 tablet (20 mg total) by mouth daily. (Patient taking differently: Take 20 mg by mouth at bedtime. ) 30 tablet  2  . spironolactone (ALDACTONE) 25 MG tablet Take 25 mg by mouth at bedtime.     . insulin aspart (NOVOLOG FLEXPEN) 100 unit/mL SOLN FlexPen Inject 6 Units into the skin 3 (three) times daily with meals. (Patient not taking: Reported on 07/06/2017) 1 pen 12  . insulin aspart (NOVOLOG) 100 UNIT/ML injection Inject 0-20 Units into the skin 3 (three) times daily with meals. 0-20 Units, Subcutaneous, 3 times daily with meals CBG < 70: call MD-and take high sugar content food right away. CBG 70 - 120: 0 units CBG 121 - 150: 3 units CBG 151 - 200: 4 units CBG 201 - 250: 7 units CBG 251 - 300: 11 units CBG 301 - 350: 15 units CBG 351 - 400: 20 units CBG > 400: call MD (Patient not taking: Reported on 07/06/2017) 1 vial   . Insulin Glargine (LANTUS SOLOSTAR) 100 UNIT/ML SOPN Inject 30 Units into the  skin at bedtime. (Patient not taking: Reported on 07/06/2017) 1 pen 12  . Insulin Pen Starter Kit MISC 1 kit by Other route once. (Patient not taking: Reported on 07/06/2017)      Results for orders placed or performed during the hospital encounter of 07/06/17 (from the past 48 hour(s))  CBC with Differential     Status: Abnormal   Collection Time: 07/06/17  6:12 PM  Result Value Ref Range   WBC 12.4 (H) 4.0 - 10.5 K/uL   RBC 4.84 4.22 - 5.81 MIL/uL   Hemoglobin 12.0 (L) 13.0 - 17.0 g/dL   HCT 39.1 39.0 - 52.0 %   MCV 80.8 78.0 - 100.0 fL   MCH 24.8 (L) 26.0 - 34.0 pg   MCHC 30.7 30.0 - 36.0 g/dL   RDW 15.9 (H) 11.5 - 15.5 %   Platelets 289 150 - 400 K/uL   Neutrophils Relative % 70 %   Neutro Abs 8.7 (H) 1.7 - 7.7 K/uL   Lymphocytes Relative 19 %   Lymphs Abs 2.3 0.7 - 4.0 K/uL   Monocytes Relative 11 %   Monocytes Absolute 1.4 (H) 0.1 - 1.0 K/uL   Eosinophils Relative 0 %   Eosinophils Absolute 0.0 0.0 - 0.7 K/uL   Basophils Relative 0 %   Basophils Absolute 0.0 0.0 - 0.1 K/uL  Comprehensive metabolic panel     Status: Abnormal   Collection Time: 07/06/17  6:12 PM  Result Value Ref Range   Sodium 136 135 - 145 mmol/L   Potassium 4.0 3.5 - 5.1 mmol/L   Chloride 105 101 - 111 mmol/L   CO2 23 22 - 32 mmol/L   Glucose, Bld 150 (H) 65 - 99 mg/dL   BUN 14 6 - 20 mg/dL   Creatinine, Ser 1.43 (H) 0.61 - 1.24 mg/dL   Calcium 9.0 8.9 - 10.3 mg/dL   Total Protein 6.6 6.5 - 8.1 g/dL   Albumin 3.4 (L) 3.5 - 5.0 g/dL   AST 147 (H) 15 - 41 U/L   ALT 48 17 - 63 U/L   Alkaline Phosphatase 61 38 - 126 U/L   Total Bilirubin 1.4 (H) 0.3 - 1.2 mg/dL   GFR calc non Af Amer 52 (L) >60 mL/min   GFR calc Af Amer >60 >60 mL/min    Comment: (NOTE) The eGFR has been calculated using the CKD EPI equation. This calculation has not been validated in all clinical situations. eGFR's persistently <60 mL/min signify possible Chronic Kidney Disease.    Anion gap 8 5 - 15  Brain  natriuretic peptide      Status: Abnormal   Collection Time: 07/06/17  6:50 PM  Result Value Ref Range   B Natriuretic Peptide 1,406.7 (H) 0.0 - 100.0 pg/mL  Troponin I     Status: Abnormal   Collection Time: 07/06/17  8:29 PM  Result Value Ref Range   Troponin I 36.57 (HH) <0.03 ng/mL    Comment: CRITICAL RESULT CALLED TO, READ BACK BY AND VERIFIED WITH: FIELDS K,RN 07/06/17 2107 WAYK    Dg Chest 2 View  Result Date: 07/06/2017 CLINICAL DATA:  Shortness of breath and nonproductive cough for 1 month. EXAM: CHEST  2 VIEW COMPARISON:  09/14/2011 and prior chest radiographs FINDINGS: Cardiomegaly and prominent right hilum are unchanged from 2012. Mild peribronchial thickening again noted. Increased opacity within the posterior lower lobe on the lateral view may lie on the right side-question airspace disease/pneumonia. There is no evidence of pulmonary edema, suspicious pulmonary nodule/mass, pleural effusion, or pneumothorax. No acute bony abnormalities are identified. IMPRESSION: Increased opacity within the posterior lower lobe on the lateral view which may represent airspace disease/ pneumonia. Cardiomegaly. Electronically Signed   By: Margarette Canada M.D.   On: 07/06/2017 19:41    Review of Systems  Constitutional: Positive for malaise/fatigue.  HENT: Negative.   Eyes: Negative.   Respiratory: Positive for shortness of breath. Negative for wheezing.   Cardiovascular: Positive for chest pain and leg swelling.  Gastrointestinal: Negative for abdominal pain and nausea.  Genitourinary: Negative.   Musculoskeletal: Negative.   Skin: Negative.   Neurological: Negative for dizziness and loss of consciousness.  Endo/Heme/Allergies: Negative.   Psychiatric/Behavioral: The patient is nervous/anxious.   All other systems reviewed and are negative.   Blood pressure (!) 165/105, pulse (!) 120, temperature 98.1 F (36.7 C), temperature source Oral, resp. rate (!) 0, height 6' (1.829 m), weight 135.2 kg (298 lb), SpO2 (!)  0 %. Physical Exam  Nursing note and vitals reviewed. Constitutional: He is oriented to person, place, and time. He appears well-developed.  Morbidly obese  HENT:  Head: Normocephalic and atraumatic.  Neck: JVD present.  Cardiovascular: Normal rate and regular rhythm.   No murmur heard. Respiratory: Effort normal and breath sounds normal.  Diminished at bases   GI: Soft. He exhibits distension.  Musculoskeletal: He exhibits edema.  Neurological: He is alert and oriented to person, place, and time.  Skin: Skin is warm and dry.     Assessment: STEMI Hypertension Type 2 DM Hyperlipidemia Morbid obesity Tobacco abuse CKD 2  Plan: S/p primary PCI LCx PDA Synergy DES 2.5 X 16 mm Echocardiogram Coreg, spironolactone, clonidine for hypertension IV hydration and diuresis to maintain urine output and prevent AKI/CIN Switch pravastatin to lipitor 80 mg Check A1C, lipid panel Insulin coverage Nicotine patch Cardiac rehab referral  I have discussed the findings with patient and his wife.   Nigel Mormon, MD 07/07/2017, 12:02 AM   Nigel Mormon, MD [facility] Pager: [phone] Office: [phone] If no answer Cell [phone]

## 2017-07-07 NOTE — Progress Notes (Addendum)
Subjective:  Overnight breathing difficult.  No chest pain No productive cough No fever, chills. No other complaints.  Objective:  Vital Signs in the last 24 hours: Temp:  [98.1 F (36.7 C)-98.2 F (36.8 C)] 98.2 F (36.8 C) (09/28 0000) Pulse Rate:  [0-120] 81 (09/28 0730) Resp:  [0-41] 26 (09/28 0730) BP: (118-168)/(72-127) 160/98 (09/28 0730) SpO2:  [0 %-100 %] 100 % (09/28 0730) Weight:  [133.8 kg (294 lb 15.6 oz)-135.2 kg (298 lb)] 133.8 kg (294 lb 15.6 oz) (09/28 0000)  Intake/Output from previous day: 09/27 0701 - 09/28 0700 In: 67.5 [I.V.:67.5] Out: 4050 [Urine:4050] Intake/Output from this shift: No intake/output data recorded.  Physical Exam: Physical Exam  Nursing note and vitals reviewed. Constitutional: He is oriented to person, place, and time. He appears well-developed.  Morbidly obese  HENT:  Head: Normocephalic and atraumatic.  Neck: JVD not apprecited.  Cardiovascular: Normal rate and regular rhythm.   No murmur heard. Respiratory: Effort normal and breath sounds normal.  Mild increased work of breathing. RLL wheezeing. Diminished LLL  GI: Soft. He exhibits distension.  Musculoskeletal: He exhibits no edema.  Neurological: He is alert and oriented to person, place, and time.  Skin: Skin is warm and dry.      Lab Results:  Recent Labs  07/06/17 1812 07/07/17 0411  WBC 12.4* 14.4*  HGB 12.0* 11.6*  PLT 289 287    Recent Labs  07/06/17 1812 07/07/17 0411  NA 136 136  K 4.0 3.8  CL 105 103  CO2 23 23  GLUCOSE 150* 118*  BUN 14 12  CREATININE 1.43* 1.32*    Recent Labs  07/06/17 2029  TROPONINI 36.57*   Hepatic Function Panel  Recent Labs  07/06/17 1812  PROT 6.6  ALBUMIN 3.4*  AST 147*  ALT 48  ALKPHOS 61  BILITOT 1.4*    Recent Labs  07/07/17 0032  CHOL 124   No results for input(s): PROTIME in the last 72 hours.  Imaging: EXAM: PORTABLE CHEST 1 VIEW  COMPARISON:  07/06/2017 chest  radiograph  FINDINGS: Stable cardiomegaly given projection and technique. Prominent central pulmonary arteries may represent pulmonary artery hypertension. Pulmonary vascular congestion. No focal consolidation. Transcutaneous pacing pads noted. No pleural effusion or pneumothorax.  IMPRESSION: 1. Stable cardiomegaly and pulmonary vascular congestion. No focal consolidation. 2. Prominent central pulmonary arteries may represent pulmonary artery hypertension.   Electronically Signed   By: Mitzi Hansen M.D.   On: 07/07/2017 03:00  Cardiac Studies: Echocardiogram pending  Procedures   Coronary/Graft Acute MI Revascularization  LEFT HEART CATH AND CORONARY ANGIOGRAPHY  Conclusion   Conclusion: Significant one vessel coronary artery disease Successful PTCA and stent placement  2.5 X 16 mm Synergy drug-eluting stent LCx PDA Elevated LVEDP      Assessment/Plan:   Assessment: STEMI: 07/06/2017 Respiratory distress, now improved Mild leukocytosis, No other infectious signs at this time Hypertension: Uncontrolled Type 2 DM: well controlled Hyperlipidemia Morbid obesity Tobacco abuse CKD 2: No signs os AKI yet.  Remote h/o PE. Not on anticoagulation now.  Plan: S/p primary PCI LCx PDA Synergy DES 2.5 X 16 mm Echocardiogram pending Coreg 25 mg bid, spironolactone 25 mg daily, clonidine 0.1 mg bid for hypertension NTG patch for preload and blood pressure reduction. Add Bidil 20-37.5 tid.  Lasix 40 mg IV X 2 overnight. 40 mg PO today. Check Mg. 2 g IV Continue lipitor 80 mg Check A1C, lipid panel Insulin coverage Nicotine patch Cardiac rehab referral   LOS: 1 day  Shayonna Ocampo J Ilanna Deihl 07/07/2017, 7:47 AM  Amanada Philbrick Emiliano Dyer, MD [facility] Pager: [phone] Office: [phone] If no answer Cell [phone]

## 2017-07-07 NOTE — Progress Notes (Signed)
Patient having labored breathing: RR30-40; rhonchus lung sounds bilaterally;  NS going at 70mL/hr; SOB does not improve with albuterol treatment;  O2 sat 92-98 on 3L [place];  MD notified; MD verbal order given to stop NS; give another 40mg  Lasix (80mg  total dose); nitro past 1 inch q6h; AM CXR; give dulera Now;  Will continue to monitor.  Ivery Quale RN

## 2017-07-08 LAB — BASIC METABOLIC PANEL
Anion gap: 10 (ref 5–15)
BUN: 18 mg/dL (ref 6–20)
CALCIUM: 8.7 mg/dL — AB (ref 8.9–10.3)
CO2: 26 mmol/L (ref 22–32)
Chloride: 100 mmol/L — ABNORMAL LOW (ref 101–111)
Creatinine, Ser: 1.57 mg/dL — ABNORMAL HIGH (ref 0.61–1.24)
GFR calc Af Amer: 54 mL/min — ABNORMAL LOW (ref >60)
GFR, EST NON AFRICAN AMERICAN: 46 mL/min — AB (ref >60)
GLUCOSE: 120 mg/dL — AB (ref 65–99)
POTASSIUM: 3.7 mmol/L (ref 3.5–5.1)
Sodium: 136 mmol/L (ref 135–145)

## 2017-07-08 LAB — GLUCOSE, CAPILLARY
GLUCOSE-CAPILLARY: 134 mg/dL — AB (ref 65–99)
GLUCOSE-CAPILLARY: 136 mg/dL — AB (ref 65–99)
GLUCOSE-CAPILLARY: 148 mg/dL — AB (ref 65–99)
Glucose-Capillary: 112 mg/dL — ABNORMAL HIGH (ref 65–99)
Glucose-Capillary: 100 mg/dL — ABNORMAL HIGH (ref 65–99)
Glucose-Capillary: 100 mg/dL — ABNORMAL HIGH (ref 65–99)

## 2017-07-08 LAB — CBC
HCT: 39 % (ref 39.0–52.0)
HEMOGLOBIN: 12.4 g/dL — AB (ref 13.0–17.0)
MCH: 25.4 pg — ABNORMAL LOW (ref 26.0–34.0)
MCHC: 31.8 g/dL (ref 30.0–36.0)
MCV: 79.8 fL (ref 78.0–100.0)
Platelets: 299 10*3/uL (ref 150–400)
RBC: 4.89 MIL/uL (ref 4.22–5.81)
RDW: 15.3 % (ref 11.5–15.5)
WBC: 12.4 10*3/uL — AB (ref 4.0–10.5)

## 2017-07-08 MED ORDER — POTASSIUM CHLORIDE CRYS ER 20 MEQ PO TBCR
40.0000 meq | EXTENDED_RELEASE_TABLET | Freq: Once | ORAL | Status: AC
  Administered 2017-07-08: 40 meq via ORAL
  Filled 2017-07-08: qty 2

## 2017-07-08 MED ORDER — CLONIDINE HCL 0.1 MG PO TABS
0.1000 mg | ORAL_TABLET | Freq: Every day | ORAL | Status: DC
  Administered 2017-07-08: 0.1 mg via ORAL
  Filled 2017-07-08 (×2): qty 1

## 2017-07-08 NOTE — Progress Notes (Signed)
[id number] Came to educate pt and he was moved to [facility] during this time. Pt walked earlier with RN and needed 3L to keep sats up. Will need to walk with staff again to see if he needs home oxygen. MI and CHF ed done with pt who voiced understanding. Encouraged pt to read materials as he had 5 grandchildren in room and may have been hard to concentrate. Pt stated he has dealt with CHF for a long time. Discussed daily weights, 2000 mg sodium restriction. Reviewed NTG use, importance of brilinta with stent, and ex ed.. He has brilinta card. Gave diabetic and low sodium and heart healthy diets. Discussed CRP 2 and will refer to [place]. Pt stated he will read over all materials, tired now from company and move. Luetta Nutting RN BSN 07/08/2017 3:05 PM

## 2017-07-08 NOTE — Progress Notes (Addendum)
Subjective:  Feels much better today. Breathing has improved No chest pain No productive cough No fever, chills. No other complaints.  Objective:  Vital Signs in the last 24 hours: Temp:  [97.4 F (36.3 C)-98.6 F (37 C)] 97.7 F (36.5 C) (09/29 0715) Pulse Rate:  [64-82] 70 (09/29 0700) Resp:  [16-35] 25 (09/29 0700) BP: (90-136)/(53-97) 128/89 (09/29 0700) SpO2:  [93 %-100 %] 100 % (09/29 0808)  Intake/Output from previous day: 09/28 0701 - 09/29 0700 In: 1510 [P.O.:1360; IV Piggyback:150] Out: 2075 [Urine:2075] Intake/Output from this shift: Total I/O In: 180 [P.O.:180] Out: -   Physical Exam: Physical Exam  Nursing note and vitals reviewed. Constitutional: He is oriented to person, place, and time. He appears well-developed.  Morbidly obese  HENT:  Head: Normocephalic and atraumatic.  Neck: JVD not apprecited.  Cardiovascular: Normal rate and regular rhythm.   No murmur heard. Respiratory: Effort normal and breath sounds normal.  B.l improved air entry GI: Soft. He exhibits no distension.  Musculoskeletal: He exhibits no edema.  Neurological: He is alert and oriented to person, place, and time.  Skin: Skin is warm and dry.      Lab Results:  Recent Labs  07/07/17 0411 07/08/17 0416  WBC 14.4* 12.4*  HGB 11.6* 12.4*  PLT 287 299    Recent Labs  07/07/17 1617 07/08/17 0416  NA 135 136  K 3.7 3.7  CL 100* 100*  CO2 28 26  GLUCOSE 103* 120*  BUN 14 18  CREATININE 1.52* 1.57*    Recent Labs  07/06/17 2029  TROPONINI 36.57*   Hepatic Function Panel  Recent Labs  07/06/17 1812  PROT 6.6  ALBUMIN 3.4*  AST 147*  ALT 48  ALKPHOS 61  BILITOT 1.4*    Recent Labs  07/07/17 0032  CHOL 124   No results for input(s): PROTIME in the last 72 hours.  Imaging: EXAM: PORTABLE CHEST 1 VIEW  COMPARISON:  07/06/2017 chest radiograph  FINDINGS: Stable cardiomegaly given projection and technique. Prominent central pulmonary arteries  may represent pulmonary artery hypertension. Pulmonary vascular congestion. No focal consolidation. Transcutaneous pacing pads noted. No pleural effusion or pneumothorax.  IMPRESSION: 1. Stable cardiomegaly and pulmonary vascular congestion. No focal consolidation. 2. Prominent central pulmonary arteries may represent pulmonary artery hypertension.   Electronically Signed   By: Mitzi Hansen M.D.   On: 07/07/2017 03:00  Cardiac Studies: Echocardiogram pending  Procedures   Coronary/Graft Acute MI Revascularization  LEFT HEART CATH AND CORONARY ANGIOGRAPHY  Conclusion   Conclusion: Significant one vessel coronary artery disease Successful PTCA and stent placement  2.5 X 16 mm Synergy drug-eluting stent LCx PDA Elevated LVEDP      Assessment/Plan:   Assessment: STEMI: 07/06/2017 Respiratory distress, now improved Acute on chronic systolic and diastolic heart failure. EF 30-35% on echocardiogram 07/07/2017. 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 Cr 1.57, about his baseline Remote h/o PE. Not on anticoagulation now.  Plan: S/p primary PCI LCx PDA Synergy DES 2.5 X 16 mm ASA 81 mg lifelong, brilinta 90 mg bid for at least 1 year Coreg 25 mg bid, spironolactone 25 mg daily. Added Bidil 20-37.5 mg tid. Decrease clonidine to 0.1 daily today. Intend to wean him off clonidine outpatient Continue lasix 40 mg PO daily. Klor 40 mEq Continue lipitor 80 mg Insulin coverage Nicotine patch  Cardiac rehab referral  Transfer to telemetry today. Expected discharge date 07/09/2017   LOS: 2 days    Manish J Patwardhan 07/08/2017, 9:46 AM  Elder Negus, MD [facility] Pager: [phone] Office: [phone] If  no answer Cell [phone]

## 2017-07-08 NOTE — Progress Notes (Deleted)
Patient resting comfortably on room air. BIPAP is not needed at this time. RT will monitor as needed. 

## 2017-07-08 NOTE — Progress Notes (Signed)
Patient in chair on Oxygen via n/c at 3 lpm.  O2 sats = 100%  Patient ambulated 150 ft. With FWRW and O2 at 3lpm.  O2 sats = 100%  Patient ambulated 100 ft. On room air.  O2 sats = 80 - 86%.  Slightly SOB.  Oxygen back on at 3 lpm and oxygen level immediately returned to 100%.

## 2017-07-08 NOTE — Progress Notes (Signed)
RT placed patient on BIPAP. Patient tolerating well at this time. RT will monitor as needed. 

## 2017-07-09 LAB — CBC
HCT: 39 % (ref 39.0–52.0)
Hemoglobin: 12.2 g/dL — ABNORMAL LOW (ref 13.0–17.0)
MCH: 25.2 pg — ABNORMAL LOW (ref 26.0–34.0)
MCHC: 31.3 g/dL (ref 30.0–36.0)
MCV: 80.6 fL (ref 78.0–100.0)
Platelets: 340 10*3/uL (ref 150–400)
RBC: 4.84 MIL/uL (ref 4.22–5.81)
RDW: 15.7 % — ABNORMAL HIGH (ref 11.5–15.5)
WBC: 11 10*3/uL — AB (ref 4.0–10.5)

## 2017-07-09 LAB — BASIC METABOLIC PANEL
Anion gap: 10 (ref 5–15)
BUN: 19 mg/dL (ref 6–20)
CHLORIDE: 99 mmol/L — AB (ref 101–111)
CO2: 28 mmol/L (ref 22–32)
Calcium: 8.6 mg/dL — ABNORMAL LOW (ref 8.9–10.3)
Creatinine, Ser: 1.53 mg/dL — ABNORMAL HIGH (ref 0.61–1.24)
GFR calc non Af Amer: 48 mL/min — ABNORMAL LOW (ref >60)
GFR, EST AFRICAN AMERICAN: 55 mL/min — AB (ref >60)
Glucose, Bld: 131 mg/dL — ABNORMAL HIGH (ref 65–99)
POTASSIUM: 3.9 mmol/L (ref 3.5–5.1)
SODIUM: 137 mmol/L (ref 135–145)

## 2017-07-09 LAB — GLUCOSE, CAPILLARY: GLUCOSE-CAPILLARY: 124 mg/dL — AB (ref 65–99)

## 2017-07-09 MED ORDER — NICOTINE 7 MG/24HR TD PT24: 7.0000 mg | MEDICATED_PATCH | Freq: Every day | TRANSDERMAL | 0 refills | Status: AC

## 2017-07-09 MED ORDER — FUROSEMIDE 40 MG PO TABS: 40.0000 mg | ORAL_TABLET | ORAL | 1 refills | Status: AC | PRN

## 2017-07-09 MED ORDER — ISOSORB DINITRATE-HYDRALAZINE 20-37.5 MG PO TABS: 1.0000 | ORAL_TABLET | Freq: Three times a day (TID) | ORAL | 1 refills | Status: AC

## 2017-07-09 MED ORDER — ATORVASTATIN CALCIUM 80 MG PO TABS: 80.0000 mg | ORAL_TABLET | Freq: Every day | ORAL | 6 refills | Status: AC

## 2017-07-09 MED ORDER — TICAGRELOR 90 MG PO TABS
90.0000 mg | ORAL_TABLET | Freq: Two times a day (BID) | ORAL | 6 refills | Status: AC
Start: 2017-07-09 — End: ?

## 2017-07-09 NOTE — Discharge Summary (Addendum)
Physician Discharge Summary  Patient ID: Todd Sullivan MRN: [id number] DOB/AGE: Aug 31, 1956 61 y.o.  Admit date: 07/06/2017 Discharge date: 07/09/2017  Admission Diagnoses: STEMI Hypertension Type 2 DM Hyperlipidemia Morbid obesity Tobacco abuse CKD 3  Discharge Diagnoses:  STEMI: 07/06/2017 Acute on chronic systolic and diastolic heart failure, now compensated. EF 30-35% on echocardiogram 07/07/2017. Suspect primarily nonischemic hypertensive cardiomyopathy with acute insult from recent STEMI ACC/AHA stage C NYHA class II Hypertension: Better controlled Type 2 DM: well controlled Hyperlipidemia Morbid obesity Tobacco abuse CKD 3 Remote h/o PE. Not on anticoagulation now. Suspected obstructive sleep apnea  Discharged Condition: stable  Hospital Course:   Todd Sullivan is a 61 year old African-American male with hypertension, hyperlipidemia, type 2 diabetes mellitus, morbid obesity, diastolic heart failure who presented to the ED with 3 days of off-and-on chest pain or shortness of breath. On arrival to the ED he was in respiratory distress with 1 out of 10 chest pain. He was human dynamically stable. EKG showed inferior ST elevations and anterior ST depressions. Trop was elevated at 30 ng/mL. We were notified at 21:37 hrs. Patient was diagnosed with inferior STEMI and emergently taken to cath lab at 22:07 hrs.  Patient underwent successful primary PCI to dominant left circumflex and PDA with synergy drug-eluting stent 2.5 x 16 mm. Of note, patient's LVEDP was elevated at 31 mmHg.  On 07/07/17 early morning, patient was ion respiratory distress, likely due to combination of elevated LVEDP and contrast use for the procedure. He was aggressively diuresed and placed on BiPAP. NTG patch was placed for BP control. His creatinine did not increase significantly through the hospital stay. Telemetry showed occasional PVC's.  Over the next two days, he diuresed well and his breathing  improved. He does not have a known diagnosis of OSA, but was helped with BiPAP at night during the hospital stay. We will arrange for sleep study as outpatient.  His EF was decreased to 30-35%, likely a combination of nonischemic cardioymopathy with recent STEMI. He is on all GDMT for heart failure. I would like to wean him off clonidine and start him om Bidil. He has not had rebound hypertension on reducing clonidine frequency to once daily and I think he will tolerate discontinuation well. I have started him on Bidil 20-37.5 one cap tid to further aid his GDMT for heart failure. Given his borderline renal function and now lower blood pressures, I will not start him on Entresto at this time. I will ask him to hold lisinopril on Monday, Tuesday and Wednesday of the coming week, will check BMP and BNP on Wednesday and hopefully start him on Entresto on Thursday 07/15/2017.  He will continue ASA 81 mg lifelong, and ticagrelor 90 mg bid for at least 1 year. He was on xarelto in the past for remote PE but has not been on it for a while. Risks of bleeding outweigh the benefits in the setting of DAPT instituted now.  It appears that he has not taken insulin in many years and his A1C is controlled on metformin. Thus, I have discontinued his insulin on discharge. I will send a copy of this note to his PCP Dr Jackson Latino.   He ambulated without any difficulty and is ready for discharge today.   Consults: None  Significant Diagnostic Studies: IMPRESSION: 1. Stable cardiomegaly and pulmonary vascular congestion. No focal consolidation. 2. Prominent central pulmonary arteries may represent pulmonary artery hypertension.   Study Conclusions  - Left ventricle: The cavity size was moderately dilated.  Wall   thickness was increased in a pattern of moderate LVH. Systolic   function was moderately to severely reduced. The estimated   ejection fraction was in the range of 30% to 35%. Moderate   diffuse  hypokinesis with distinct regional wall motion   abnormalities. Akinesis of the inferolateral myocardium. Features   are consistent with a pseudonormal left ventricular filling   pattern, with concomitant abnormal relaxation and increased   filling pressure (grade 2 diastolic dysfunction). Elevated left   atrial pressure. - Mitral valve: There was mild to moderate regurgitation. - Left atrium: The atrium was moderately dilated. - Right atrium: The atrium was mildly dilated. - Tricuspid valve: There was mild-moderate regurgitation. - Pulmonary arteries: PA peak pressure: 34 mm Hg (S).  Treatments:   Procedures   Coronary/Graft Acute MI Revascularization  LEFT HEART CATH AND CORONARY ANGIOGRAPHY  Conclusion   Conclusion: Significant one vessel coronary artery disease Successful PTCA and stent placement  2.5 X 16 mm Synergy drug-eluting stent LCx PDA Elevated LVEDP     Discharge Exam: Blood pressure 117/75, pulse 73, temperature 98.4 F (36.9 C), temperature source Axillary, resp. rate (!) 22, height 6' (1.829 m), weight 129.5 kg (285 lb 8 oz), SpO2 100 %.  Physical Exam Nursing noteand vitalsreviewed. Constitutional: He is oriented to person, place, and time. He appears well-developed.  Morbidly obese HENT:  Head: Normocephalicand atraumatic.  Neck: JVD not apprecited.  Cardiovascular: Normal rateand regular rhythm.  No murmurheard. Respiratory: Effort normaland breath sounds normal.  B.l improved air entry GI: Soft. He exhibits no distension.  Musculoskeletal: He exhibits no edema.  Neurological: He is alertand oriented to person, place, and time.  Skin: Skin is warmand dry.    Disposition: 01-Home or Self Care  Discharge Instructions    AMB Referral to Cardiac Rehabilitation - Phase II    Complete by:  As directed    Diagnosis:  STEMI   Amb Referral to Cardiac Rehabilitation    Complete by:  As directed    Diagnosis:   STEMI Coronary Stents        Allergies as of 07/09/2017      Reactions   Banana    Sick   Egg Yolk Nausea And Vomiting   Kiwi Extract Nausea And Vomiting      Medication List    STOP taking these medications   cloNIDine 0.1 MG tablet Commonly known as:  CATAPRES   insulin aspart 100 UNIT/ML FlexPen Commonly known as:  NOVOLOG FLEXPEN   insulin aspart 100 UNIT/ML injection Commonly known as:  novoLOG   Insulin Glargine 100 UNIT/ML Solostar Pen Commonly known as:  LANTUS SOLOSTAR   Insulin Pen Starter Kit Misc   lisinopril 40 MG tablet Commonly known as:  PRINIVIL,ZESTRIL   pravastatin 20 MG tablet Commonly known as:  PRAVACHOL     TAKE these medications   albuterol 108 (90 Base) MCG/ACT inhaler Commonly known as:  PROVENTIL HFA;VENTOLIN HFA Inhale 2 puffs into the lungs every 4 (four) hours as needed. For shortness of breath.   aspirin EC 81 MG tablet Take 81 mg by mouth daily.   atorvastatin 80 MG tablet Commonly known as:  LIPITOR Take 1 tablet (80 mg total) by mouth daily at 6 PM.   budesonide-formoterol 160-4.5 MCG/ACT inhaler Commonly known as:  SYMBICORT Inhale 2 puffs into the lungs 2 (two) times daily.   carvedilol 25 MG tablet Commonly known as:  COREG Take 1 tablet (25 mg total) by mouth 2 (two)  times daily with a meal.   colchicine 0.6 MG tablet Take 0.6 mg by mouth at bedtime.   cyclobenzaprine 10 MG tablet Commonly known as:  FLEXERIL Take 10 mg by mouth 2 (two) times daily.   diltiazem 180 MG 24 hr capsule Commonly known as:  TIAZAC Take 360 mg by mouth at bedtime.   furosemide 40 MG tablet Commonly known as:  LASIX Take 1 tablet (40 mg total) by mouth as needed. For leg swelling and shortness of breath   isosorbide-hydrALAZINE 20-37.5 MG tablet Commonly known as:  BIDIL Take 1 tablet by mouth 3 (three) times daily.   metFORMIN 500 MG tablet Commonly known as:  GLUCOPHAGE Take 500 mg by mouth 2 (two) times daily with a meal.   nicotine 7 mg/24hr  patch Commonly known as:  NICODERM CQ - dosed in mg/24 hr Place 1 patch (7 mg total) onto the skin daily.   oxyCODONE-acetaminophen 10-325 MG tablet Commonly known as:  PERCOCET Take 1 tablet by mouth every 4 (four) hours as needed for pain.   spironolactone 25 MG tablet Commonly known as:  ALDACTONE Take 25 mg by mouth at bedtime.   ticagrelor 90 MG Tabs tablet Commonly known as:  BRILINTA Take 1 tablet (90 mg total) by mouth 2 (two) times daily.            Discharge Care Instructions        Start     Ordered   07/09/17 0000  atorvastatin (LIPITOR) 80 MG tablet  Daily-1800     07/09/17 0817   07/09/17 0000  nicotine (NICODERM CQ - DOSED IN MG/24 HR) 7 mg/24hr patch  Daily     07/09/17 0817   07/09/17 0000  ticagrelor (BRILINTA) 90 MG TABS tablet  2 times daily     07/09/17 0817   07/09/17 0000  Diet - low sodium heart healthy     07/09/17 0817   07/09/17 0000  Increase activity slowly     07/09/17 0817   07/09/17 0000  Heart Failure patients record your daily weight using the same scale at the same time of day     07/09/17 0817   07/09/17 0000  STOP any activity that causes chest pain, shortness of breath, dizziness, sweating, or exessive weakness     07/09/17 0817   07/09/17 0000  Call MD for:  difficulty breathing, headache or visual disturbances     07/09/17 0817   07/09/17 0000  Call MD for:  severe uncontrolled pain     07/09/17 0817   07/09/17 0000  isosorbide-hydrALAZINE (BIDIL) 20-37.5 MG tablet  3 times daily     07/09/17 0817   07/09/17 0000  furosemide (LASIX) 40 MG tablet  As needed     07/09/17 0817   07/08/17 0000  Amb Referral to Cardiac Rehabilitation    Question Answer Comment  Diagnosis: STEMI   Diagnosis: Coronary Stents      07/08/17 1457   07/06/17 0000  AMB Referral to Cardiac Rehabilitation - Phase II    Question:  Diagnosis:  Answer:  STEMI   07/06/17 2337       Signed: Joya Gaskins J Mesa Janus 07/09/2017, 7:38 AM  Nigel Mormon, MD [facility] [place] Pager: [phone] Office: [phone] If no answer Cell [phone]

## 2017-07-09 NOTE — Plan of Care (Signed)
Problem: Education: Goal: Knowledge of [facility] Education information/materials will improve Outcome: Progressing Patient aware of plan of care.  RN provided medication education on medications administered prior to administration, patient stated understanding.  Patient denied pain. RN instructed patient to notify RN immediately if patient started to experience any pain.  Patient agreeable to RN instruction.

## 2017-07-10 ENCOUNTER — Telehealth (HOSPITAL_COMMUNITY): Payer: Self-pay

## 2017-07-10 LAB — GLUCOSE, CAPILLARY: Glucose-Capillary: 148 mg/dL — ABNORMAL HIGH (ref 65–99)

## 2017-07-10 NOTE — Telephone Encounter (Signed)
Verified insurance. No co-payment, no deductible, no out of pocket amount, no co-insurance, no pre-authorization required. Passport/reference # [id number]

## 2017-09-15 ENCOUNTER — Telehealth (HOSPITAL_COMMUNITY): Payer: Self-pay

## 2017-09-15 NOTE — Telephone Encounter (Signed)
Attempted to call patient in regards to Cardiac Rehab - Lm on Vm °

## 2017-09-26 ENCOUNTER — Encounter (HOSPITAL_COMMUNITY): Payer: Self-pay

## 2017-09-26 ENCOUNTER — Telehealth (HOSPITAL_COMMUNITY): Payer: Self-pay

## 2017-09-26 NOTE — Telephone Encounter (Signed)
2nd attempt to call patient in regards to Cardiac Rehab - Lm on Vm. Sending letter. °

## 2017-10-05 ENCOUNTER — Telehealth (HOSPITAL_COMMUNITY): Payer: Self-pay

## 2017-10-05 NOTE — Telephone Encounter (Signed)
3rd attempt to call patient in regards to Cardiac Rehab - LM on Vm °

## 2017-10-12 ENCOUNTER — Telehealth (HOSPITAL_COMMUNITY): Payer: Self-pay

## 2017-10-12 NOTE — Telephone Encounter (Signed)
No response from patient in regards to Cardiac Rehab - Closed referral. °

## 2017-11-21 ENCOUNTER — Inpatient Hospital Stay (HOSPITAL_COMMUNITY): Payer: Medicare Other

## 2017-11-21 ENCOUNTER — Inpatient Hospital Stay (HOSPITAL_COMMUNITY)
Admission: EM | Admit: 2017-11-21 | Discharge: 2018-01-08 | DRG: 870 | Disposition: E | Payer: Medicare Other | Attending: Family Medicine | Admitting: Family Medicine

## 2017-11-21 ENCOUNTER — Emergency Department (HOSPITAL_COMMUNITY): Payer: Medicare Other

## 2017-11-21 ENCOUNTER — Encounter (HOSPITAL_COMMUNITY): Payer: Self-pay

## 2017-11-21 ENCOUNTER — Other Ambulatory Visit: Payer: Self-pay

## 2017-11-21 DIAGNOSIS — I513 Intracardiac thrombosis, not elsewhere classified: Secondary | ICD-10-CM | POA: Diagnosis present

## 2017-11-21 DIAGNOSIS — E872 Acidosis: Secondary | ICD-10-CM | POA: Diagnosis present

## 2017-11-21 DIAGNOSIS — J440 Chronic obstructive pulmonary disease with acute lower respiratory infection: Secondary | ICD-10-CM | POA: Diagnosis present

## 2017-11-21 DIAGNOSIS — Z7189 Other specified counseling: Secondary | ICD-10-CM

## 2017-11-21 DIAGNOSIS — Z9181 History of falling: Secondary | ICD-10-CM

## 2017-11-21 DIAGNOSIS — J189 Pneumonia, unspecified organism: Secondary | ICD-10-CM

## 2017-11-21 DIAGNOSIS — K122 Cellulitis and abscess of mouth: Secondary | ICD-10-CM | POA: Diagnosis not present

## 2017-11-21 DIAGNOSIS — E785 Hyperlipidemia, unspecified: Secondary | ICD-10-CM | POA: Diagnosis present

## 2017-11-21 DIAGNOSIS — E876 Hypokalemia: Secondary | ICD-10-CM | POA: Diagnosis not present

## 2017-11-21 DIAGNOSIS — K7200 Acute and subacute hepatic failure without coma: Secondary | ICD-10-CM | POA: Diagnosis present

## 2017-11-21 DIAGNOSIS — Z4659 Encounter for fitting and adjustment of other gastrointestinal appliance and device: Secondary | ICD-10-CM

## 2017-11-21 DIAGNOSIS — I5043 Acute on chronic combined systolic (congestive) and diastolic (congestive) heart failure: Secondary | ICD-10-CM | POA: Diagnosis present

## 2017-11-21 DIAGNOSIS — Z7984 Long term (current) use of oral hypoglycemic drugs: Secondary | ICD-10-CM

## 2017-11-21 DIAGNOSIS — R1312 Dysphagia, oropharyngeal phase: Secondary | ICD-10-CM | POA: Diagnosis present

## 2017-11-21 DIAGNOSIS — G9340 Encephalopathy, unspecified: Secondary | ICD-10-CM

## 2017-11-21 DIAGNOSIS — E162 Hypoglycemia, unspecified: Secondary | ICD-10-CM

## 2017-11-21 DIAGNOSIS — Z833 Family history of diabetes mellitus: Secondary | ICD-10-CM

## 2017-11-21 DIAGNOSIS — G9341 Metabolic encephalopathy: Secondary | ICD-10-CM | POA: Diagnosis present

## 2017-11-21 DIAGNOSIS — I428 Other cardiomyopathies: Secondary | ICD-10-CM | POA: Diagnosis not present

## 2017-11-21 DIAGNOSIS — E11649 Type 2 diabetes mellitus with hypoglycemia without coma: Secondary | ICD-10-CM | POA: Diagnosis present

## 2017-11-21 DIAGNOSIS — Z978 Presence of other specified devices: Secondary | ICD-10-CM

## 2017-11-21 DIAGNOSIS — Z23 Encounter for immunization: Secondary | ICD-10-CM | POA: Diagnosis present

## 2017-11-21 DIAGNOSIS — E871 Hypo-osmolality and hyponatremia: Secondary | ICD-10-CM | POA: Diagnosis present

## 2017-11-21 DIAGNOSIS — Z825 Family history of asthma and other chronic lower respiratory diseases: Secondary | ICD-10-CM

## 2017-11-21 DIAGNOSIS — Y92009 Unspecified place in unspecified non-institutional (private) residence as the place of occurrence of the external cause: Secondary | ICD-10-CM

## 2017-11-21 DIAGNOSIS — Z9114 Patient's other noncompliance with medication regimen: Secondary | ICD-10-CM

## 2017-11-21 DIAGNOSIS — R4182 Altered mental status, unspecified: Secondary | ICD-10-CM | POA: Diagnosis not present

## 2017-11-21 DIAGNOSIS — Z7902 Long term (current) use of antithrombotics/antiplatelets: Secondary | ICD-10-CM

## 2017-11-21 DIAGNOSIS — Z515 Encounter for palliative care: Secondary | ICD-10-CM

## 2017-11-21 DIAGNOSIS — R570 Cardiogenic shock: Secondary | ICD-10-CM | POA: Diagnosis not present

## 2017-11-21 DIAGNOSIS — J810 Acute pulmonary edema: Secondary | ICD-10-CM

## 2017-11-21 DIAGNOSIS — I5023 Acute on chronic systolic (congestive) heart failure: Secondary | ICD-10-CM | POA: Diagnosis not present

## 2017-11-21 DIAGNOSIS — R1310 Dysphagia, unspecified: Secondary | ICD-10-CM | POA: Diagnosis not present

## 2017-11-21 DIAGNOSIS — J181 Lobar pneumonia, unspecified organism: Secondary | ICD-10-CM | POA: Diagnosis present

## 2017-11-21 DIAGNOSIS — R0603 Acute respiratory distress: Secondary | ICD-10-CM | POA: Diagnosis present

## 2017-11-21 DIAGNOSIS — J9690 Respiratory failure, unspecified, unspecified whether with hypoxia or hypercapnia: Secondary | ICD-10-CM | POA: Diagnosis present

## 2017-11-21 DIAGNOSIS — M109 Gout, unspecified: Secondary | ICD-10-CM | POA: Diagnosis present

## 2017-11-21 DIAGNOSIS — Z955 Presence of coronary angioplasty implant and graft: Secondary | ICD-10-CM

## 2017-11-21 DIAGNOSIS — I509 Heart failure, unspecified: Secondary | ICD-10-CM

## 2017-11-21 DIAGNOSIS — I110 Hypertensive heart disease with heart failure: Secondary | ICD-10-CM | POA: Diagnosis present

## 2017-11-21 DIAGNOSIS — D649 Anemia, unspecified: Secondary | ICD-10-CM | POA: Diagnosis present

## 2017-11-21 DIAGNOSIS — R5381 Other malaise: Secondary | ICD-10-CM | POA: Diagnosis not present

## 2017-11-21 DIAGNOSIS — I2510 Atherosclerotic heart disease of native coronary artery without angina pectoris: Secondary | ICD-10-CM | POA: Diagnosis present

## 2017-11-21 DIAGNOSIS — J9601 Acute respiratory failure with hypoxia: Secondary | ICD-10-CM | POA: Diagnosis present

## 2017-11-21 DIAGNOSIS — R0602 Shortness of breath: Secondary | ICD-10-CM

## 2017-11-21 DIAGNOSIS — I252 Old myocardial infarction: Secondary | ICD-10-CM

## 2017-11-21 DIAGNOSIS — I427 Cardiomyopathy due to drug and external agent: Secondary | ICD-10-CM | POA: Diagnosis present

## 2017-11-21 DIAGNOSIS — I493 Ventricular premature depolarization: Secondary | ICD-10-CM | POA: Diagnosis present

## 2017-11-21 DIAGNOSIS — R4702 Dysphasia: Secondary | ICD-10-CM | POA: Diagnosis present

## 2017-11-21 DIAGNOSIS — G92 Toxic encephalopathy: Secondary | ICD-10-CM | POA: Diagnosis not present

## 2017-11-21 DIAGNOSIS — T405X1A Poisoning by cocaine, accidental (unintentional), initial encounter: Secondary | ICD-10-CM | POA: Diagnosis present

## 2017-11-21 DIAGNOSIS — A419 Sepsis, unspecified organism: Secondary | ICD-10-CM | POA: Diagnosis present

## 2017-11-21 DIAGNOSIS — Z6836 Body mass index (BMI) 36.0-36.9, adult: Secondary | ICD-10-CM

## 2017-11-21 DIAGNOSIS — R7401 Elevation of levels of liver transaminase levels: Secondary | ICD-10-CM

## 2017-11-21 DIAGNOSIS — R0902 Hypoxemia: Secondary | ICD-10-CM | POA: Diagnosis not present

## 2017-11-21 DIAGNOSIS — J9602 Acute respiratory failure with hypercapnia: Secondary | ICD-10-CM | POA: Diagnosis not present

## 2017-11-21 DIAGNOSIS — F17210 Nicotine dependence, cigarettes, uncomplicated: Secondary | ICD-10-CM | POA: Diagnosis present

## 2017-11-21 DIAGNOSIS — E870 Hyperosmolality and hypernatremia: Secondary | ICD-10-CM | POA: Diagnosis not present

## 2017-11-21 DIAGNOSIS — J9600 Acute respiratory failure, unspecified whether with hypoxia or hypercapnia: Secondary | ICD-10-CM

## 2017-11-21 DIAGNOSIS — N179 Acute kidney failure, unspecified: Secondary | ICD-10-CM | POA: Diagnosis present

## 2017-11-21 DIAGNOSIS — Z66 Do not resuscitate: Secondary | ICD-10-CM | POA: Diagnosis not present

## 2017-11-21 DIAGNOSIS — R6520 Severe sepsis without septic shock: Secondary | ICD-10-CM | POA: Diagnosis present

## 2017-11-21 DIAGNOSIS — I21A1 Myocardial infarction type 2: Secondary | ICD-10-CM | POA: Diagnosis present

## 2017-11-21 DIAGNOSIS — R918 Other nonspecific abnormal finding of lung field: Secondary | ICD-10-CM

## 2017-11-21 DIAGNOSIS — L245 Irritant contact dermatitis due to other chemical products: Secondary | ICD-10-CM | POA: Diagnosis not present

## 2017-11-21 DIAGNOSIS — F1410 Cocaine abuse, uncomplicated: Secondary | ICD-10-CM | POA: Diagnosis present

## 2017-11-21 DIAGNOSIS — G4733 Obstructive sleep apnea (adult) (pediatric): Secondary | ICD-10-CM | POA: Diagnosis present

## 2017-11-21 DIAGNOSIS — Z8673 Personal history of transient ischemic attack (TIA), and cerebral infarction without residual deficits: Secondary | ICD-10-CM

## 2017-11-21 DIAGNOSIS — I50814 Right heart failure due to left heart failure: Secondary | ICD-10-CM | POA: Diagnosis present

## 2017-11-21 DIAGNOSIS — Z9289 Personal history of other medical treatment: Secondary | ICD-10-CM

## 2017-11-21 DIAGNOSIS — R740 Nonspecific elevation of levels of transaminase and lactic acid dehydrogenase [LDH]: Secondary | ICD-10-CM

## 2017-11-21 DIAGNOSIS — I214 Non-ST elevation (NSTEMI) myocardial infarction: Secondary | ICD-10-CM

## 2017-11-21 DIAGNOSIS — R627 Adult failure to thrive: Secondary | ICD-10-CM | POA: Diagnosis present

## 2017-11-21 DIAGNOSIS — I255 Ischemic cardiomyopathy: Secondary | ICD-10-CM | POA: Diagnosis present

## 2017-11-21 DIAGNOSIS — R7989 Other specified abnormal findings of blood chemistry: Secondary | ICD-10-CM

## 2017-11-21 DIAGNOSIS — Z7951 Long term (current) use of inhaled steroids: Secondary | ICD-10-CM

## 2017-11-21 DIAGNOSIS — L8990 Pressure ulcer of unspecified site, unspecified stage: Secondary | ICD-10-CM

## 2017-11-21 DIAGNOSIS — R319 Hematuria, unspecified: Secondary | ICD-10-CM | POA: Diagnosis not present

## 2017-11-21 DIAGNOSIS — Z781 Physical restraint status: Secondary | ICD-10-CM

## 2017-11-21 DIAGNOSIS — H1089 Other conjunctivitis: Secondary | ICD-10-CM | POA: Diagnosis not present

## 2017-11-21 DIAGNOSIS — K5900 Constipation, unspecified: Secondary | ICD-10-CM | POA: Diagnosis present

## 2017-11-21 LAB — COMPREHENSIVE METABOLIC PANEL
ALK PHOS: 181 U/L — AB (ref 38–126)
ALK PHOS: 171 U/L — AB (ref 38–126)
ALT: 804 U/L — AB (ref 17–63)
ALT: 931 U/L — ABNORMAL HIGH (ref 17–63)
ANION GAP: 22 — AB (ref 5–15)
AST: 1073 U/L — ABNORMAL HIGH (ref 15–41)
AST: 1458 U/L — ABNORMAL HIGH (ref 15–41)
Albumin: 3.3 g/dL — ABNORMAL LOW (ref 3.5–5.0)
Albumin: 3.1 g/dL — ABNORMAL LOW (ref 3.5–5.0)
Anion gap: 30 — ABNORMAL HIGH (ref 5–15)
BUN: 53 mg/dL — ABNORMAL HIGH (ref 6–20)
BUN: 57 mg/dL — ABNORMAL HIGH (ref 6–20)
CALCIUM: 9.5 mg/dL (ref 8.9–10.3)
CALCIUM: 8.9 mg/dL (ref 8.9–10.3)
CHLORIDE: 95 mmol/L — AB (ref 101–111)
CO2: 13 mmol/L — AB (ref 22–32)
CO2: 18 mmol/L — AB (ref 22–32)
CREATININE: 3.18 mg/dL — AB (ref 0.61–1.24)
Chloride: 92 mmol/L — ABNORMAL LOW (ref 101–111)
Creatinine, Ser: 2.87 mg/dL — ABNORMAL HIGH (ref 0.61–1.24)
GFR calc non Af Amer: 20 mL/min — ABNORMAL LOW (ref >60)
GFR, EST AFRICAN AMERICAN: 23 mL/min — AB (ref >60)
GFR, EST AFRICAN AMERICAN: 26 mL/min — AB (ref >60)
GFR, EST NON AFRICAN AMERICAN: 22 mL/min — AB (ref >60)
Glucose, Bld: 28 mg/dL — CL (ref 65–99)
Glucose, Bld: 146 mg/dL — ABNORMAL HIGH (ref 65–99)
Potassium: 4.9 mmol/L (ref 3.5–5.1)
Potassium: 4.1 mmol/L (ref 3.5–5.1)
SODIUM: 135 mmol/L (ref 135–145)
Sodium: 135 mmol/L (ref 135–145)
Total Bilirubin: 5.4 mg/dL — ABNORMAL HIGH (ref 0.3–1.2)
Total Bilirubin: 5.9 mg/dL — ABNORMAL HIGH (ref 0.3–1.2)
Total Protein: 7.2 g/dL (ref 6.5–8.1)
Total Protein: 6.1 g/dL — ABNORMAL LOW (ref 6.5–8.1)

## 2017-11-21 LAB — RESPIRATORY PANEL BY PCR
Adenovirus: NOT DETECTED
BORDETELLA PERTUSSIS-RVPCR: NOT DETECTED
CHLAMYDOPHILA PNEUMONIAE-RVPPCR: NOT DETECTED
CORONAVIRUS HKU1-RVPPCR: NOT DETECTED
Coronavirus 229E: NOT DETECTED
Coronavirus NL63: NOT DETECTED
Coronavirus OC43: NOT DETECTED
INFLUENZA A H1-RVPPCR: NOT DETECTED
INFLUENZA A H3-RVPPCR: NOT DETECTED
INFLUENZA A-RVPPCR: NOT DETECTED
Influenza A H1 2009: NOT DETECTED
Influenza B: NOT DETECTED
METAPNEUMOVIRUS-RVPPCR: NOT DETECTED
Mycoplasma pneumoniae: NOT DETECTED
PARAINFLUENZA VIRUS 2-RVPPCR: NOT DETECTED
PARAINFLUENZA VIRUS 3-RVPPCR: NOT DETECTED
PARAINFLUENZA VIRUS 4-RVPPCR: NOT DETECTED
Parainfluenza Virus 1: NOT DETECTED
RESPIRATORY SYNCYTIAL VIRUS-RVPPCR: NOT DETECTED
RHINOVIRUS / ENTEROVIRUS - RVPPCR: NOT DETECTED

## 2017-11-21 LAB — TRIGLYCERIDES: Triglycerides: 113 mg/dL (ref <150)

## 2017-11-21 LAB — I-STAT CG4 LACTIC ACID, ED
Lactic Acid, Venous: 11.99 mmol/L (ref 0.5–1.9)
Lactic Acid, Venous: 9.52 mmol/L (ref 0.5–1.9)

## 2017-11-21 LAB — I-STAT ARTERIAL BLOOD GAS, ED
ACID-BASE DEFICIT: 6 mmol/L — AB (ref 0.0–2.0)
Acid-base deficit: 3 mmol/L — ABNORMAL HIGH (ref 0.0–2.0)
BICARBONATE: 21.4 mmol/L (ref 20.0–28.0)
BICARBONATE: 22.6 mmol/L (ref 20.0–28.0)
O2 Saturation: 100 %
O2 Saturation: 93 %
PO2 ART: 70 mmHg — AB (ref 83.0–108.0)
Patient temperature: 98
TCO2: 23 mmol/L (ref 22–32)
TCO2: 24 mmol/L (ref 22–32)
pCO2 arterial: 46.3 mmHg (ref 32.0–48.0)
pCO2 arterial: 42.6 mmHg (ref 32.0–48.0)
pH, Arterial: 7.273 — ABNORMAL LOW (ref 7.350–7.450)
pH, Arterial: 7.332 — ABNORMAL LOW (ref 7.350–7.450)
pO2, Arterial: 216 mmHg — ABNORMAL HIGH (ref 83.0–108.0)

## 2017-11-21 LAB — HEMOGLOBIN A1C
Hgb A1c MFr Bld: 6.5 % — ABNORMAL HIGH (ref 4.8–5.6)
Mean Plasma Glucose: 139.85 mg/dL

## 2017-11-21 LAB — URINALYSIS, ROUTINE W REFLEX MICROSCOPIC
Glucose, UA: NEGATIVE mg/dL
KETONES UR: NEGATIVE mg/dL
Leukocytes, UA: NEGATIVE
Nitrite: NEGATIVE
PROTEIN: 100 mg/dL — AB
Specific Gravity, Urine: 1.017 (ref 1.005–1.030)
pH: 5 (ref 5.0–8.0)

## 2017-11-21 LAB — RAPID URINE DRUG SCREEN, HOSP PERFORMED
Amphetamines: NOT DETECTED
BENZODIAZEPINES: NOT DETECTED
Barbiturates: NOT DETECTED
COCAINE: POSITIVE — AB
OPIATES: NOT DETECTED
Tetrahydrocannabinol: NOT DETECTED

## 2017-11-21 LAB — PROTIME-INR
INR: 2.51
PROTHROMBIN TIME: 26.9 s — AB (ref 11.4–15.2)

## 2017-11-21 LAB — HIV ANTIBODY (ROUTINE TESTING W REFLEX): HIV SCREEN 4TH GENERATION: NONREACTIVE

## 2017-11-21 LAB — GLUCOSE, CAPILLARY: GLUCOSE-CAPILLARY: 107 mg/dL — AB (ref 65–99)

## 2017-11-21 LAB — CBC
HCT: 37.7 % — ABNORMAL LOW (ref 39.0–52.0)
HEMATOCRIT: 41 % (ref 39.0–52.0)
Hemoglobin: 13.1 g/dL (ref 13.0–17.0)
Hemoglobin: 12.1 g/dL — ABNORMAL LOW (ref 13.0–17.0)
MCH: 25.6 pg — ABNORMAL LOW (ref 26.0–34.0)
MCH: 25.4 pg — AB (ref 26.0–34.0)
MCHC: 32 g/dL (ref 30.0–36.0)
MCHC: 32.1 g/dL (ref 30.0–36.0)
MCV: 80.2 fL (ref 78.0–100.0)
MCV: 79 fL (ref 78.0–100.0)
PLATELETS: 107 10*3/uL — AB (ref 150–400)
PLATELETS: 96 10*3/uL — AB (ref 150–400)
RBC: 5.11 MIL/uL (ref 4.22–5.81)
RBC: 4.77 MIL/uL (ref 4.22–5.81)
RDW: 18.9 % — AB (ref 11.5–15.5)
RDW: 18.6 % — ABNORMAL HIGH (ref 11.5–15.5)
WBC: 14.4 10*3/uL — ABNORMAL HIGH (ref 4.0–10.5)
WBC: 16.3 10*3/uL — ABNORMAL HIGH (ref 4.0–10.5)

## 2017-11-21 LAB — CK TOTAL AND CKMB (NOT AT ARMC)
CK, MB: 34.5 ng/mL — AB (ref 0.5–5.0)
RELATIVE INDEX: 1.8 (ref 0.0–2.5)
Total CK: 1926 U/L — ABNORMAL HIGH (ref 49–397)

## 2017-11-21 LAB — MAGNESIUM: MAGNESIUM: 1.8 mg/dL (ref 1.7–2.4)

## 2017-11-21 LAB — BASIC METABOLIC PANEL
ANION GAP: 27 — AB (ref 5–15)
BUN: 52 mg/dL — AB (ref 6–20)
CO2: 16 mmol/L — ABNORMAL LOW (ref 22–32)
Calcium: 9.5 mg/dL (ref 8.9–10.3)
Chloride: 91 mmol/L — ABNORMAL LOW (ref 101–111)
Creatinine, Ser: 3.07 mg/dL — ABNORMAL HIGH (ref 0.61–1.24)
GFR calc Af Amer: 24 mL/min — ABNORMAL LOW (ref >60)
GFR calc non Af Amer: 21 mL/min — ABNORMAL LOW (ref >60)
Glucose, Bld: 30 mg/dL — CL (ref 65–99)
POTASSIUM: 4.8 mmol/L (ref 3.5–5.1)
SODIUM: 134 mmol/L — AB (ref 135–145)

## 2017-11-21 LAB — CBG MONITORING, ED
GLUCOSE-CAPILLARY: 27 mg/dL — AB (ref 65–99)
GLUCOSE-CAPILLARY: 149 mg/dL — AB (ref 65–99)
Glucose-Capillary: 116 mg/dL — ABNORMAL HIGH (ref 65–99)
Glucose-Capillary: 81 mg/dL (ref 65–99)
Glucose-Capillary: 124 mg/dL — ABNORMAL HIGH (ref 65–99)
Glucose-Capillary: 141 mg/dL — ABNORMAL HIGH (ref 65–99)
Glucose-Capillary: 138 mg/dL — ABNORMAL HIGH (ref 65–99)
Glucose-Capillary: 146 mg/dL — ABNORMAL HIGH (ref 65–99)

## 2017-11-21 LAB — APTT: aPTT: 37 seconds — ABNORMAL HIGH (ref 24–36)

## 2017-11-21 LAB — INFLUENZA PANEL BY PCR (TYPE A & B)
INFLAPCR: NEGATIVE
INFLBPCR: NEGATIVE

## 2017-11-21 LAB — I-STAT TROPONIN, ED: Troponin i, poc: 0.25 ng/mL (ref 0.00–0.08)

## 2017-11-21 LAB — AMYLASE: AMYLASE: 44 U/L (ref 28–100)

## 2017-11-21 LAB — TROPONIN I
TROPONIN I: 0.06 ng/mL — AB (ref <0.03)
Troponin I: 0.05 ng/mL (ref <0.03)

## 2017-11-21 LAB — MRSA PCR SCREENING: MRSA BY PCR: NEGATIVE

## 2017-11-21 LAB — BRAIN NATRIURETIC PEPTIDE: B NATRIURETIC PEPTIDE 5: 3907.5 pg/mL — AB (ref 0.0–100.0)

## 2017-11-21 LAB — CORTISOL: Cortisol, Plasma: 43.1 ug/dL

## 2017-11-21 LAB — PHOSPHORUS: PHOSPHORUS: 5.4 mg/dL — AB (ref 2.5–4.6)

## 2017-11-21 LAB — TSH: TSH: 0.888 u[IU]/mL (ref 0.350–4.500)

## 2017-11-21 LAB — LACTIC ACID, PLASMA
LACTIC ACID, VENOUS: 5.8 mmol/L — AB (ref 0.5–1.9)
LACTIC ACID, VENOUS: 3.9 mmol/L — AB (ref 0.5–1.9)

## 2017-11-21 LAB — LIPASE, BLOOD: Lipase: 20 U/L (ref 11–51)

## 2017-11-21 LAB — PROCALCITONIN: PROCALCITONIN: 1.85 ng/mL

## 2017-11-21 LAB — CK: CK TOTAL: 1886 U/L — AB (ref 49–397)

## 2017-11-21 MED ORDER — FUROSEMIDE 10 MG/ML IJ SOLN
40.0000 mg | Freq: Four times a day (QID) | INTRAMUSCULAR | Status: AC
  Administered 2017-11-21 (×3): 40 mg via INTRAVENOUS
  Filled 2017-11-21 (×4): qty 4

## 2017-11-21 MED ORDER — ALBUTEROL SULFATE (2.5 MG/3ML) 0.083% IN NEBU
5.0000 mg | INHALATION_SOLUTION | Freq: Once | RESPIRATORY_TRACT | Status: AC
  Administered 2017-11-21: 5 mg via RESPIRATORY_TRACT
  Filled 2017-11-21: qty 6

## 2017-11-21 MED ORDER — SODIUM CHLORIDE 0.9 % IV SOLN
250.0000 mL | INTRAVENOUS | Status: DC | PRN
  Administered 2017-11-25 – 2017-11-30 (×3): 250 mL via INTRAVENOUS

## 2017-11-21 MED ORDER — VANCOMYCIN HCL 10 G IV SOLR
2000.0000 mg | Freq: Once | INTRAVENOUS | Status: AC
  Administered 2017-11-21: 2000 mg via INTRAVENOUS
  Filled 2017-11-21: qty 2000

## 2017-11-21 MED ORDER — PIPERACILLIN-TAZOBACTAM 3.375 G IVPB
3.3750 g | Freq: Three times a day (TID) | INTRAVENOUS | Status: DC
  Administered 2017-11-21 – 2017-11-22 (×3): 3.375 g via INTRAVENOUS
  Filled 2017-11-21 (×4): qty 50

## 2017-11-21 MED ORDER — INSULIN ASPART 100 UNIT/ML ~~LOC~~ SOLN
0.0000 [IU] | SUBCUTANEOUS | Status: DC
  Administered 2017-11-21 – 2017-11-29 (×19): 1 [IU] via SUBCUTANEOUS
  Administered 2017-11-30: 2 [IU] via SUBCUTANEOUS
  Administered 2017-11-30 (×3): 1 [IU] via SUBCUTANEOUS
  Administered 2017-11-30: 2 [IU] via SUBCUTANEOUS
  Administered 2017-12-01 (×2): 1 [IU] via SUBCUTANEOUS
  Administered 2017-12-01 (×2): 2 [IU] via SUBCUTANEOUS
  Administered 2017-12-01 – 2017-12-02 (×2): 1 [IU] via SUBCUTANEOUS
  Administered 2017-12-02 – 2017-12-03 (×7): 2 [IU] via SUBCUTANEOUS
  Administered 2017-12-03: 1 [IU] via SUBCUTANEOUS
  Administered 2017-12-03: 2 [IU] via SUBCUTANEOUS
  Administered 2017-12-03: 1 [IU] via SUBCUTANEOUS
  Administered 2017-12-04 (×3): 2 [IU] via SUBCUTANEOUS
  Filled 2017-11-21: qty 1

## 2017-11-21 MED ORDER — PHENYLEPHRINE 40 MCG/ML (10ML) SYRINGE FOR IV PUSH (FOR BLOOD PRESSURE SUPPORT)
PREFILLED_SYRINGE | INTRAVENOUS | Status: AC
  Filled 2017-11-21: qty 10

## 2017-11-21 MED ORDER — PROPOFOL 1000 MG/100ML IV EMUL
0.0000 ug/kg/min | INTRAVENOUS | Status: DC
  Administered 2017-11-21: 5 ug/kg/min via INTRAVENOUS

## 2017-11-21 MED ORDER — IPRATROPIUM BROMIDE 0.02 % IN SOLN
0.5000 mg | Freq: Once | RESPIRATORY_TRACT | Status: AC
  Administered 2017-11-21: 0.5 mg via RESPIRATORY_TRACT
  Filled 2017-11-21: qty 2.5

## 2017-11-21 MED ORDER — FENTANYL CITRATE (PF) 100 MCG/2ML IJ SOLN
100.0000 ug | INTRAMUSCULAR | Status: AC | PRN
  Administered 2017-11-21 – 2017-11-30 (×3): 100 ug via INTRAVENOUS
  Filled 2017-11-21 (×2): qty 2

## 2017-11-21 MED ORDER — PANTOPRAZOLE SODIUM 40 MG IV SOLR: 40.0000 mg | Freq: Every day | INTRAVENOUS | Status: DC

## 2017-11-21 MED ORDER — VITAL HIGH PROTEIN PO LIQD
1000.0000 mL | ORAL | Status: DC
  Administered 2017-11-21 (×2): 1000 mL

## 2017-11-21 MED ORDER — SODIUM CHLORIDE 0.9 % IV BOLUS (SEPSIS): 1000.0000 mL | Freq: Once | INTRAVENOUS | Status: DC

## 2017-11-21 MED ORDER — PRO-STAT SUGAR FREE PO LIQD
30.0000 mL | Freq: Two times a day (BID) | ORAL | Status: DC
  Administered 2017-11-21 – 2017-11-22 (×2): 30 mL
  Filled 2017-11-21 (×2): qty 30

## 2017-11-21 MED ORDER — DEXTROSE 50 % IV SOLN
1.0000 | Freq: Once | INTRAVENOUS | Status: AC
  Administered 2017-11-21: 50 mL via INTRAVENOUS

## 2017-11-21 MED ORDER — PROPOFOL 1000 MG/100ML IV EMUL
INTRAVENOUS | Status: AC
  Filled 2017-11-21: qty 100

## 2017-11-21 MED ORDER — PROPOFOL 1000 MG/100ML IV EMUL: 0.0000 ug/kg/min | INTRAVENOUS | Status: DC

## 2017-11-21 MED ORDER — VANCOMYCIN HCL 10 G IV SOLR
1750.0000 mg | INTRAVENOUS | Status: DC
  Administered 2017-11-23: 1750 mg via INTRAVENOUS
  Filled 2017-11-21: qty 1750

## 2017-11-21 MED ORDER — PIPERACILLIN-TAZOBACTAM 3.375 G IVPB 30 MIN
3.3750 g | Freq: Once | INTRAVENOUS | Status: AC
  Administered 2017-11-21: 3.375 g via INTRAVENOUS
  Filled 2017-11-21: qty 50

## 2017-11-21 MED ORDER — DEXTROSE 10 % IV SOLN
INTRAVENOUS | Status: DC
  Administered 2017-11-21: 06:00:00 via INTRAVENOUS

## 2017-11-21 MED ORDER — FUROSEMIDE 10 MG/ML IJ SOLN
40.0000 mg | Freq: Once | INTRAMUSCULAR | Status: AC
  Administered 2017-11-21: 40 mg via INTRAVENOUS
  Filled 2017-11-21: qty 4

## 2017-11-21 MED ORDER — DEXTROSE 50 % IV SOLN
INTRAVENOUS | Status: AC
  Filled 2017-11-21: qty 50

## 2017-11-21 MED ORDER — FENTANYL BOLUS VIA INFUSION
50.0000 ug | INTRAVENOUS | Status: DC | PRN
  Administered 2017-11-25 – 2017-11-28 (×3): 50 ug via INTRAVENOUS
  Filled 2017-11-21: qty 50

## 2017-11-21 MED ORDER — CHLORHEXIDINE GLUCONATE 0.12% ORAL RINSE (MEDLINE KIT)
15.0000 mL | Freq: Two times a day (BID) | OROMUCOSAL | Status: DC
  Administered 2017-11-21 – 2017-12-04 (×26): 15 mL via OROMUCOSAL

## 2017-11-21 MED ORDER — FENTANYL CITRATE (PF) 100 MCG/2ML IJ SOLN
INTRAMUSCULAR | Status: AC
  Administered 2017-11-21: 100 ug via INTRAVENOUS
  Filled 2017-11-21: qty 2

## 2017-11-21 MED ORDER — IPRATROPIUM-ALBUTEROL 0.5-2.5 (3) MG/3ML IN SOLN
3.0000 mL | RESPIRATORY_TRACT | Status: DC | PRN
  Administered 2017-12-06 – 2017-12-08 (×4): 3 mL via RESPIRATORY_TRACT
  Filled 2017-11-21 (×4): qty 3

## 2017-11-21 MED ORDER — FENTANYL CITRATE (PF) 100 MCG/2ML IJ SOLN
50.0000 ug | Freq: Once | INTRAMUSCULAR | Status: AC
  Administered 2017-11-21: 50 ug via INTRAVENOUS

## 2017-11-21 MED ORDER — ETOMIDATE 2 MG/ML IV SOLN
30.0000 mg | Freq: Once | INTRAVENOUS | Status: AC
  Administered 2017-11-21: 30 mg via INTRAVENOUS

## 2017-11-21 MED ORDER — DEXTROSE 50 % IV SOLN
1.0000 | Freq: Once | INTRAVENOUS | Status: AC
Start: 2017-11-21 — End: 2017-11-21
  Administered 2017-11-21: 50 mL via INTRAVENOUS

## 2017-11-21 MED ORDER — FENTANYL 2500MCG IN NS 250ML (10MCG/ML) PREMIX INFUSION
25.0000 ug/h | INTRAVENOUS | Status: DC
  Administered 2017-11-21: 25 ug/h via INTRAVENOUS
  Administered 2017-11-24 – 2017-11-25 (×2): 50 ug/h via INTRAVENOUS
  Administered 2017-11-28: 25 ug/h via INTRAVENOUS
  Filled 2017-11-21 (×4): qty 250

## 2017-11-21 MED ORDER — PROPOFOL 1000 MG/100ML IV EMUL
0.0000 ug/kg/min | INTRAVENOUS | Status: DC
  Administered 2017-11-21: 28 ug/kg/min via INTRAVENOUS
  Administered 2017-11-22 (×2): 15 ug/kg/min via INTRAVENOUS
  Administered 2017-11-22 (×2): 20 ug/kg/min via INTRAVENOUS
  Administered 2017-11-23: 15 ug/kg/min via INTRAVENOUS
  Filled 2017-11-21 (×10): qty 100

## 2017-11-21 MED ORDER — ORAL CARE MOUTH RINSE
15.0000 mL | OROMUCOSAL | Status: DC
  Administered 2017-11-21 – 2017-12-04 (×125): 15 mL via OROMUCOSAL

## 2017-11-21 MED ORDER — SODIUM CHLORIDE 0.9 % IV BOLUS (SEPSIS)
1000.0000 mL | Freq: Once | INTRAVENOUS | Status: AC
  Administered 2017-11-21: 1000 mL via INTRAVENOUS

## 2017-11-21 MED ORDER — VANCOMYCIN HCL IN DEXTROSE 1-5 GM/200ML-% IV SOLN: 1000.0000 mg | Freq: Once | INTRAVENOUS | Status: DC

## 2017-11-21 MED ORDER — PANTOPRAZOLE SODIUM 40 MG IV SOLR
40.0000 mg | Freq: Every day | INTRAVENOUS | Status: DC
  Administered 2017-11-21 – 2017-11-26 (×6): 40 mg via INTRAVENOUS
  Filled 2017-11-21 (×7): qty 40

## 2017-11-21 MED ORDER — SUCCINYLCHOLINE CHLORIDE 20 MG/ML IJ SOLN
200.0000 mg | Freq: Once | INTRAMUSCULAR | Status: AC
  Administered 2017-11-21: 200 mg via INTRAVENOUS
  Filled 2017-11-21: qty 10

## 2017-11-21 MED ORDER — FENTANYL CITRATE (PF) 100 MCG/2ML IJ SOLN
100.0000 ug | INTRAMUSCULAR | Status: DC | PRN
  Administered 2017-12-01: 100 ug via INTRAVENOUS
  Filled 2017-11-21: qty 2

## 2017-11-21 NOTE — ED Notes (Signed)
NS IV bolus held per( EDP ) Dr. Wilkie Aye .

## 2017-11-21 NOTE — H&P (Signed)
PULMONARY / CRITICAL CARE MEDICINE   Name: Todd Sullivan MRN: [id number] DOB: May 27, 1956    ADMISSION DATE:  12/06/2017 CONSULTATION DATE:  11/19/2017  REFERRING MD: Dr. Wilkie Aye  CHIEF COMPLAINT:  SOB  HISTORY OF PRESENT ILLNESS:   HPI obtained from medical chart review as patient is currently intubated and sedated on mechanical ventilation.  62 year old male with past medical history significant for COPD, asthma, diabetes, CAD status post STEMI/ stent 06/2017, hypertension, and CVA who presented via EMS with complaints of shortness of breath and altered mental status. His significant other reports she has not done well since he MI 3 months ago.  Also reports he has not taken his medications for the last 5 days.  He has not been out of bed for the last 5 days.  Found him after he fell out of bed called 911 and was transported to [facility].  On arrival to the emergency room he was unable to protect his airway and was intubated.  He is to have a CT of his head to rule out CVA and/or trauma since he has had a history of CVA in the past.  On my arrival in the emergency room to trauma a he was sedated with propofol and going to CT scan for evaluation.  Neuro exam was difficult due to high level of sedation.  Difficult due to high level sedation  Note he had profound hypoglycemia otherwise not on insulin but he is on metformin.  He had to be started on dextrose 10% drip to maintain his glucose levels greater than 90.  Labs noted for BUN 53, sCr 3.18 (baseline 1.5-1.6), lactic acid 11.99 -> 9.52, BNP 1886, AST 1073, ALT 804, t. Bili 5.4, WBC 14.4, Plts 107, TSH 0.888, initial CXR showing pulmonary edema   We will admit to the intensive care unit continue to explore laboratory data evaluate CT scan of the head.  Treat underlying lactic acidosis, evaluate for infectious process and cardiology is to evaluate.  PAST MEDICAL HISTORY :  He  has a past medical history of Anxiety, Asthma, CHF  (congestive heart failure) (HCC), COPD (chronic obstructive pulmonary disease) (HCC), Depression, Diabetes mellitus, Gout, Headache(784.0), Hypertension, Shortness of breath, and Stroke (HCC).  PAST SURGICAL HISTORY: He  has a past surgical history that includes Eye surgery (62 years old); LEFT HEART CATH AND CORONARY ANGIOGRAPHY (N/A, 07/06/2017); and Coronary/Graft Acute MI Revascularization (N/A, 07/06/2017).  Allergies  Allergen Reactions  . Banana     Sick   . Egg Yolk Nausea And Vomiting  . Kiwi Extract Nausea And Vomiting    No current facility-administered medications on file prior to encounter.    Current Outpatient Medications on File Prior to Encounter  Medication Sig  . albuterol (PROVENTIL HFA;VENTOLIN HFA) 108 (90 BASE) MCG/ACT inhaler Inhale 2 puffs into the lungs every 4 (four) hours as needed. For shortness of breath.  Marland Kitchen atorvastatin (LIPITOR) 80 MG tablet Take 1 tablet (80 mg total) by mouth daily at 6 PM.  . budesonide-formoterol (SYMBICORT) 160-4.5 MCG/ACT inhaler Inhale 2 puffs into the lungs 2 (two) times daily.  . colchicine 0.6 MG tablet Take 0.6 mg by mouth at bedtime.   . cyclobenzaprine (FLEXERIL) 10 MG tablet Take 10 mg by mouth 2 (two) times daily.  Marland Kitchen diltiazem (TIAZAC) 180 MG 24 hr capsule Take 360 mg by mouth at bedtime.  . furosemide (LASIX) 40 MG tablet Take 1 tablet (40 mg total) by mouth as needed. For leg swelling and shortness of  breath  . isosorbide-hydrALAZINE (BIDIL) 20-37.5 MG tablet Take 1 tablet by mouth 3 (three) times daily.  . metFORMIN (GLUCOPHAGE) 500 MG tablet Take 500 mg by mouth 2 (two) times daily with a meal.  . ticagrelor (BRILINTA) 90 MG TABS tablet Take 1 tablet (90 mg total) by mouth 2 (two) times daily.  . carvedilol (COREG) 25 MG tablet Take 1 tablet (25 mg total) by mouth 2 (two) times daily with a meal. (Patient not taking: Reported on 12-18-17)  . nicotine (NICODERM CQ - DOSED IN MG/24 HR) 7 mg/24hr patch Place 1 patch (7 mg  total) onto the skin daily. (Patient not taking: Reported on 12-18-17)    FAMILY HISTORY:  His indicated that the status of his mother is unknown. He indicated that the status of his sister is unknown. He indicated that the status of his brother is unknown. He indicated that the status of his son is unknown.   SOCIAL HISTORY: He  reports that he has been smoking cigarettes.  He has a 20.00 pack-year smoking history. he has never used smokeless tobacco. He reports that he does not drink alcohol or use drugs.  REVIEW OF SYSTEMS:   Not available  SUBJECTIVE:  Morbidly obese male sedated and intubated and on ventilator.  VITAL SIGNS: BP 102/82   Pulse 96   Resp 18   Ht 6\' 1"  (1.854 m)   Wt 275 lb 9.2 oz (125 kg)   SpO2 100%   BMI 36.36 kg/m   HEMODYNAMICS:    VENTILATOR SETTINGS: Vent Mode: PRVC FiO2 (%):  [100 %] 100 % Set Rate:  [16 bmp] 16 bmp Vt Set:  [630 mL] 630 mL PEEP:  [5 cmH20] 5 cmH20 Plateau Pressure:  [24 cmH20] 24 cmH20  INTAKE / OUTPUT: I/O last 3 completed shifts: In: 47 [IV Piggyback:50] Out: -   PHYSICAL EXAMINATION: General: Morbidly obese male who sedated with propofol and orotracheally intubated Neuro:.  Currently heavily sedated HEENT: Right IJ CVL is noted, endotracheal tube connected to ventilator.  Short neck.  Pupils equal reactive light. Cardiovascular: Heart sounds are distant Lungs: Coarse rhonchi bilaterally decreased in the bases Abdomen: Obese soft nontender Musculoskeletal: Intact Skin: Lower extremities with 3+ edema.  Noted to have venous stasis changes.  LABS:  BMET Recent Labs  Lab December 18, 2017 0404 2017/12/18 0429  NA 134* 135  K 4.8 4.9  CL 91* 92*  CO2 16* 13*  BUN 52* 53*  CREATININE 3.07* 3.18*  GLUCOSE 30* PENDING    Electrolytes Recent Labs  Lab 12-18-2017 0404 12-18-2017 0429  CALCIUM 9.5 9.5    CBC Recent Labs  Lab December 18, 2017 0404  WBC 14.4*  HGB 13.1  HCT 41.0  PLT 107*    Coag's No results for  input(s): APTT, INR in the last 168 hours.  Sepsis Markers Recent Labs  Lab 12/18/17 0412 2017-12-18 0538  LATICACIDVEN 11.99* 9.52*    ABG Recent Labs  Lab December 18, 2017 0520  PHART 7.273*  PCO2ART 46.3  PO2ART 216.0*    Liver Enzymes Recent Labs  Lab Dec 18, 2017 0429  AST 1,073*  ALT 804*  ALKPHOS 181*  BILITOT 5.4*  ALBUMIN 3.3*    Cardiac Enzymes No results for input(s): TROPONINI, PROBNP in the last 168 hours.  Glucose Recent Labs  Lab 12/18/2017 0355 12-18-17 0413 12-18-17 0502 12-18-17 0554  GLUCAP 27* 116* 81 149*    Imaging Dg Chest Portable 1 View  Result Date: 12-18-17 CLINICAL DATA:  Acute onset of shortness of breath. Hypoglycemia.  Altered mental status. EXAM: PORTABLE CHEST 1 VIEW COMPARISON:  Chest radiograph performed earlier today at 4:08 a.m. FINDINGS: The patient's endotracheal tube is seen ending 7 cm above the carina. An enteric tube is noted extending below the diaphragm. A small left pleural effusion is noted. Mildly increased interstitial markings could reflect minimal interstitial edema. No pneumothorax is seen. The cardiomediastinal silhouette is borderline normal in size. No acute osseous abnormalities are identified. IMPRESSION: 1. Endotracheal tube seen ending 7 cm above the carina. 2. Small left pleural effusion. Mildly increased interstitial markings could reflect minimal interstitial edema. Electronically Signed   By: Roanna Raider M.D.   On: 11/17/2017 06:02   Dg Chest Portable 1 View  Result Date: 12/04/2017 CLINICAL DATA:  62 y/o  M; shortness of breath and hypoglycemia. EXAM: PORTABLE CHEST 1 VIEW COMPARISON:  07/07/2017 chest radiograph FINDINGS: Stable cardiomegaly given projection and technique. Large central pulmonary arteries may represent pulmonary artery hypertension. Pulmonary vascular congestion. No consolidation or effusion. Multilevel degenerative changes of the spine. IMPRESSION: Cardiomegaly and pulmonary vascular congestion.  No focal consolidation. Large central pulmonary arteries may represent pulmonary artery hypertension. Electronically Signed   By: Mitzi Hansen M.D.   On: 12/05/2017 04:42     STUDIES:  12/06/2017 CT of the head>>  CULTURES: 12/01/2017 blood cultures x2>> 12/05/2017 sputum culture>> 12/03/2017 urine culture>> 11/27/2017 pro-calcitonin>> 11/22/2017 respiratory virus panel>>  ANTIBIOTICS: 12/01/2017 vancomycin>> 11/27/2017 Zosyn>>  SIGNIFICANT EVENTS: 11/28/2017 intubated for respiratory failure  LINES/TUBES: 11/17/2017 endotracheal tube>> 12/03/2017 right internal jugular CVL>>  DISCUSSION: 62 year old morbidly obese male who modality has been smoking recently states he is on NicoDerm patches.  He is noted to be and have been declining over the past 3 months following cardiac cath MI and stent placement.  He has not taken his medication for the last 5 days nor has he been out of bed for the last 5 days.  Significant other noted that he got up and fell during the night EMS was activated his transport to the emergency room he was unable to protect his airway and was intubated.  ASSESSMENT / PLAN:  PULMONARY A: Vent dependent respiratory failure secondary to inability to protect airway. P:   Vent bundle Bronchodilators as needed Chest x-ray post intubation Full vent support Adjust vent for ABG  CARDIOVASCULAR A:  Coronary artery disease history with MI 3 months ago Last 2D echo revealed EF of 30% left ventricular hypokinesis P:  12-lead EKG is unremarkable Place central line and monitor CVP pressures Cardiology consult appreciated  RENAL Lab Results  Component Value Date   CREATININE 3.18 (H) 11/28/2017   CREATININE 3.07 (H) 12/07/2017   CREATININE 1.53 (H) 07/09/2017   Recent Labs  Lab 11/11/2017 0404 11/16/2017 0429  NA 134* 135   Recent Labs  Lab 12/03/2017 0404 11/28/2017 0429  K 4.8 4.9   A:   Acute renal failure with last known creatinine 1.87 P:    Hydration as needed Serial monitoring of creatinine Check CK to evaluate for rhabdo  GASTROINTESTINAL A:   Morbid obesity GI protection P:   PPI Orogastric tube, if remains intubated greater than 24 hours for pursue tube feedings Consult nutrition for TF as per nutrition  HEMATOLOGIC Recent Labs    11/13/2017 0404  HGB 13.1   A:   No acute issues. DVT prophylaxis No overt need for anticoagulation at this time. P:  SCDs in place  INFECTIOUS A:   Elevated lactic acid and WBC  P:   Empiric antibiotics  with vancomycin and Zosyn started 11/27/2017 Pan culture and include respiratory virus panel and flu screen  ENDOCRINE CBG (last 3)  Recent Labs    12/06/2017 0554 11/28/2017 0712 11/20/2017 0749  GLUCAP 149* 124* 141*    A:   Diabetes mellitus Profound hypoglycemia P:   Hold all diabetic medications 10% dextrose currently and to keep glucose greater than 90 Monitor CBG every 4 hours  NEUROLOGIC A:   Altered mental status Fall a.m. 12/01/2017 P:   RASS goal: -1 CT of the head rule out trauma   FAMILY  - Updates: No family at bedside 11/15/2017  - Inter-disciplinary family meet or Palliative Care meeting due by:  day 7  App cct 60 min  Brett Canales Minor ACNP [facility] PCCM Pager [phone] till 1 pm If no answer page [phone] 11/23/2017, 8:35 AM  Attending Note:  62 year old male with extensive cardiac history who presents to the hospital with SOB and AMS.  Patient was intubated in the ED and PCCM was called on consultation.  Patient's BP is more stable post intubation with diffuse crackles on exam.  I reviewed CXR myself, ETT is high, will adjust but pulmonary edema noted.  Will begin slow diureses given renal function and marginal BP.  Begin TF for hypoglycemia.  Check procalcitonin for ?infection.  Pan culture.  Vanc/zosyn until cultures results.  Continue full vent support for now.  Check ABG and will adjust vent accordingly for ABG.  Cardiology consult  called.  The patient is critically ill with multiple organ systems failure and requires high complexity decision making for assessment and support, frequent evaluation and titration of therapies, application of advanced monitoring technologies and extensive interpretation of multiple databases.   Critical Care Time devoted to patient care services described in this note is  60  Minutes. This time reflects time of care of this signee Dr Koren Bound. This critical care time does not reflect procedure time, or teaching time or supervisory time of PA/NP/Med student/Med Resident etc but could involve care discussion time.  Alyson Reedy, M.D. [facility] Pulmonary/Critical Care Medicine. Pager: [phone]. After hours pager: [phone].

## 2017-11-21 NOTE — ED Provider Notes (Addendum)
Medical screening examination/treatment/procedure(s) were conducted as a shared visit with non-physician practitioner(s) and myself.  I personally evaluated the patient during the encounter.   EKG Interpretation  Date/Time:  Tuesday November 21 2017 04:09:50 EST Ventricular Rate:  97 PR Interval:    QRS Duration: 111 QT Interval:  354 QTC Calculation: 450 R Axis:   110 Text Interpretation:  Sinus tachycardia Paired ventricular premature complexes Prolonged PR interval Low voltage with right axis deviation Nonspecific T abnormalities, inferior leads Confirmed by Ross Marcus ([id number]) on 11/25/2017 5:49:46 AM       Patient presents with altered mental status.  Found to be severely hypoglycemic with blood sugar in the 20s.  Mental status waxes and wanes.  Initially in a flat problem mildly tachycardic.  Complaining of shortness of breath.  Appears encephalopathic.  Some improvement with D50 but this waxes and wanes.  At times has marginal blood pressures but is fluid responsive.  He appears grossly volume overloaded on exam with coarse crackles and anasarca to the abdomen.  Feel he would benefit from positive pressure; however, he is not a good candidate for BiPAP given his mental status changes.  ABG does not show any significant hypercarbia.  Patient was intubated.  Lactate noted to be 12.  Do not feel this is related to sepsis.  Patient's family is at bedside.  Report deterioration since hospitalization.  He has not been out of bed in over 5 days.  Patient does not contribute to history taking.  Patient was given 500 cc of fluid but ultimately given Lasix for concern for decompensated heart failure.  The patient is noted to have a lactate>4. With the current information available to me, I don't think the patient is in septic shock. The lactate>4, is related to respiratory distress/ respiratory failureand heart failure. Physical Exam  BP (!) 114/57   Pulse 93   Temp 98.1 F (36.7 C)   Resp  17   Ht 6\' 1"  (1.854 m)   Wt 125 kg (275 lb 9.2 oz)   SpO2 100%   BMI 36.36 kg/m   Physical Exam  Encephalopathic, waxing and waning mental status Increased respiratory rate, coarse crackles in all lung fields Initially volume overloaded with anasarca  ED Course/Procedures   Clinical Course as of Nov 21 756  Tue Nov 21, 2017  0531 Echo 07/07/17: Study Conclusions  - Left ventricle: The cavity size was moderately dilated. Wall   thickness was increased in a pattern of moderate LVH. Systolic   function was moderately to severely reduced. The estimated   ejection fraction was in the range of 30% to 35%. Moderate   diffuse hypokinesis with distinct regional wall motion   abnormalities. Akinesis of the inferolateral myocardium. Features   are consistent with a pseudonormal left ventricular filling   pattern, with concomitant abnormal relaxation and increased   filling pressure (grade 2 diastolic dysfunction). Elevated left   atrial pressure. - Mitral valve: There was mild to moderate regurgitation. - Left atrium: The atrium was moderately dilated. - Right atrium: The atrium was mildly dilated. - Tricuspid valve: There was mild-moderate regurgitation. - Pulmonary arteries: PA peak pressure: 34 mm Hg (S).  [HM]  [phone] Discussed with Dr. Hosie Poisson who will admit to PCCM.  Discussed 30ml/kg bolus and do no feel this is sepsis at this time.  He is in agreement to hold fluids at this time.  [HM]    Clinical Course User Index [HM] Muthersbaugh, Boyd Kerbs    .[employer]  Date/Time: 11/25/2017 7:58 AM Performed by: Shon Baton, MD Authorized by: Shon Baton, MD   Consent:    Consent obtained:  Emergent situation   Alternatives discussed:  No treatment Pre-procedure details:    Hand hygiene: Hand hygiene performed prior to insertion     Skin preparation:  2% chlorhexidine   Skin preparation agent: Skin preparation agent completely dried prior to procedure    Sedation:    Sedation type:  Deep Anesthesia (see MAR for exact dosages):    Anesthesia method:  Local infiltration Procedure details:    Location:  R internal jugular   Patient position:  Reverse Trendelenburg   Procedural supplies:  Triple lumen   Catheter size:  7 Fr   Landmarks identified: yes     Ultrasound guidance: yes     Sterile ultrasound techniques: Sterile gel and sterile probe covers were used     Number of attempts:  1   Successful placement: yes   Post-procedure details:    Post-procedure:  Dressing applied   Assessment:  Blood return through all ports, no pneumothorax on x-ray, free fluid flow and placement verified by x-ray   Patient tolerance of procedure:  Tolerated well, no immediate complications   CRITICAL CARE Performed by: Shon Baton   Total critical care time: 75 minutes  Critical care time was exclusive of separately billable procedures and treating other patients.  Critical care was necessary to treat or prevent imminent or life-threatening deterioration.  Critical care was time spent personally by me on the following activities: development of treatment plan with patient and/or surrogate as well as nursing, discussions with consultants, evaluation of patient's response to treatment, examination of patient, obtaining history from patient or surrogate, ordering and performing treatments and interventions, ordering and review of laboratory studies, ordering and review of radiographic studies, pulse oximetry and re-evaluation of patient's condition.  MDM   Patient intubated.  Cardiology and critical care to evaluate the patient.  Central line was placed and patient appeared volume up with placement.  Placement confirmed on x-ray.      Shon Baton, MD 12/04/2017 1610    Shon Baton, MD 11/16/2017 [phone]

## 2017-11-21 NOTE — ED Notes (Signed)
Pt back in room from CT 

## 2017-11-21 NOTE — ED Notes (Signed)
Todd Sullivan [phone] Girlfriend and primary contact

## 2017-11-21 NOTE — Progress Notes (Signed)
   11/23/2017 1200  Clinical Encounter Type  Visited With Patient and family together  Visit Type ED  Stress Factors  Family Stress Factors Health changes   Rounding on ED encountered patient's girlfriend.  Patient is intubated and resting.  They are waiting on a room.  Girlfriend indicate patient has a son and daughter who live locally and a sister (other siblings in other places)  Girlfriend shared some about patient's health issues, but thankful they are here so he can get some rest and get better.  Offered hospitality, but nothing needed at this time.  Will follow as needed. Chaplain Agustin Cree

## 2017-11-21 NOTE — Consult Note (Signed)
Reason for Consult: Shortness of breath Referring Physician: Shortness of breath  Todd Sullivan is an 62 y.o. male.  HPI:   62 year old African-American male with hypertension, hyperlipidemia, type 2 diabetes mellitus, COPD, morbid obesity, diastolic heart failure, STEMI in 06/2017 s/p successful primary PCI to dominant left circumflex and PDA with synergy drug-eluting stent 2.5 x 16 mm. H/o CVA.   History is obtained from patient's partner Sherlyn Hay, as patient is intubated and sedated. He has been having shortness of breath and leg swelling at baseline. However, for the last couple of days, he has had worsening generalized fatigue and nonproductive cough. On 12/04/2017, he had worsening confusion and had a collapse at home that led to her calling the EMS. For last 5 days, he has not been taking any of his medications. On arrival, patient was in respiratory distress and had to be intubated. Workup was significant for leukocytosis, elevated lactic acid up to 11, next respiratory failure. BNP is elevated at 3347. He is currently intubated and sedated with improvement in his overall metabolic panel. Chest x-ray is concerning for left lobar pneumonia. Blood cultures and respiratory panel is pending.     Past Medical History:  Diagnosis Date  . Anxiety   . Asthma   . CHF (congestive heart failure) ([place])   . COPD (chronic obstructive pulmonary disease) ([place])   . Depression   . Diabetes mellitus   . Gout   . Headache(784.0)   . Hypertension   . Shortness of breath   . Stroke [facility])     Past Surgical History:  Procedure Laterality Date  . CORONARY/GRAFT ACUTE MI REVASCULARIZATION N/A 07/06/2017   Procedure: Coronary/Graft Acute MI Revascularization;  Surgeon: Nigel Mormon, MD;  Location: [place] [facility];  Service: Cardiovascular;  Laterality: N/A;  . EYE SURGERY  62 years old  . LEFT HEART CATH AND CORONARY ANGIOGRAPHY N/A 07/06/2017   Procedure: LEFT HEART CATH AND CORONARY  ANGIOGRAPHY;  Surgeon: Nigel Mormon, MD;  Location: [place] [facility];  Service: Cardiovascular;  Laterality: N/A;    Family History  Problem Relation Age of Onset  . Diabetes type II Mother   . Asthma Mother   . Diabetes type II Sister   . Asthma Sister   . Asthma Brother   . Asthma Son     Social History:  reports that he has been smoking cigarettes.  He has a 20.00 pack-year smoking history. he has never used smokeless tobacco. He reports that he does not drink alcohol or use drugs.  Allergies:  Allergies  Allergen Reactions  . Banana     Sick   . Egg Yolk Nausea And Vomiting  . Kiwi Extract Nausea And Vomiting    Medications: I have reviewed the patient's current medications.  Results for orders placed or performed during the hospital encounter of 12/01/2017 (from the past 48 hour(s))  CBG monitoring, ED     Status: Abnormal   Collection Time: 11/25/2017  3:55 AM  Result Value Ref Range   Glucose-Capillary 27 (LL) 65 - 99 mg/dL  Basic metabolic panel     Status: Abnormal   Collection Time: 11/29/2017  4:04 AM  Result Value Ref Range   Sodium 134 (L) 135 - 145 mmol/L   Potassium 4.8 3.5 - 5.1 mmol/L   Chloride 91 (L) 101 - 111 mmol/L   CO2 16 (L) 22 - 32 mmol/L   Glucose, Bld 30 (LL) 65 - 99 mg/dL    Comment: CRITICAL RESULT  CALLED TO, READ BACK BY AND VERIFIED WITH: OLSON,E RN 12/04/2017 0452 JORDANS    BUN 52 (H) 6 - 20 mg/dL   Creatinine, Ser 3.07 (H) 0.61 - 1.24 mg/dL   Calcium 9.5 8.9 - 10.3 mg/dL   GFR calc non Af Amer 21 (L) >60 mL/min   GFR calc Af Amer 24 (L) >60 mL/min    Comment: (NOTE) The eGFR has been calculated using the CKD EPI equation. This calculation has not been validated in all clinical situations. eGFR's persistently <60 mL/min signify possible Chronic Kidney Disease.    Anion gap 27 (H) 5 - 15    Comment: RESULT CHECKED Performed at [facility], [place] [address] [place], [place] [address]   CBC     Status: Abnormal    Collection Time: 12/06/2017  4:04 AM  Result Value Ref Range   WBC 14.4 (H) 4.0 - 10.5 K/uL   RBC 5.11 4.22 - 5.81 MIL/uL   Hemoglobin 13.1 13.0 - 17.0 g/dL   HCT 41.0 39.0 - 52.0 %   MCV 80.2 78.0 - 100.0 fL   MCH 25.6 (L) 26.0 - 34.0 pg   MCHC 32.0 30.0 - 36.0 g/dL   RDW 18.9 (H) 11.5 - 15.5 %   Platelets 107 (L) 150 - 400 K/uL    Comment: PLATELET COUNT CONFIRMED BY SMEAR Performed at [facility] [address] [place], [place] [address]   Triglycerides     Status: None   Collection Time: 12/07/2017  4:04 AM  Result Value Ref Range   Triglycerides 113 <150 mg/dL    Comment: Performed at [facility] [address] [place], [place] [address]  I-stat troponin, ED     Status: Abnormal   Collection Time: 11/23/2017  4:08 AM  Result Value Ref Range   Troponin i, poc 0.25 (HH) 0.00 - 0.08 ng/mL   Comment NOTIFIED PHYSICIAN    Comment 3            Comment: Due to the release kinetics of cTnI, a negative result within the first hours of the onset of symptoms does not rule out myocardial infarction with certainty. If myocardial infarction is still suspected, repeat the test at appropriate intervals.   I-Stat CG4 Lactic Acid, ED     Status: Abnormal   Collection Time: 12/07/2017  4:12 AM  Result Value Ref Range   Lactic Acid, Venous 11.99 (HH) 0.5 - 1.9 mmol/L   Comment NOTIFIED PHYSICIAN   CBG monitoring, ED     Status: Abnormal   Collection Time: 11/10/2017  4:13 AM  Result Value Ref Range   Glucose-Capillary 116 (H) 65 - 99 mg/dL  Comprehensive metabolic panel     Status: Abnormal   Collection Time: 12/03/2017  4:29 AM  Result Value Ref Range   Sodium 135 135 - 145 mmol/L   Potassium 4.9 3.5 - 5.1 mmol/L   Chloride 92 (L) 101 - 111 mmol/L   CO2 13 (L) 22 - 32 mmol/L   Glucose, Bld 28 (LL) 65 - 99 mg/dL    Comment: CRITICAL RESULT CALLED TO, READ BACK BY AND VERIFIED WITH: NOONAM,K RN 11/10/2017 0709 JORDANS    BUN 53 (H) 6 - 20 mg/dL   Creatinine, Ser 3.18 (H)  0.61 - 1.24 mg/dL   Calcium 9.5 8.9 - 10.3 mg/dL   Total Protein 7.2 6.5 - 8.1 g/dL   Albumin 3.3 (L) 3.5 - 5.0 g/dL   AST 1,073 (H) 15 -  41 U/L   ALT 804 (H) 17 - 63 U/L   Alkaline Phosphatase 181 (H) 38 - 126 U/L   Total Bilirubin 5.4 (H) 0.3 - 1.2 mg/dL   GFR calc non Af Amer 20 (L) >60 mL/min   GFR calc Af Amer 23 (L) >60 mL/min    Comment: (NOTE) The eGFR has been calculated using the CKD EPI equation. This calculation has not been validated in all clinical situations. eGFR's persistently <60 mL/min signify possible Chronic Kidney Disease.    Anion gap 30 (H) 5 - 15    Comment: RESULT CHECKED Performed at [facility], [place] [address] [place], [place] [address]   Brain natriuretic peptide     Status: Abnormal   Collection Time: 12/02/2017  4:32 AM  Result Value Ref Range   B Natriuretic Peptide 3,907.5 (H) 0.0 - 100.0 pg/mL    Comment: Performed at [facility] [address] [place], [place] [address]  CK     Status: Abnormal   Collection Time: 11/19/2017  4:39 AM  Result Value Ref Range   Total CK 1,886 (H) 49 - 397 U/L    Comment: Performed at [facility], [place] [address] [place], [place] [address]  CBG monitoring, ED     Status: None   Collection Time: 11/13/2017  5:02 AM  Result Value Ref Range   Glucose-Capillary 81 65 - 99 mg/dL  I-Stat arterial blood gas, ED     Status: Abnormal   Collection Time: 11/28/2017  5:20 AM  Result Value Ref Range   pH, Arterial 7.273 (L) 7.350 - 7.450   pCO2 arterial 46.3 32.0 - 48.0 mmHg   pO2, Arterial 216.0 (H) 83.0 - 108.0 mmHg   Bicarbonate 21.4 20.0 - 28.0 mmol/L   TCO2 23 22 - 32 mmol/L   O2 Saturation 100.0 %   Acid-base deficit 6.0 (H) 0.0 - 2.0 mmol/L   Patient temperature 98.6 F    Collection site RADIAL, ALLEN'S TEST ACCEPTABLE    Drawn by RT    Sample type ARTERIAL   TSH     Status: None   Collection Time: 12/07/2017  5:26 AM  Result Value Ref Range   TSH 0.888 0.350 - 4.500 uIU/mL    Comment:  Performed by a 3rd Generation assay with a functional sensitivity of <=0.01 uIU/mL. Performed at [facility], [place] [address], [place], [place] [address]   I-Stat CG4 Lactic Acid, ED     Status: Abnormal   Collection Time: 11/23/2017  5:38 AM  Result Value Ref Range   Lactic Acid, Venous 9.52 (HH) 0.5 - 1.9 mmol/L   Comment NOTIFIED PHYSICIAN   CBG monitoring, ED     Status: Abnormal   Collection Time: 11/11/2017  5:54 AM  Result Value Ref Range   Glucose-Capillary 149 (H) 65 - 99 mg/dL  Urinalysis, Routine w reflex microscopic     Status: Abnormal   Collection Time: 11/13/2017  6:05 AM  Result Value Ref Range   Color, Urine AMBER (A) YELLOW    Comment: BIOCHEMICALS MAY BE AFFECTED BY COLOR   APPearance CLOUDY (A) CLEAR   Specific Gravity, Urine 1.017 1.005 - 1.030   pH 5.0 5.0 - 8.0   Glucose, UA NEGATIVE NEGATIVE mg/dL   Hgb urine dipstick LARGE (A) NEGATIVE   Bilirubin Urine SMALL (A) NEGATIVE   Ketones, ur NEGATIVE NEGATIVE mg/dL   Protein, ur 100 (A) NEGATIVE mg/dL   Nitrite NEGATIVE NEGATIVE  Leukocytes, UA NEGATIVE NEGATIVE   RBC / HPF 6-30 0 - 5 RBC/hpf   WBC, UA 6-30 0 - 5 WBC/hpf   Bacteria, UA RARE (A) NONE SEEN   Squamous Epithelial / LPF 0-5 (A) NONE SEEN   Mucus PRESENT    Hyaline Casts, UA PRESENT    Granular Casts, UA PRESENT    Non Squamous Epithelial 0-5 (A) NONE SEEN    Comment: Performed at [facility], [place] [address], [place], [place] [address]  Urine rapid drug screen (hosp performed)     Status: Abnormal   Collection Time: 11/14/2017  6:05 AM  Result Value Ref Range   Opiates NONE DETECTED NONE DETECTED   Cocaine POSITIVE (A) NONE DETECTED   Benzodiazepines NONE DETECTED NONE DETECTED   Amphetamines NONE DETECTED NONE DETECTED   Tetrahydrocannabinol NONE DETECTED NONE DETECTED   Barbiturates NONE DETECTED NONE DETECTED    Comment: (NOTE) DRUG SCREEN FOR MEDICAL PURPOSES ONLY.  IF CONFIRMATION IS NEEDED FOR ANY PURPOSE, NOTIFY LAB WITHIN 5  DAYS. LOWEST DETECTABLE LIMITS FOR URINE DRUG SCREEN Drug Class                     Cutoff (ng/mL) Amphetamine and metabolites    1000 Barbiturate and metabolites    200 Benzodiazepine                 505 Tricyclics and metabolites     300 Opiates and metabolites        300 Cocaine and metabolites        300 THC                            50 Performed at [facility], [place] [address] [place], [place] [address]   CBG monitoring, ED     Status: Abnormal   Collection Time: 11/19/2017  7:12 AM  Result Value Ref Range   Glucose-Capillary 124 (H) 65 - 99 mg/dL  CBG monitoring, ED     Status: Abnormal   Collection Time: 11/19/2017  7:49 AM  Result Value Ref Range   Glucose-Capillary 141 (H) 65 - 99 mg/dL  Comprehensive metabolic panel     Status: Abnormal   Collection Time: 11/12/2017  8:23 AM  Result Value Ref Range   Sodium 135 135 - 145 mmol/L   Potassium 4.1 3.5 - 5.1 mmol/L   Chloride 95 (L) 101 - 111 mmol/L   CO2 18 (L) 22 - 32 mmol/L   Glucose, Bld 146 (H) 65 - 99 mg/dL   BUN 57 (H) 6 - 20 mg/dL   Creatinine, Ser 2.87 (H) 0.61 - 1.24 mg/dL   Calcium 8.9 8.9 - 10.3 mg/dL   Total Protein 6.1 (L) 6.5 - 8.1 g/dL   Albumin 3.1 (L) 3.5 - 5.0 g/dL   AST 1,458 (H) 15 - 41 U/L   ALT 931 (H) 17 - 63 U/L   Alkaline Phosphatase 171 (H) 38 - 126 U/L   Total Bilirubin 5.9 (H) 0.3 - 1.2 mg/dL   GFR calc non Af Amer 22 (L) >60 mL/min   GFR calc Af Amer 26 (L) >60 mL/min    Comment: (NOTE) The eGFR has been calculated using the CKD EPI equation. This calculation has not been validated in all clinical situations. eGFR's persistently <60 mL/min signify possible Chronic Kidney Disease.    Anion gap 22 (H) 5 - 15    Comment: Performed  at [facility], [place] [address] [place], [place] [address]  Magnesium     Status: None   Collection Time: 12/03/2017  8:23 AM  Result Value Ref Range   Magnesium 1.8 1.7 - 2.4 mg/dL    Comment: Performed at [facility] [address],  [place], [place] [address]  Phosphorus     Status: Abnormal   Collection Time: 11/25/2017  8:23 AM  Result Value Ref Range   Phosphorus 5.4 (H) 2.5 - 4.6 mg/dL    Comment: Performed at [facility] [address], [place], [place] [address]  Procalcitonin     Status: None   Collection Time: 11/24/2017  8:23 AM  Result Value Ref Range   Procalcitonin 1.85 ng/mL    Comment:        Interpretation: PCT > 0.5 ng/mL and <= 2 ng/mL: Systemic infection (sepsis) is possible, but other conditions are known to elevate PCT as well. (NOTE)       Sepsis PCT Algorithm           Lower Respiratory Tract                                      Infection PCT Algorithm    ----------------------------     ----------------------------         PCT < 0.25 ng/mL                PCT < 0.10 ng/mL         Strongly encourage             Strongly discourage   discontinuation of antibiotics    initiation of antibiotics    ----------------------------     -----------------------------       PCT 0.25 - 0.50 ng/mL            PCT 0.10 - 0.25 ng/mL               OR       >80% decrease in PCT            Discourage initiation of                                            antibiotics      Encourage discontinuation           of antibiotics    ----------------------------     -----------------------------         PCT >= 0.50 ng/mL              PCT 0.26 - 0.50 ng/mL                AND       <80% decrease in PCT             Encourage initiation of                                             antibiotics       Encourage continuation           of antibiotics    ----------------------------     -----------------------------  PCT >= 0.50 ng/mL                  PCT > 0.50 ng/mL               AND         increase in PCT                  Strongly encourage                                      initiation of antibiotics    Strongly encourage escalation           of antibiotics                                      -----------------------------                                           PCT <= 0.25 ng/mL                                                 OR                                        > 80% decrease in PCT                                     Discontinue / Do not initiate                                             antibiotics Performed at [facility], [address] [place], [place] [address]   CBC     Status: Abnormal   Collection Time: 11/24/2017  8:23 AM  Result Value Ref Range   WBC 16.3 (H) 4.0 - 10.5 K/uL    Comment: REPEATED TO VERIFY   RBC 4.77 4.22 - 5.81 MIL/uL   Hemoglobin 12.1 (L) 13.0 - 17.0 g/dL    Comment: REPEATED TO VERIFY   HCT 37.7 (L) 39.0 - 52.0 %   MCV 79.0 78.0 - 100.0 fL   MCH 25.4 (L) 26.0 - 34.0 pg   MCHC 32.1 30.0 - 36.0 g/dL   RDW 18.6 (H) 11.5 - 15.5 %   Platelets 96 (L) 150 - 400 K/uL    Comment: REPEATED TO VERIFY CONSISTENT WITH PREVIOUS RESULT PLATELET COUNT CONFIRMED BY SMEAR Performed at [facility], [place] [address] [place], [place] [address]   Protime-INR     Status: Abnormal   Collection Time: 11/26/2017  8:23 AM  Result Value Ref Range   Prothrombin Time 26.9 (H) 11.4 - 15.2 seconds   INR 2.51     Comment: Performed at [facility] [address], [place], [place]  [address]  APTT     Status: Abnormal   Collection Time: 12/01/2017  8:23 AM  Result Value Ref Range   aPTT 37 (H) 24 - 36 seconds    Comment:        IF BASELINE aPTT IS ELEVATED, SUGGEST PATIENT RISK ASSESSMENT BE USED TO DETERMINE APPROPRIATE ANTICOAGULANT THERAPY. Performed at [facility], [place] [address] [place], [place] [address]   Amylase     Status: None   Collection Time: 11/25/2017  8:23 AM  Result Value Ref Range   Amylase 44 28 - 100 U/L    Comment: Performed at [facility] [address], [place], [place] [address]  Lipase, blood     Status: None   Collection Time: 12/04/2017  8:23 AM  Result Value Ref Range   Lipase 20 11 - 51 U/L     Comment: Performed at [facility] [address], [place], [place] [address]  Troponin I     Status: Abnormal   Collection Time: 11/27/2017  8:23 AM  Result Value Ref Range   Troponin I 0.05 (HH) <0.03 ng/mL    Comment: CRITICAL RESULT CALLED TO, READ BACK BY AND VERIFIED WITH: R.RICHARDS,RN 12/01/2017 0946 BY BSLADE Performed at [facility], [place] [address], [place], [place] [address]   Lactic acid, plasma     Status: Abnormal   Collection Time: 11/29/2017  8:23 AM  Result Value Ref Range   Lactic Acid, Venous 5.8 (HH) 0.5 - 1.9 mmol/L    Comment: CRITICAL RESULT CALLED TO, READ BACK BY AND VERIFIED WITH: RUTH RICHARDS,RN AT 1497 11/10/2017 BY ZBEECH. Performed at [facility], [place] [address] [place], [place] [address]   CK total and CKMB (cardiac)not at [facility]     Status: Abnormal   Collection Time: 11/30/2017  8:23 AM  Result Value Ref Range   Total CK 1,926 (H) 49 - 397 U/L   CK, MB 34.5 (H) 0.5 - 5.0 ng/mL   Relative Index 1.8 0.0 - 2.5    Comment: Performed at [facility] [address] [place], [place] [address]  I-Stat arterial blood gas, ED     Status: Abnormal   Collection Time: 11/25/2017  8:49 AM  Result Value Ref Range   pH, Arterial 7.332 (L) 7.350 - 7.450   pCO2 arterial 42.6 32.0 - 48.0 mmHg   pO2, Arterial 70.0 (L) 83.0 - 108.0 mmHg   Bicarbonate 22.6 20.0 - 28.0 mmol/L   TCO2 24 22 - 32 mmol/L   O2 Saturation 93.0 %   Acid-base deficit 3.0 (H) 0.0 - 2.0 mmol/L   Patient temperature 98.0 F    Collection site RADIAL, ALLEN'S TEST ACCEPTABLE    Drawn by Operator    Sample type ARTERIAL   CBG monitoring, ED     Status: Abnormal   Collection Time: 11/29/2017  9:01 AM  Result Value Ref Range   Glucose-Capillary 138 (H) 65 - 99 mg/dL    Ct Head Wo Contrast  Result Date: 11/30/2017 CLINICAL DATA:  Altered mental status, unexplained altered level of consciousness, history stroke, hypertension, COPD, CHF, asthma EXAM: CT HEAD WITHOUT CONTRAST  TECHNIQUE: Contiguous axial images were obtained from the base of the skull through the vertex without intravenous contrast. Sagittal and coronal MPR images reconstructed from axial data set. COMPARISON:  None FINDINGS: Brain: Normal ventricular morphology. No midline shift or mass effect. Normal appearance of brain parenchyma. No intracranial hemorrhage, mass  lesion or evidence of acute infarction. No extra-axial fluid collections. Vascular: Atherosclerotic calcification of internal carotid arteries at skull base Skull: Intact Sinuses/Orbits: Clear Other: Endotracheal and nasogastric tubes noted at oral cavity IMPRESSION: No acute intracranial abnormalities. Electronically Signed   By: Lavonia Dana M.D.   On: 11/22/2017 08:19   Dg Chest Portable 1 View  Result Date: 11/24/2017 CLINICAL DATA:  Status post central line placement. History of MI 3 months ago, CHF, COPD-asthma, diabetes. Previous CVA. EXAM: PORTABLE CHEST 1 VIEW COMPARISON:  Portable chest x-ray of November 11, 2017 FINDINGS: There has been placement of right internal jugular venous catheter. The tip of the catheter projects over the junction of the middle and distal thirds of the SVC. There no postprocedure pneumothorax. The right lung is adequately inflated and grossly clear. On the left there is persistent increased density in the mid and lower lung with obscuration of the hemidiaphragm. The cardiac silhouette is enlarged. The endotracheal tube tip projects 6.2 cm above the carina. The esophagogastric tube tip projects below the inferior margin of the image. IMPRESSION: No postprocedure complication following right internal jugular venous catheter placement. Stable enlargement of the cardiac silhouette without significant pulmonary edema. Persistent atelectasis or pneumonia in the left lower lobe slightly more conspicuous today. The other support tubes are in reasonable position. Electronically Signed   By: David  Martinique M.D.   On: 12/03/2017 07:47    Dg Chest Portable 1 View  Result Date: 12/01/2017 CLINICAL DATA:  Acute onset of shortness of breath. Hypoglycemia. Altered mental status. EXAM: PORTABLE CHEST 1 VIEW COMPARISON:  Chest radiograph performed earlier today at 4:08 a.m. FINDINGS: The patient's endotracheal tube is seen ending 7 cm above the carina. An enteric tube is noted extending below the diaphragm. A small left pleural effusion is noted. Mildly increased interstitial markings could reflect minimal interstitial edema. No pneumothorax is seen. The cardiomediastinal silhouette is borderline normal in size. No acute osseous abnormalities are identified. IMPRESSION: 1. Endotracheal tube seen ending 7 cm above the carina. 2. Small left pleural effusion. Mildly increased interstitial markings could reflect minimal interstitial edema. Electronically Signed   By: Garald Balding M.D.   On: 11/12/2017 06:02   Dg Chest Portable 1 View  Result Date: 12/07/2017 CLINICAL DATA:  62 y/o  M; shortness of breath and hypoglycemia. EXAM: PORTABLE CHEST 1 VIEW COMPARISON:  07/07/2017 chest radiograph FINDINGS: Stable cardiomegaly given projection and technique. Large central pulmonary arteries may represent pulmonary artery hypertension. Pulmonary vascular congestion. No consolidation or effusion. Multilevel degenerative changes of the spine. IMPRESSION: Cardiomegaly and pulmonary vascular congestion. No focal consolidation. Large central pulmonary arteries may represent pulmonary artery hypertension. Electronically Signed   By: Kristine Garbe M.D.   On: 11/28/2017 04:42    Review of Systems  Unable to perform ROS: Intubated   Blood pressure 115/74, pulse 94, temperature 98.1 F (36.7 C), resp. rate 19, height 6' 1"  (1.854 m), weight 125 kg (275 lb 9.2 oz), SpO2 100 %. Physical Exam  Nursing note and vitals reviewed. Constitutional: He appears well-developed.  Morbidly obese. Intubated, sedated   HENT:  Head: Normocephalic.  Eyes:  Scleral icterus is present.  Neck: Normal range of motion. Neck supple. JVD present.  Cardiovascular: Normal rate and regular rhythm.  Murmur (II/VI holosystolic murmur RLSB) heard. Distant heart sounds.   Respiratory: Effort normal.  B/l coarse breath sounds   GI: Soft. Bowel sounds are normal. He exhibits distension.  Musculoskeletal: Normal range of motion. He exhibits edema (2+ b/l).  Lymphadenopathy:    He has no cervical adenopathy.  Neurological:  Intubated, sedated   Skin: Skin is warm and dry.  Psychiatric:  Not assessed   EKG 12/04/2017: Sinus rhythm. Normal axis. IVCD.Left atrial enlargement. Old inferior infarct. PVCs.  Assessment: Acute Respiratory failure Sepsis, likely due to pneumonia, respiratory panel pending Acute on chronic diastolic heart failure CAD status post  primary PCI LCx PDA Synergy DES 2.5 X 16 mm 07/09/2017 AKI/CKD Hypertension Type 2 DM: well controlled Hyperlipidemia Morbid obesity Tobacco abuse   Recommendations:  Agree with current management of respiratory failure per critical care team. Continue IV Lasix 40 mg every 6 hours. I suspect acute trigger is pneumonia, perhaps influenza infection. Continue aspirin 81 mg, Brilinta 90 mg twice a day. S/p primary PCI LCx PDA Synergy DES 2.5 X 16 mm ASA 81 mg lifelong, brilinta 90 mg bid for at least 1 year Baseline home medications include Coreg 25 mg bid, spironolactone 25 mg daily, Bidil 20-37.5 mg tid. Could resume as allowed by his hemodynamic status in the setting of sepsis.    Nury Nebergall J Earlisha Sharples 12/06/2017, 10:27 AM   Berdie Malter Esther Hardy, MD [facility] Pager: [phone] Office: [phone] If no answer Cell [phone]

## 2017-11-21 NOTE — ED Notes (Signed)
RN with Pt to CT

## 2017-11-21 NOTE — ED Notes (Signed)
Dr. Wilkie Aye at bedside inserting central line on pt.

## 2017-11-21 NOTE — Progress Notes (Signed)
Renae Fickle, NP with CCM was notified of low urine output despite lasix 40 and 1700.  No new orders at this time.

## 2017-11-21 NOTE — Procedures (Signed)
Intubation Procedure Note [employer] [id number] 1956/04/09  Procedure: Intubation Indications: Airway protection and maintenance  Procedure Details Consent: Risks of procedure as well as the alternatives and risks of each were explained to the (patient/caregiver).  Consent for procedure obtained. Time Out: Verified patient identification, verified procedure, site/side was marked, verified correct patient position, special equipment/implants available, medications/allergies/relevent history reviewed, required imaging and test results available.  Performed  Maximum sterile technique was used including gloves, hand hygiene and mask.  MAC and 4    Evaluation Hemodynamic Status: BP stable throughout; O2 sats: stable throughout Patient's Current Condition: stable Complications: No apparent complications Patient did tolerate procedure well. Chest X-ray ordered to verify placement.  CXR: tube position acceptable.   Vivien Rossetti 11/23/2017

## 2017-11-21 NOTE — ED Notes (Addendum)
Central line cart set up at bedside . Vancomycin IV /D10% IV infusing , Temp foley /IV sites intact . OGT unremarkable .

## 2017-11-21 NOTE — ED Provider Notes (Addendum)
[facility] EMERGENCY DEPARTMENT Provider Note   CSN: [id number] Arrival date & time: 11/10/2017  0344     History   Chief Complaint Chief Complaint  Patient presents with  . Shortness of Breath  . Hypoglycemia    HPI Todd Sullivan is a 62 y.o. male with a hx of COPD, asthma, anxiety, STEMI (s/p stent approx 3 mos ago), HTN, CVA, SOB presents to the Emergency Department with significant other.  Patient is altered and unable to provide any history.  LEVEL 5 CAVEAT for AMS  Patient significant other reports, patient has been declining since his MI approximately 3 months ago.  She reports he has become generally weaker and weaker over time.  She reports patient has not been out of the bed in the last 3 days.  She reports that tonight he seemed confused and attempted to get out of bed.  This caused him to fall and this prompted her to contact 911.  She reports that it has been 4-5 days since patient has taken any of his medications and he has had almost nothing to eat or drink in the last 3 days.  She denies fevers at home, vomiting, diarrhea.  Patient reports that patient has appeared to have a hard time breathing but has not complained of shortness of breath.  She reports that when she asks him how he feels, he states he feels fine and is breathing fine.  She reports that at baseline he is alert and oriented, able to ambulate and care for himself.  The history is provided by medical records and a significant other. No language interpreter was used.    Past Medical History:  Diagnosis Date  . Anxiety   . Asthma   . CHF (congestive heart failure) ([place])   . COPD (chronic obstructive pulmonary disease) ([place])   . Depression   . Diabetes mellitus   . Gout   . Headache(784.0)   . Hypertension   . Shortness of breath   . Stroke [facility])     Patient Active Problem List   Diagnosis Date Noted  . STEMI (ST elevation myocardial infarction) ([place]) 07/06/2017  .  Hyperosmolar non-ketotic state in patient with type 2 diabetes mellitus ([place]) 02/16/2013  . ARF (acute renal failure) ([place]) 02/16/2013  . Dehydration 02/16/2013  . Chronic systolic heart failure ([place]) 05/06/2012  . Obesities, morbid ([place]) 09/15/2011  . VTE (venous thromboembolism) 09/15/2011  . Gout attack 09/15/2011  . Nicotine abuse 09/15/2011  . Systolic and diastolic CHF, acute on chronic ([place]) 09/15/2011  . Respiratory failure, acute ([place]) 09/14/2011  . COPD exacerbation ([place]) 09/14/2011  . EMPHYSEMA 08/13/2009  . DM 07/30/2009  . HYPERLIPIDEMIA 07/30/2009  . HYPERTENSION 07/30/2009  . CONGESTIVE HEART FAILURE 07/30/2009  . STROKE 07/30/2009  . ALLERGIC RHINITIS 07/30/2009  . DYSPNEA 07/30/2009    Past Surgical History:  Procedure Laterality Date  . CORONARY/GRAFT ACUTE MI REVASCULARIZATION N/A 07/06/2017   Procedure: Coronary/Graft Acute MI Revascularization;  Surgeon: Nigel Mormon, MD;  Location: [facility] CV LAB;  Service: Cardiovascular;  Laterality: N/A;  . EYE SURGERY  62 years old  . LEFT HEART CATH AND CORONARY ANGIOGRAPHY N/A 07/06/2017   Procedure: LEFT HEART CATH AND CORONARY ANGIOGRAPHY;  Surgeon: Nigel Mormon, MD;  Location: [place] [facility];  Service: Cardiovascular;  Laterality: N/A;       Home Medications    Prior to Admission medications   Medication Sig Start Date End Date Taking? Authorizing Provider  albuterol (PROVENTIL HFA;VENTOLIN HFA) 108 (90 BASE) MCG/ACT inhaler Inhale 2 puffs into the lungs every 4 (four) hours as needed. For shortness of breath. 09/19/11  Yes Bonnielee Haff, MD  atorvastatin (LIPITOR) 80 MG tablet Take 1 tablet (80 mg total) by mouth daily at 6 PM. 07/09/17  Yes Patwardhan, Manish J, MD  budesonide-formoterol (SYMBICORT) 160-4.5 MCG/ACT inhaler Inhale 2 puffs into the lungs 2 (two) times daily. 09/19/11  Yes Bonnielee Haff, MD  colchicine 0.6 MG tablet Take 0.6 mg by mouth at bedtime.  06/02/17  Yes [provider]  cyclobenzaprine (FLEXERIL) 10 MG tablet Take 10 mg by mouth 2 (two) times daily.   Yes [provider]  diltiazem (TIAZAC) 180 MG 24 hr capsule Take 360 mg by mouth at bedtime.   Yes [provider]  furosemide (LASIX) 40 MG tablet Take 1 tablet (40 mg total) by mouth as needed. For leg swelling and shortness of breath 07/09/17  Yes Patwardhan, Manish J, MD  isosorbide-hydrALAZINE (BIDIL) 20-37.5 MG tablet Take 1 tablet by mouth 3 (three) times daily. 07/09/17  Yes Patwardhan, Manish J, MD  metFORMIN (GLUCOPHAGE) 500 MG tablet Take 500 mg by mouth 2 (two) times daily with a meal.   Yes [provider]  ticagrelor (BRILINTA) 90 MG TABS tablet Take 1 tablet (90 mg total) by mouth 2 (two) times daily. 07/09/17  Yes Patwardhan, Manish J, MD  carvedilol (COREG) 25 MG tablet Take 1 tablet (25 mg total) by mouth 2 (two) times daily with a meal. Patient not taking: Reported on 11/25/2017 01/04/13 11/22/23  Bensimhon, Shaune Pascal, MD  nicotine (NICODERM CQ - DOSED IN MG/24 HR) 7 mg/24hr patch Place 1 patch (7 mg total) onto the skin daily. Patient not taking: Reported on 12/03/2017 07/09/17   Nigel Mormon, MD    Family History Family History  Problem Relation Age of Onset  . Diabetes type II Mother   . Asthma Mother   . Diabetes type II Sister   . Asthma Sister   . Asthma Brother   . Asthma Son     Social History Social History   Tobacco Use  . Smoking status: Current Every Day Smoker    Packs/day: 0.50    Years: 40.00    Pack years: 20.00    Types: Cigarettes  . Smokeless tobacco: Never Used  Substance Use Topics  . Alcohol use: No  . Drug use: No     Allergies   Banana; Egg yolk; and Kiwi extract   Review of Systems Review of Systems  Unable to perform ROS: Mental status change     Physical Exam Updated Vital Signs BP 108/66   Pulse 98   Resp (!) 33   SpO2 100%   Physical Exam  Constitutional: He appears well-developed and  well-nourished. He appears lethargic. He appears ill. No distress.  Patient largely unresponsive but does withdraw from pain.  He is unable to answer questions.  HENT:  Head: Normocephalic.  Eyes: Conjunctivae are normal. No scleral icterus.  Exophthalmos  Neck: Normal range of motion. Neck supple. JVD present. No thyromegaly present.  Cardiovascular: Intact distal pulses. An irregular rhythm present. Tachycardia present.  Pulses:      Radial pulses are 2+ on the right side, and 2+ on the left side.  Pulmonary/Chest: Accessory muscle usage present. Tachypnea noted. He is in respiratory distress. He has decreased breath sounds. He has rales ( throughout).  Abdominal: He exhibits distension.  Abdomen is distended with  anasarca  Genitourinary:  Genitourinary Comments: No open wounds or sacral ulcers, perineum is soft without induration  Musculoskeletal: Normal range of motion. He exhibits edema.  Significant peripheral edema no open wounds.  Neurological: He appears lethargic. GCS eye subscore is 2. GCS verbal subscore is 2. GCS motor subscore is 4.  Skin: Skin is warm and dry.  No open wounds or cellulitis  Nursing note and vitals reviewed.    ED Treatments / Results  Labs (all labs ordered are listed, but only abnormal results are displayed) Labs Reviewed  BASIC METABOLIC PANEL - Abnormal; Notable for the following components:      Result Value   Sodium 134 (*)    Chloride 91 (*)    CO2 16 (*)    Glucose, Bld 30 (*)    BUN 52 (*)    Creatinine, Ser 3.07 (*)    GFR calc non Af Amer 21 (*)    GFR calc Af Amer 24 (*)    Anion gap 27 (*)    All other components within normal limits  CBC - Abnormal; Notable for the following components:   WBC 14.4 (*)    MCH 25.6 (*)    RDW 18.9 (*)    Platelets 107 (*)    All other components within normal limits  COMPREHENSIVE METABOLIC PANEL - Abnormal; Notable for the following components:   Chloride 92 (*)    CO2 13 (*)    Glucose,  Bld 28 (*)    BUN 53 (*)    Creatinine, Ser 3.18 (*)    Albumin 3.3 (*)    AST 1,073 (*)    ALT 804 (*)    Alkaline Phosphatase 181 (*)    Total Bilirubin 5.4 (*)    GFR calc non Af Amer 20 (*)    GFR calc Af Amer 23 (*)    Anion gap 30 (*)    All other components within normal limits  URINALYSIS, ROUTINE W REFLEX MICROSCOPIC - Abnormal; Notable for the following components:   Color, Urine AMBER (*)    APPearance CLOUDY (*)    Hgb urine dipstick LARGE (*)    Bilirubin Urine SMALL (*)    Protein, ur 100 (*)    Bacteria, UA RARE (*)    Squamous Epithelial / LPF 0-5 (*)    Non Squamous Epithelial 0-5 (*)    All other components within normal limits  CK - Abnormal; Notable for the following components:   Total CK 1,886 (*)    All other components within normal limits  I-STAT TROPONIN, ED - Abnormal; Notable for the following components:   Troponin i, poc 0.25 (*)    All other components within normal limits  I-STAT CG4 LACTIC ACID, ED - Abnormal; Notable for the following components:   Lactic Acid, Venous 11.99 (*)    All other components within normal limits  CBG MONITORING, ED - Abnormal; Notable for the following components:   Glucose-Capillary 27 (*)    All other components within normal limits  CBG MONITORING, ED - Abnormal; Notable for the following components:   Glucose-Capillary 116 (*)    All other components within normal limits  I-STAT ARTERIAL BLOOD GAS, ED - Abnormal; Notable for the following components:   pH, Arterial 7.273 (*)    pO2, Arterial 216.0 (*)    Acid-base deficit 6.0 (*)    All other components within normal limits  I-STAT CG4 LACTIC ACID, ED - Abnormal; Notable for the following components:  Lactic Acid, Venous 9.52 (*)    All other components within normal limits  CBG MONITORING, ED - Abnormal; Notable for the following components:   Glucose-Capillary 149 (*)    All other components within normal limits  CBG MONITORING, ED - Abnormal; Notable  for the following components:   Glucose-Capillary 124 (*)    All other components within normal limits  CULTURE, BLOOD (ROUTINE X 2)  CULTURE, BLOOD (ROUTINE X 2)  TSH  TRIGLYCERIDES  BRAIN NATRIURETIC PEPTIDE  T4  CBG MONITORING, ED    EKG  EKG Interpretation  Date/Time:  Tuesday November 21 2017 04:09:50 EST Ventricular Rate:  97 PR Interval:    QRS Duration: 111 QT Interval:  354 QTC Calculation: 450 R Axis:   110 Text Interpretation:  Sinus tachycardia Paired ventricular premature complexes Prolonged PR interval Low voltage with right axis deviation Nonspecific T abnormalities, inferior leads Confirmed by Thayer Jew [phone]) on 11/16/2017 5:49:46 AM       Radiology Dg Chest Portable 1 View  Result Date: 11/30/2017 CLINICAL DATA:  Acute onset of shortness of breath. Hypoglycemia. Altered mental status. EXAM: PORTABLE CHEST 1 VIEW COMPARISON:  Chest radiograph performed earlier today at 4:08 a.m. FINDINGS: The patient's endotracheal tube is seen ending 7 cm above the carina. An enteric tube is noted extending below the diaphragm. A small left pleural effusion is noted. Mildly increased interstitial markings could reflect minimal interstitial edema. No pneumothorax is seen. The cardiomediastinal silhouette is borderline normal in size. No acute osseous abnormalities are identified. IMPRESSION: 1. Endotracheal tube seen ending 7 cm above the carina. 2. Small left pleural effusion. Mildly increased interstitial markings could reflect minimal interstitial edema. Electronically Signed   By: Garald Balding M.D.   On: 11/13/2017 06:02   Dg Chest Portable 1 View  Result Date: 12/03/2017 CLINICAL DATA:  62 y/o  M; shortness of breath and hypoglycemia. EXAM: PORTABLE CHEST 1 VIEW COMPARISON:  07/07/2017 chest radiograph FINDINGS: Stable cardiomegaly given projection and technique. Large central pulmonary arteries may represent pulmonary artery hypertension. Pulmonary vascular  congestion. No consolidation or effusion. Multilevel degenerative changes of the spine. IMPRESSION: Cardiomegaly and pulmonary vascular congestion. No focal consolidation. Large central pulmonary arteries may represent pulmonary artery hypertension. Electronically Signed   By: Kristine Garbe M.D.   On: 11/10/2017 04:42    Procedures Procedure Name: Intubation Date/Time: 12/05/2017 7:20 AM Performed by: Abigail Butts, PA-C Pre-anesthesia Checklist: Patient identified, Patient being monitored, Emergency Drugs available, Timeout performed and Suction available Oxygen Delivery Method: Non-rebreather mask Preoxygenation: Pre-oxygenation with 100% oxygen Induction Type: Rapid sequence Ventilation: Mask ventilation without difficulty Laryngoscope Size: Mac and 4 Tube size: 8.0 mm Number of attempts: 1 Airway Equipment and Method: Video-laryngoscopy and Rigid stylet Placement Confirmation: ETT inserted through vocal cords under direct vision,  CO2 detector and Breath sounds checked- equal and bilateral Secured at: 24 cm Tube secured with: ETT holder Dental Injury: Teeth and Oropharynx as per pre-operative assessment     .Critical Care Performed by: Abigail Butts, PA-C Authorized by: Abigail Butts, PA-C   Critical care provider statement:    Critical care time (minutes):  60   Critical care time was exclusive of:  Separately billable procedures and treating other patients and teaching time   Critical care was necessary to treat or prevent imminent or life-threatening deterioration of the following conditions:  Respiratory failure, circulatory failure and metabolic crisis   Critical care was time spent personally by me on the following activities:  Development of treatment  plan with patient or surrogate, discussions with consultants, evaluation of patient's response to treatment, examination of patient, obtaining history from patient or surrogate, ordering and  performing treatments and interventions, ordering and review of laboratory studies, ordering and review of radiographic studies, pulse oximetry, re-evaluation of patient's condition and review of old charts   I assumed direction of critical care for this patient from another provider in my specialty: no     (including critical care time)  Medications Ordered in ED Medications  vancomycin (VANCOCIN) 2,000 mg in sodium chloride 0.9 % 500 mL IVPB (2,000 mg Intravenous New Bag/Given 12/07/2017 0545)  propofol (DIPRIVAN) 1000 MG/100ML infusion (25 mcg/kg/min  125 kg Intravenous Rate/Dose Change 11/13/2017 0615)  dextrose 10 % infusion ( Intravenous New Bag/Given 12/04/2017 0616)  dextrose 50 % solution 50 mL (50 mLs Intravenous Given 11/18/2017 0402)  albuterol (PROVENTIL) (2.5 MG/3ML) 0.083% nebulizer solution 5 mg (5 mg Nebulization Given 11/11/2017 0612)  ipratropium (ATROVENT) nebulizer solution 0.5 mg (0.5 mg Nebulization Given 11/28/2017 0613)  furosemide (LASIX) injection 40 mg (40 mg Intravenous Given 12/03/2017 0521)  piperacillin-tazobactam (ZOSYN) IVPB 3.375 g (0 g Intravenous Stopped 11/12/2017 0559)  sodium chloride 0.9 % bolus 1,000 mL (0 mLs Intravenous Hold 11/14/2017 0548)  etomidate (AMIDATE) injection 30 mg (30 mg Intravenous Given 11/15/2017 0532)  succinylcholine (ANECTINE) injection 200 mg (200 mg Intravenous Given 11/15/2017 0532)  dextrose 50 % solution 50 mL (50 mLs Intravenous Given 12/07/2017 0525)     Initial Impression / Assessment and Plan / ED Course  I have reviewed the triage vital signs and the nursing notes.  Pertinent labs & imaging results that were available during my care of the patient were reviewed by me and considered in my medical decision making (see chart for details).  Clinical Course as of Nov 21 724  Tue Nov 21, 2017  6440 Echo 07/07/17: Study Conclusions  - Left ventricle: The cavity size was moderately dilated. Wall   thickness was increased in a pattern of moderate LVH.  Systolic   function was moderately to severely reduced. The estimated   ejection fraction was in the range of 30% to 35%. Moderate   diffuse hypokinesis with distinct regional wall motion   abnormalities. Akinesis of the inferolateral myocardium. Features   are consistent with a pseudonormal left ventricular filling   pattern, with concomitant abnormal relaxation and increased   filling pressure (grade 2 diastolic dysfunction). Elevated left   atrial pressure. - Mitral valve: There was mild to moderate regurgitation. - Left atrium: The atrium was moderately dilated. - Right atrium: The atrium was mildly dilated. - Tricuspid valve: There was mild-moderate regurgitation. - Pulmonary arteries: PA peak pressure: 34 mm Hg (S).  [HM]  [phone] Discussed with Dr. Janann Colonel who will admit to PCCM.  Discussed 32m/kg bolus and do no feel this is sepsis at this time.  He is in agreement to hold fluids at this time.  [HM]    Clinical Course User Index [HM] Raphaela Cannaday, HJarrett Soho PA-C    Patient presents to the emergency department altered, hypoglycemic and in respiratory distress.  He has a clinical picture of congestive heart failure and fluid overload with significant peripheral edema and anasarca.  Troponin is elevated at 0.25.  Significant elevation in lactic acid at 11.99.  Patient is afebrile and not hypotensive.  No source of infection.  Doubt this is sepsis.  Patient also with acute kidney injury with creatinine of 3.0 up from 1.5.  Broad-spectrum antibiotics initiated for possible sepsis  however based on respiratory failure and evidence of fluid overload, will not give 30cc/kg bolus.  He was seen today for several.  Chest x-ray shows evidence of fluid overload.  I have personally viewed and interpreted these films.  Record review shows that last echocardiogram had ejection fraction of 30-35%.  Suspect that these are all from congestive heart failure.  Patient has required 2 doses of D50 to keep his blood  sugar elevated.  Patient does seem to have increased alertness after D50 is given but then become somnolent again.  D10 initiated.  Patient is critically ill and unable to maintain his airway.  Patient was intubated by myself and a central line was placed by Dr. Dina Rich.  Patient discussed with critical care who will admit.  7:23 AM At shift change, patient pending cardiology consultation (paged, but no return call) and CT head.  His pressures have remained stable with a systolic pressure in the low 100s.  PCCM is to evaluate the patient after shift change.    The patient was discussed with and seen by Dr. Dina Rich who agrees with the treatment plan.  7:58 AM Discussed with Dr. Einar Gip, cardiology,  who will evaluate this morning.    Final Clinical Impressions(s) / ED Diagnoses   Final diagnoses:  Hypoglycemia  Altered mental status, unspecified altered mental status type  Respiratory distress  Acute decompensated heart failure (HCC)  Elevated lactic acid level  NSTEMI (non-ST elevated myocardial infarction) [facility])    ED Discharge Orders    None       Agapito Games 11/12/2017 0272    Merryl Hacker, MD 12/06/2017 0758    Natisha Trzcinski, Jarrett Soho, PA-C 12/03/2017 5366    Merryl Hacker, MD 11/14/2017 [phone]

## 2017-11-21 NOTE — ED Notes (Addendum)
NS IV bolus infusing , Zosyn IV antibiotic infusing , EDP at bedside with RT intubating pt.

## 2017-11-21 NOTE — Progress Notes (Signed)
Pharmacy Antibiotic Note  Todd Sullivan is a 62 y.o. male admitted on 11/15/2017 with pneumonia.  Pharmacy has been consulted for Vancomycin and Zosyn dosing. He has already received a dose of vanc and Zosyn in the ED. WBC elevated at 14.4. LA 9.52. SCr 3.18 (BL ~ 1.4). nCrCl ~ 20-25 mL/min  Plan: -Vancomycin 1750 mg IV Q 48 hours -Zosyn 3.375 gm IV Q 8 hours  -Monitor CBC, PCT, renal fx, cultures and clinical progress -VT at SS  Height: 6\' 1"  (185.4 cm) Weight: 275 lb 9.2 oz (125 kg) IBW/kg (Calculated) : 79.9  Temp (24hrs), Avg:98.1 F (36.7 C), Min:97.9 F (36.6 C), Max:98.2 F (36.8 C)  Recent Labs  Lab 11/25/2017 0404 12/03/2017 0412 11/13/2017 0429 11/13/2017 0538  WBC 14.4*  --   --   --   CREATININE 3.07*  --  3.18*  --   LATICACIDVEN  --  11.99*  --  9.52*    Estimated Creatinine Clearance: 34.2 mL/min (A) (by C-G formula based on SCr of 3.18 mg/dL (H)).    Allergies  Allergen Reactions  . Banana     Sick   . Egg Yolk Nausea And Vomiting  . Kiwi Extract Nausea And Vomiting    Antimicrobials this admission: Vanc 2/12 >>  Zosyn 2/12 >>   Dose adjustments this admission: None  Microbiology results: 2/12 BCx:    Thank you for allowing pharmacy to be a part of this patient's care.   Vinnie Level, PharmD., BCPS Clinical Pharmacist Pager [phone]

## 2017-11-21 NOTE — ED Triage Notes (Signed)
Pt arrives from home via EMS with c/o SOB and hypoglycemic; Pt was placed on CPAP by request but has hx of COPD; pt has had recent MI x 3 months ago; Pt is altered on arrival and continues to request CPAP. EDP assessed pt and determined CPAP is not needed; pt at 97% RA; Pt had CBG of 48 with EMS but has decreased to 28 on arrival to ED. Pt responds to pain at this time- [facility]

## 2017-11-22 ENCOUNTER — Inpatient Hospital Stay (HOSPITAL_COMMUNITY): Payer: Medicare Other

## 2017-11-22 DIAGNOSIS — R4182 Altered mental status, unspecified: Secondary | ICD-10-CM

## 2017-11-22 LAB — GLUCOSE, CAPILLARY
GLUCOSE-CAPILLARY: 107 mg/dL — AB (ref 65–99)
GLUCOSE-CAPILLARY: 111 mg/dL — AB (ref 65–99)
GLUCOSE-CAPILLARY: 117 mg/dL — AB (ref 65–99)
Glucose-Capillary: 112 mg/dL — ABNORMAL HIGH (ref 65–99)
Glucose-Capillary: 129 mg/dL — ABNORMAL HIGH (ref 65–99)
Glucose-Capillary: 91 mg/dL (ref 65–99)
Glucose-Capillary: 109 mg/dL — ABNORMAL HIGH (ref 65–99)
Glucose-Capillary: 115 mg/dL — ABNORMAL HIGH (ref 65–99)

## 2017-11-22 LAB — TROPONIN I: TROPONIN I: 0.07 ng/mL — AB (ref <0.03)

## 2017-11-22 LAB — BASIC METABOLIC PANEL
Anion gap: 18 — ABNORMAL HIGH (ref 5–15)
BUN: 65 mg/dL — AB (ref 6–20)
CHLORIDE: 92 mmol/L — AB (ref 101–111)
CO2: 24 mmol/L (ref 22–32)
CREATININE: 3.01 mg/dL — AB (ref 0.61–1.24)
Calcium: 8.8 mg/dL — ABNORMAL LOW (ref 8.9–10.3)
GFR calc Af Amer: 24 mL/min — ABNORMAL LOW (ref >60)
GFR, EST NON AFRICAN AMERICAN: 21 mL/min — AB (ref >60)
Glucose, Bld: 118 mg/dL — ABNORMAL HIGH (ref 65–99)
Potassium: 3.7 mmol/L (ref 3.5–5.1)
SODIUM: 134 mmol/L — AB (ref 135–145)

## 2017-11-22 LAB — CBC
HCT: 37.5 % — ABNORMAL LOW (ref 39.0–52.0)
Hemoglobin: 12.5 g/dL — ABNORMAL LOW (ref 13.0–17.0)
MCH: 25.4 pg — AB (ref 26.0–34.0)
MCHC: 33.3 g/dL (ref 30.0–36.0)
MCV: 76.2 fL — ABNORMAL LOW (ref 78.0–100.0)
PLATELETS: 88 10*3/uL — AB (ref 150–400)
RBC: 4.92 MIL/uL (ref 4.22–5.81)
RDW: 18.3 % — AB (ref 11.5–15.5)
WBC: 15 10*3/uL — ABNORMAL HIGH (ref 4.0–10.5)

## 2017-11-22 LAB — MAGNESIUM: MAGNESIUM: 2 mg/dL (ref 1.7–2.4)

## 2017-11-22 LAB — PHOSPHORUS: Phosphorus: 3.7 mg/dL (ref 2.5–4.6)

## 2017-11-22 LAB — POCT I-STAT 3, ART BLOOD GAS (G3+)
ACID-BASE EXCESS: 4 mmol/L — AB (ref 0.0–2.0)
Bicarbonate: 28.4 mmol/L — ABNORMAL HIGH (ref 20.0–28.0)
O2 Saturation: 100 %
PCO2 ART: 40.3 mmHg (ref 32.0–48.0)
PH ART: 7.454 — AB (ref 7.350–7.450)
Patient temperature: 98
TCO2: 30 mmol/L (ref 22–32)
pO2, Arterial: 172 mmHg — ABNORMAL HIGH (ref 83.0–108.0)

## 2017-11-22 LAB — HEPATIC FUNCTION PANEL
ALT: 1173 U/L — ABNORMAL HIGH (ref 17–63)
AST: 1769 U/L — AB (ref 15–41)
Albumin: 2.9 g/dL — ABNORMAL LOW (ref 3.5–5.0)
Alkaline Phosphatase: 167 U/L — ABNORMAL HIGH (ref 38–126)
BILIRUBIN DIRECT: 3 mg/dL — AB (ref 0.1–0.5)
BILIRUBIN TOTAL: 5.3 mg/dL — AB (ref 0.3–1.2)
Indirect Bilirubin: 2.3 mg/dL — ABNORMAL HIGH (ref 0.3–0.9)
Total Protein: 5.9 g/dL — ABNORMAL LOW (ref 6.5–8.1)

## 2017-11-22 LAB — LACTIC ACID, PLASMA
LACTIC ACID, VENOUS: 1.9 mmol/L (ref 0.5–1.9)
LACTIC ACID, VENOUS: 1.9 mmol/L (ref 0.5–1.9)

## 2017-11-22 LAB — T4: T4, Total: 4.9 ug/dL (ref 4.5–12.0)

## 2017-11-22 LAB — URINE CULTURE: CULTURE: NO GROWTH

## 2017-11-22 LAB — STREP PNEUMONIAE URINARY ANTIGEN: Strep Pneumo Urinary Antigen: POSITIVE — AB

## 2017-11-22 MED ORDER — VITAL HIGH PROTEIN PO LIQD
1000.0000 mL | ORAL | Status: DC
  Administered 2017-11-22 – 2017-12-03 (×11): 1000 mL
  Filled 2017-11-22 (×4): qty 1000

## 2017-11-22 MED ORDER — SODIUM CHLORIDE 0.9 % IV BOLUS (SEPSIS)
500.0000 mL | Freq: Once | INTRAVENOUS | Status: AC
  Administered 2017-11-22: 500 mL via INTRAVENOUS

## 2017-11-22 MED ORDER — LEVOFLOXACIN IN D5W 750 MG/150ML IV SOLN
750.0000 mg | INTRAVENOUS | Status: DC
  Administered 2017-11-22: 750 mg via INTRAVENOUS
  Filled 2017-11-22: qty 150

## 2017-11-22 MED ORDER — PRO-STAT SUGAR FREE PO LIQD
30.0000 mL | Freq: Every day | ORAL | Status: DC
  Administered 2017-11-22 – 2017-12-04 (×60): 30 mL
  Filled 2017-11-22 (×68): qty 30

## 2017-11-22 NOTE — Progress Notes (Signed)
PULMONARY / CRITICAL CARE MEDICINE   Name: Todd Sullivan MRN: [id number] DOB: 05-17-1956    ADMISSION DATE:  11-29-2017 CONSULTATION DATE:  11-29-2017  REFERRING MD: Dr. Wilkie Aye  CHIEF COMPLAINT:  SOB  HISTORY OF PRESENT ILLNESS:   HPI obtained from medical chart review as patient is currently intubated and sedated on mechanical ventilation.  62 year old male with past medical history significant for COPD, asthma, diabetes, CAD status post STEMI/ stent 06/2017, hypertension, and CVA who presented via EMS with complaints of shortness of breath and altered mental status. His significant other reports she has not done well since he MI 3 months ago.  Also reports he has not taken his medications for the last 5 days.  He has not been out of bed for the last 5 days.  Found him after he fell out of bed called 911 and was transported to [facility].  On arrival to the emergency room he was unable to protect his airway and was intubated.  He is to have a CT of his head to rule out CVA and/or trauma since he has had a history of CVA in the past.  On my arrival in the emergency room to trauma a he was sedated with propofol and going to CT scan for evaluation.  Neuro exam was difficult due to high level of sedation.  Difficult due to high level sedation  Note he had profound hypoglycemia otherwise not on insulin but he is on metformin.  He had to be started on dextrose 10% drip to maintain his glucose levels greater than 90.  Labs noted for BUN 53, sCr 3.18 (baseline 1.5-1.6), lactic acid 11.99 -> 9.52, BNP 1886, AST 1073, ALT 804, t. Bili 5.4, WBC 14.4, Plts 107, TSH 0.888, initial CXR showing pulmonary edema   We will admit to the intensive care unit continue to explore laboratory data evaluate CT scan of the head.  Treat underlying lactic acidosis, evaluate for infectious process and cardiology is to evaluate.  PAST MEDICAL HISTORY :  He  has a past medical history of Anxiety, Asthma, CHF  (congestive heart failure) (HCC), COPD (chronic obstructive pulmonary disease) (HCC), Depression, Diabetes mellitus, Gout, Headache(784.0), Hypertension, Shortness of breath, and Stroke (HCC).  PAST SURGICAL HISTORY: He  has a past surgical history that includes Eye surgery (62 years old); LEFT HEART CATH AND CORONARY ANGIOGRAPHY (N/A, 07/06/2017); and Coronary/Graft Acute MI Revascularization (N/A, 07/06/2017).  Allergies  Allergen Reactions  . Banana     Sick   . Egg Yolk Nausea And Vomiting  . Kiwi Extract Nausea And Vomiting    No current facility-administered medications on file prior to encounter.    Current Outpatient Medications on File Prior to Encounter  Medication Sig  . albuterol (PROVENTIL HFA;VENTOLIN HFA) 108 (90 BASE) MCG/ACT inhaler Inhale 2 puffs into the lungs every 4 (four) hours as needed. For shortness of breath.  Marland Kitchen atorvastatin (LIPITOR) 80 MG tablet Take 1 tablet (80 mg total) by mouth daily at 6 PM.  . budesonide-formoterol (SYMBICORT) 160-4.5 MCG/ACT inhaler Inhale 2 puffs into the lungs 2 (two) times daily.  . colchicine 0.6 MG tablet Take 0.6 mg by mouth at bedtime.   . cyclobenzaprine (FLEXERIL) 10 MG tablet Take 10 mg by mouth 2 (two) times daily.  Marland Kitchen diltiazem (TIAZAC) 180 MG 24 hr capsule Take 360 mg by mouth at bedtime.  . furosemide (LASIX) 40 MG tablet Take 1 tablet (40 mg total) by mouth as needed. For leg swelling and shortness of  breath  . isosorbide-hydrALAZINE (BIDIL) 20-37.5 MG tablet Take 1 tablet by mouth 3 (three) times daily.  . metFORMIN (GLUCOPHAGE) 500 MG tablet Take 500 mg by mouth 2 (two) times daily with a meal.  . ticagrelor (BRILINTA) 90 MG TABS tablet Take 1 tablet (90 mg total) by mouth 2 (two) times daily.  . carvedilol (COREG) 25 MG tablet Take 1 tablet (25 mg total) by mouth 2 (two) times daily with a meal. (Patient not taking: Reported on 12/07/2017)  . nicotine (NICODERM CQ - DOSED IN MG/24 HR) 7 mg/24hr patch Place 1 patch (7 mg  total) onto the skin daily. (Patient not taking: Reported on 11/16/2017)    FAMILY HISTORY:  His indicated that the status of his mother is unknown. He indicated that the status of his sister is unknown. He indicated that the status of his brother is unknown. He indicated that the status of his son is unknown.   SOCIAL HISTORY: He  reports that he has been smoking cigarettes.  He has a 20.00 pack-year smoking history. he has never used smokeless tobacco. He reports that he does not drink alcohol or use drugs.  REVIEW OF SYSTEMS:   Not available  SUBJECTIVE:  Morbidly obese male sedated and intubated and on ventilator.  VITAL SIGNS: BP 108/80   Pulse 96   Temp 97.9 F (36.6 C) (Oral)   Resp 16   Ht 6\' 3"  (1.905 m)   Wt (!) 312 lb 13.3 oz (141.9 kg)   SpO2 98%   BMI 39.10 kg/m   HEMODYNAMICS: CVP:  [16 mmHg-22 mmHg] 16 mmHg  VENTILATOR SETTINGS: Vent Mode: PRVC FiO2 (%):  [40 %-60 %] 40 % Set Rate:  [16 bmp] 16 bmp Vt Set:  [630 mL] 630 mL PEEP:  [2 cmH20-5 cmH20] 5 cmH20 Plateau Pressure:  [21 cmH20-24 cmH20] 21 cmH20  INTAKE / OUTPUT: I/O last 3 completed shifts: In: 2104.8 [I.V.:854.8; NG/GT:400; IV Piggyback:850] Out: 700 [Urine:450; Emesis/NG output:250]  PHYSICAL EXAMINATION: General: Morbidly obese male who sedated with propofol and orotracheally intubated Neuro:.  Currently heavily sedated HEENT: Right IJ CVL is noted, endotracheal tube connected to ventilator.  Short neck.  Pupils equal reactive light. Cardiovascular: Heart sounds are distant Lungs: Coarse rhonchi bilaterally decreased in the bases Abdomen: Obese soft nontender Musculoskeletal: Intact Skin: Lower extremities with 3+ edema.  Noted to have venous stasis changes.  LABS:  BMET Recent Labs  Lab 11/11/2017 0429 11/27/2017 0823 11/22/17 0430  NA 135 135 134*  K 4.9 4.1 3.7  CL 92* 95* 92*  CO2 13* 18* 24  BUN 53* 57* 65*  CREATININE 3.18* 2.87* 3.01*  GLUCOSE 28* 146* 118*     Electrolytes Recent Labs  Lab 12/07/2017 0429 11/20/2017 0823 11/22/17 0430  CALCIUM 9.5 8.9 8.8*  MG  --  1.8 2.0  PHOS  --  5.4* 3.7    CBC Recent Labs  Lab 11/10/2017 0404 11/22/2017 0823 11/22/17 0430  WBC 14.4* 16.3* 15.0*  HGB 13.1 12.1* 12.5*  HCT 41.0 37.7* 37.5*  PLT 107* 96* 88*    Coag's Recent Labs  Lab 11/10/2017 0823  APTT 37*  INR 2.51    Sepsis Markers Recent Labs  Lab 11/30/2017 0538 11/25/2017 0823 11/18/2017 1053  LATICACIDVEN 9.52* 5.8* 3.9*  PROCALCITON  --  1.85  --     ABG Recent Labs  Lab 11/14/2017 0520 12/07/2017 0849 11/22/17 0344  PHART 7.273* 7.332* 7.454*  PCO2ART 46.3 42.6 40.3  PO2ART 216.0* 70.0* 172.0*  Liver Enzymes Recent Labs  Lab 11/25/2017 0429 12/05/2017 0823 11/22/17 0430  AST 1,073* 1,458* 1,769*  ALT 804* 931* 1,173*  ALKPHOS 181* 171* 167*  BILITOT 5.4* 5.9* 5.3*  ALBUMIN 3.3* 3.1* 2.9*    Cardiac Enzymes Recent Labs  Lab 11/17/2017 0823 11/13/2017 1445 11/22/17 0451  TROPONINI 0.05* 0.06* 0.07*    Glucose Recent Labs  Lab 12/06/2017 0712 11/30/2017 0749 11/15/2017 0901 12/01/2017 1134 11/27/2017 2350 11/22/17 0339  GLUCAP 124* 141* 138* 146* 107* 111*    Imaging Ct Head Wo Contrast  Result Date: 11/23/2017 CLINICAL DATA:  Altered mental status, unexplained altered level of consciousness, history stroke, hypertension, COPD, CHF, asthma EXAM: CT HEAD WITHOUT CONTRAST TECHNIQUE: Contiguous axial images were obtained from the base of the skull through the vertex without intravenous contrast. Sagittal and coronal MPR images reconstructed from axial data set. COMPARISON:  None FINDINGS: Brain: Normal ventricular morphology. No midline shift or mass effect. Normal appearance of brain parenchyma. No intracranial hemorrhage, mass lesion or evidence of acute infarction. No extra-axial fluid collections. Vascular: Atherosclerotic calcification of internal carotid arteries at skull base Skull: Intact Sinuses/Orbits: Clear  Other: Endotracheal and nasogastric tubes noted at oral cavity IMPRESSION: No acute intracranial abnormalities. Electronically Signed   By: Ulyses Southward M.D.   On: 11/20/2017 08:19   Dg Chest Port 1 View  Result Date: 11/22/2017 CLINICAL DATA:  Intubated. EXAM: PORTABLE CHEST 1 VIEW COMPARISON:  12/02/2017 FINDINGS: Endotracheal tube terminates 5 cm above the carina. Enteric tube courses into the stomach with tip not imaged. Right jugular catheter tip is suboptimally visualized but is likely in the region of the cavoatrial junction. Dense left lung base opacity has mildly worsened, and air bronchograms are noted. An underlying left pleural effusion is possible. The right lung remains grossly clear. No pneumothorax is identified. IMPRESSION: Mild worsening of left lower lobe consolidation concerning for pneumonia. Electronically Signed   By: Sebastian Ache M.D.   On: 11/22/2017 06:53   Dg Abd Portable 1v  Result Date: 11/15/2017 CLINICAL DATA:  OG tube placement EXAM: PORTABLE ABDOMEN - 1 VIEW COMPARISON:  None. FINDINGS: OG tube tip is in the distal stomach. IMPRESSION: OG tube tip in the distal stomach. Electronically Signed   By: Charlett Nose M.D.   On: 12/04/2017 19:07     STUDIES:  11/13/2017 CT of the head>>  CULTURES: 12/02/2017 blood cultures x2>> 11/23/2017 sputum culture>> 11/19/2017 urine culture>> 12/03/2017 pro-calcitonin>>1.85 11/18/2017 respiratory virus panel>>Negative   ANTIBIOTICS: 12/04/2017 Vancomycin>> 11/14/2017 Zosyn>>  SIGNIFICANT EVENTS: 11/27/2017 intubated for respiratory failure  LINES/TUBES: 11/20/2017 endotracheal tube>> 12/03/2017 right internal jugular CVL>>  DISCUSSION: 62 year old morbidly obese male who modality has been smoking recently states he is on NicoDerm patches.  He is noted to be and have been declining over the past 3 months following cardiac cath MI and stent placement.  He has not taken his medication for the last 5 days nor has he been out of bed for  the last 5 days.  Significant other noted that he got up and fell during the night EMS was activated his transport to the emergency room he was unable to protect his airway and was intubated.  ASSESSMENT / PLAN:  PULMONARY A: Vent dependent respiratory failure secondary to inability to protect airway. CXR shows LLL consolidation concerning for pneumonia. PCT mildly elevated. Patient also presented with elevated BNP and significant LE edema consistent with CHF picture, likely contributing in SOB. P:   Vent bundle Bronchodilators as needed Chest x-ray post  intubation Full vent support Adjust vent for ABG  CARDIOVASCULAR A:  Coronary artery disease history with MI 3 months ago (9/18) primary PCI to dominant left circumflex and PDA with synergy drug-eluting stent. Elevated BNP in the setting of medication non adherence with LE edem consistent with CHF exacerbation. Weight has significantly improved. Last 2D echo revealed EF of 30% left ventricular hypokinesis. Poor diuresis overnight despite lasix.  P:  Follow up on cardiology consult, appreciate recs  Follow up on Echo Increase lasix 80 mg IV tid Continue ASA and brilinta  RENAL Lab Results  Component Value Date   CREATININE 3.01 (H) 11/22/2017   CREATININE 2.87 (H) 11/28/2017   CREATININE 3.18 (H) 2017-11-28   Recent Labs  Lab Nov 28, 2017 0429 11/28/17 0823 11/22/17 0430  NA 135 135 134*   Recent Labs  Lab 11/28/2017 0429 2017/11/28 0823 11/22/17 0430  K 4.9 4.1 3.7   A:   Acute renal failure with last known creatinine 1.87. On admission creat  3.18. Overnight improved to 3.01 while on lasix. AKI likely secondary to sepsis as well as decompensated CHF. CK 1,926 consitent with mild rhabo which coule also be contributing to worsening kidney function. Expect improvement with better volume control.  P:   IVF as needed  Serial monitoring of creatinine  GASTROINTESTINAL A:   Morbid obesity GI protection P:   PPI Orogastric  tube, if remains intubated greater than 24 hours for pursue tube feedings Consult nutrition for TF as per nutrition  HEMATOLOGIC Recent Labs    Nov 28, 2017 0823 11/22/17 0430  HGB 12.1* 12.5*   A:   No acute issues. DVT prophylaxis No overt need for anticoagulation at this time. P:  SCDs in place  INFECTIOUS A:   Elevated lactic acid and WBC on admission. Improved overnight and continue to trend down. WBC slightly worse, currently on Vanc/Zosyn with blood cx pending. CXR showing LLL consolidation likely PNA. RVP and Flu are negative.  P:   Continue Vanc/Zosyn Order Urine strep and legionella F/u on blood, tracheal and urine cultures   ENDOCRINE CBG (last 3)  Recent Labs    2017/11/28 1134 November 28, 2017 2350 11/22/17 0339  GLUCAP 146* 107* 111*    A:   T2DM, A1c 6.5. Initially hypoglycemic requiring D10. Patient is only on metformin. Hypoglycemia likely secondary to poor po intake in the past few days and infectious process.  P:   Hold all diabetic medications Monitor CBG every 4 hours  NEUROLOGIC A:   Altered mental status, likely multifactorial in the setting of sepsis and hypoglycemia. Head CT negative for any acute intracranial abnormalities.  P:   RASS goal: -1 CT of the head rule out trauma   FAMILY  - Updates: No family at bedside 11/28/2017  - Inter-disciplinary family meet or Palliative Care meeting due by:  day 7   Lovena Neighbours, MD [facility], PGY-2  11/22/2017, 8:05 AM

## 2017-11-22 NOTE — Progress Notes (Signed)
Subjective:  Unable to obtain. Patient intubated and sedated.  Thick yellow secretions through ETT, per RT Respiratory panel negative.  Blood cultures pending.  Objective:  Vital Signs in the last 24 hours: Temp:  [97.9 F (36.6 C)-99 F (37.2 C)] 98.2 F (36.8 C) (02/13 1115) Pulse Rate:  [81-97] 96 (02/13 1000) Resp:  [14-27] 16 (02/13 1000) BP: (91-124)/(51-104) 110/81 (02/13 1000) SpO2:  [94 %-100 %] 97 % (02/13 1000) FiO2 (%):  [30 %-60 %] 30 % (02/13 0854) Weight:  [140.9 kg (310 lb 10.1 oz)-141.9 kg (312 lb 13.3 oz)] 141.9 kg (312 lb 13.3 oz) (02/13 0500)  Intake/Output from previous day: 02/12 0701 - 02/13 0700 In: 2054.8 [I.V.:854.8; NG/GT:400; IV Piggyback:800] Out: 700 [Urine:450; Emesis/NG output:250] Intake/Output from this shift: Total I/O In: 165 [I.V.:85; NG/GT:80] Out: 300 [Urine:300]  Physical Exam: Nursing note and vitals reviewed. Constitutional: He appears well-developed.  Morbidly obese. Intubated, sedated   HENT:  Head: Normocephalic.  Eyes: Scleral icterus is present.  Neck: Normal range of motion. Neck supple. JVD present.  Cardiovascular: Normal rate and regular rhythm.  Murmur (II/VI holosystolic murmur RLSB) heard. Distant heart sounds.   Respiratory: Effort normal.  B/l coarse breath sounds   GI: Soft. Bowel sounds are normal. He exhibits distension.  Musculoskeletal: Normal range of motion. He exhibits edema (2+ b/l).  Lymphadenopathy:    He has no cervical adenopathy.  Neurological:  Intubated, sedated   Skin: Skin is warm and dry.  Psychiatric:  Not assessed     Lab Results: Recent Labs    11/17/2017 0823 11/22/17 0430  WBC 16.3* 15.0*  HGB 12.1* 12.5*  PLT 96* 88*   Recent Labs    11/27/2017 0823 11/22/17 0430  NA 135 134*  K 4.1 3.7  CL 95* 92*  CO2 18* 24  GLUCOSE 146* 118*  BUN 57* 65*  CREATININE 2.87* 3.01*   Recent Labs    11/30/2017 1445 11/22/17 0451  TROPONINI 0.06* 0.07*   Hepatic Function  Panel Recent Labs    11/22/17 0430  PROT 5.9*  ALBUMIN 2.9*  AST 1,769*  ALT 1,173*  ALKPHOS 167*  BILITOT 5.3*  BILIDIR 3.0*  IBILI 2.3*   Echocardiogram pending  Assessment: Acute Respiratory failure Sepsis, likely due to pneumonia, respiratory panel pending Lactic acid elevation Elevated liver enzymes Mild troponin elevation: Type 2 MI Acute on chronic diastolic heart failure CAD status post  primary PCI LCx PDA Synergy DES 2.5 X 16 mm 07/09/2017 AKI/CKD Hypertension Type 2 DM: well controlled Hyperlipidemia Morbid obesity Tobacco abuse   Recommendations:  Agree with current management of respiratory failure per critical care team. Continue IV Lasix 40 mg every 6 hours. Recommend echocardiogram Continue aspirin 81 mg, Brilinta 90 mg twice a day. S/p primary PCI LCx PDA Synergy DES 2.5 X 16 mm ASA 81 mg lifelong, brilinta 90 mg bid for at least 1 year Baseline home medications include Coreg 25 mg bid, spironolactone 25 mg daily, Bidil 20-37.5 mg tid. Could resume as allowed by his hemodynamic status and renal funcion in the setting of sepsis. Starting with Bidil.      LOS: 1 day    Todd J Patwardhan 11/22/2017, 11:48 AM  Todd Emiliano Dyer, MD [facility] [place] Pager: [phone] Office: [phone] If no answer Cell [phone]

## 2017-11-22 NOTE — Progress Notes (Signed)
Initial Nutrition Assessment  DOCUMENTATION CODES:   Obesity unspecified  INTERVENTION:    Vital High Protein at goal rate of 50 ml/h (1200 ml per day) and Prostat 30 ml 5 times daily  TF + Propofol provides 1998 kcals, 180 gm protein, 1003 ml free water daily  NUTRITION DIAGNOSIS:   Inadequate oral intake related to inability to eat as evidenced by NPO status  GOAL:   Provide needs based on ASPEN/SCCM guidelines  MONITOR:   TF tolerance, Vent status, Labs, Skin, Weight trends, I & O's  REASON FOR ASSESSMENT:   Consult Enteral/tube feeding initiation and management  ASSESSMENT:   62 yo Male with PMH significant for COPD, asthma, diabetes, CAD status post STEMI/ stent 06/2017, hypertension, and CVA who presented via EMS with complaints of shortness of breath and altered mental status.  Patient is currently intubated on ventilator support Temp (24hrs), Avg:98.4 F (36.9 C), Min:97.9 F (36.6 C), Max:99 F (37.2 C)  Propofol: 11.3 ml/hr >> 298 fat kcals  RD unable to obtain nutrition hx. No family at bedside. Vital High Protein started 2/12. Currently infusing at 40 ml/hr via OGT. Pt found to be severely hypoglycemic with blood sugar in 20's.  CT of head showed no acute intracranial abnormalities.  Labs and medications reviewed. Na 134 (L). CBG's 107-111-112.  NUTRITION - FOCUSED PHYSICAL EXAM:    Most Recent Value  Orbital Region  No depletion  Upper Arm Region  No depletion  Thoracic and Lumbar Region  No depletion  Buccal Region  No depletion  Temple Region  No depletion  Clavicle Bone Region  No depletion  Clavicle and Acromion Bone Region  No depletion  Scapular Bone Region  No depletion  Dorsal Hand  No depletion  Patellar Region  No depletion  Anterior Thigh Region  No depletion  Posterior Calf Region  No depletion  Edema (RD Assessment)  None     Diet Order:  Diet NPO time specified  EDUCATION NEEDS:   Not appropriate for education at this  time  Skin:  Skin Assessment: Reviewed RN Assessment  Last BM:  2/12   Intake/Output Summary (Last 24 hours) at 11/22/2017 1245 Last data filed at 11/22/2017 1200 Gross per 24 hour  Intake 1910 ml  Output 1275 ml  Net 635 ml   Height:   Ht Readings from Last 1 Encounters:  December 14, 2017 6\' 3"  (1.905 m)   Weight:   Wt Readings from Last 1 Encounters:  11/22/17 (!) 312 lb 13.3 oz (141.9 kg)   Ideal Body Weight:  89 kg  BMI:  Body mass index is 39.1 kg/m.  Estimated Nutritional Needs:   Kcal:  0354-6568  Protein:  >/= 178 gm  Fluid:  per MD  Maureen Chatters, RD, LDN Pager #: [phone] After-Hours Pager #: [phone]

## 2017-11-23 ENCOUNTER — Inpatient Hospital Stay (HOSPITAL_COMMUNITY): Payer: Medicare Other

## 2017-11-23 ENCOUNTER — Other Ambulatory Visit (HOSPITAL_COMMUNITY): Payer: Medicare Other

## 2017-11-23 LAB — ECHOCARDIOGRAM COMPLETE
HEIGHTINCHES: 75 in
WEIGHTICAEL: 4998.27 [oz_av]

## 2017-11-23 LAB — COMPREHENSIVE METABOLIC PANEL
ALT: 947 U/L — AB (ref 17–63)
ANION GAP: 14 (ref 5–15)
AST: 993 U/L — ABNORMAL HIGH (ref 15–41)
Albumin: 2.5 g/dL — ABNORMAL LOW (ref 3.5–5.0)
Alkaline Phosphatase: 166 U/L — ABNORMAL HIGH (ref 38–126)
BUN: 65 mg/dL — ABNORMAL HIGH (ref 6–20)
CHLORIDE: 96 mmol/L — AB (ref 101–111)
CO2: 27 mmol/L (ref 22–32)
CREATININE: 2.44 mg/dL — AB (ref 0.61–1.24)
Calcium: 8.4 mg/dL — ABNORMAL LOW (ref 8.9–10.3)
GFR, EST AFRICAN AMERICAN: 31 mL/min — AB (ref >60)
GFR, EST NON AFRICAN AMERICAN: 27 mL/min — AB (ref >60)
Glucose, Bld: 130 mg/dL — ABNORMAL HIGH (ref 65–99)
POTASSIUM: 3.2 mmol/L — AB (ref 3.5–5.1)
Sodium: 137 mmol/L (ref 135–145)
Total Bilirubin: 4.7 mg/dL — ABNORMAL HIGH (ref 0.3–1.2)
Total Protein: 5.8 g/dL — ABNORMAL LOW (ref 6.5–8.1)

## 2017-11-23 LAB — GLUCOSE, CAPILLARY
GLUCOSE-CAPILLARY: 126 mg/dL — AB (ref 65–99)
GLUCOSE-CAPILLARY: 117 mg/dL — AB (ref 65–99)
GLUCOSE-CAPILLARY: 101 mg/dL — AB (ref 65–99)
Glucose-Capillary: 130 mg/dL — ABNORMAL HIGH (ref 65–99)
Glucose-Capillary: 120 mg/dL — ABNORMAL HIGH (ref 65–99)

## 2017-11-23 LAB — CBC
HCT: 37.4 % — ABNORMAL LOW (ref 39.0–52.0)
Hemoglobin: 12.5 g/dL — ABNORMAL LOW (ref 13.0–17.0)
MCH: 25.2 pg — ABNORMAL LOW (ref 26.0–34.0)
MCHC: 33.4 g/dL (ref 30.0–36.0)
MCV: 75.3 fL — AB (ref 78.0–100.0)
Platelets: 86 10*3/uL — ABNORMAL LOW (ref 150–400)
RBC: 4.97 MIL/uL (ref 4.22–5.81)
RDW: 18.7 % — ABNORMAL HIGH (ref 11.5–15.5)
WBC: 10.3 10*3/uL (ref 4.0–10.5)

## 2017-11-23 LAB — LEGIONELLA PNEUMOPHILA SEROGP 1 UR AG: L. pneumophila Serogp 1 Ur Ag: NEGATIVE

## 2017-11-23 LAB — BRAIN NATRIURETIC PEPTIDE: B NATRIURETIC PEPTIDE 5: 2921 pg/mL — AB (ref 0.0–100.0)

## 2017-11-23 LAB — CK: CK TOTAL: 381 U/L (ref 49–397)

## 2017-11-23 LAB — COOXEMETRY PANEL
Carboxyhemoglobin: 1.2 % (ref 0.5–1.5)
METHEMOGLOBIN: 1.3 % (ref 0.0–1.5)
O2 Saturation: 78.4 %
Total hemoglobin: 13.2 g/dL (ref 12.0–16.0)

## 2017-11-23 MED ORDER — ASPIRIN 81 MG PO CHEW
81.0000 mg | CHEWABLE_TABLET | Freq: Every day | ORAL | Status: DC
  Administered 2017-11-23: 81 mg
  Filled 2017-11-23: qty 1

## 2017-11-23 MED ORDER — CEFTRIAXONE SODIUM 2 G IJ SOLR
2.0000 g | INTRAMUSCULAR | Status: AC
  Administered 2017-11-23 – 2017-11-27 (×5): 2 g via INTRAVENOUS
  Filled 2017-11-23 (×6): qty 20

## 2017-11-23 MED ORDER — POTASSIUM CHLORIDE 20 MEQ/15ML (10%) PO SOLN
40.0000 meq | ORAL | Status: AC
  Administered 2017-11-23 (×3): 40 meq via ORAL
  Filled 2017-11-23 (×3): qty 30

## 2017-11-23 MED ORDER — TICAGRELOR 90 MG PO TABS
90.0000 mg | ORAL_TABLET | Freq: Two times a day (BID) | ORAL | Status: DC
  Administered 2017-11-23: 90 mg
  Filled 2017-11-23 (×2): qty 1

## 2017-11-23 MED ORDER — FUROSEMIDE 10 MG/ML IJ SOLN: 40.0000 mg | INTRAMUSCULAR | Status: DC | PRN

## 2017-11-23 MED ORDER — MILRINONE LACTATE IN DEXTROSE 20-5 MG/100ML-% IV SOLN
0.1250 ug/kg/min | INTRAVENOUS | Status: DC
  Administered 2017-11-23 – 2017-11-27 (×10): 0.25 ug/kg/min via INTRAVENOUS
  Administered 2017-11-28: 0.125 ug/kg/min via INTRAVENOUS
  Administered 2017-11-28: 0.25 ug/kg/min via INTRAVENOUS
  Administered 2017-11-29: 0.125 ug/kg/min via INTRAVENOUS
  Filled 2017-11-23 (×14): qty 100

## 2017-11-23 MED ORDER — HEPARIN (PORCINE) IN NACL 100-0.45 UNIT/ML-% IJ SOLN
2100.0000 [IU]/h | INTRAMUSCULAR | Status: DC
  Administered 2017-11-23 – 2017-11-25 (×4): 2000 [IU]/h via INTRAVENOUS
  Administered 2017-11-26 – 2017-12-01 (×9): 1750 [IU]/h via INTRAVENOUS
  Administered 2017-12-02: 1600 [IU]/h via INTRAVENOUS
  Administered 2017-12-03 – 2017-12-04 (×2): 1400 [IU]/h via INTRAVENOUS
  Administered 2017-12-04 – 2017-12-07 (×3): 1700 [IU]/h via INTRAVENOUS
  Administered 2017-12-07 – 2017-12-08 (×2): 1950 [IU]/h via INTRAVENOUS
  Administered 2017-12-09 (×2): 2100 [IU]/h via INTRAVENOUS
  Filled 2017-11-23 (×51): qty 250

## 2017-11-23 MED ORDER — FUROSEMIDE 10 MG/ML IJ SOLN
40.0000 mg | Freq: Every day | INTRAMUSCULAR | Status: DC | PRN
  Administered 2017-11-24: 40 mg via INTRAVENOUS
  Filled 2017-11-23: qty 4

## 2017-11-23 MED ORDER — PERFLUTREN LIPID MICROSPHERE
1.0000 mL | INTRAVENOUS | Status: AC | PRN
  Administered 2017-11-23: 2 mL via INTRAVENOUS
  Filled 2017-11-23: qty 10

## 2017-11-23 MED ORDER — CLOPIDOGREL BISULFATE 75 MG PO TABS
75.0000 mg | ORAL_TABLET | Freq: Every day | ORAL | Status: DC
  Administered 2017-11-24 – 2017-11-26 (×3): 75 mg via ORAL
  Filled 2017-11-23 (×4): qty 1

## 2017-11-23 NOTE — Progress Notes (Signed)
ANTICOAGULATION CONSULT NOTE - Initial Consult  Pharmacy Consult for Heparin Indication: new apical thrombus  Allergies  Allergen Reactions  . Banana     Sick   . Egg Yolk Nausea And Vomiting  . Kiwi Extract Nausea And Vomiting    Patient Measurements: Height: 6' 3"  (190.5 cm) Weight: (!) 312 lb 6.3 oz (141.7 kg) IBW/kg (Calculated) : 84.5 Heparin Dosing Weight: 116 kg  Vital Signs: Temp: 98.2 F (36.8 C) (02/14 1532) Temp Source: Oral (02/14 1532) BP: 114/96 (02/14 1600) Pulse Rate: 105 (02/14 1600)  Labs: Recent Labs    11/30/2017 0439 12/04/2017 0823 11/17/2017 1445 11/22/17 0430 11/22/17 0451 11/23/17 0524  HGB  --  12.1*  --  12.5*  --  12.5*  HCT  --  37.7*  --  37.5*  --  37.4*  PLT  --  96*  --  88*  --  86*  APTT  --  37*  --   --   --   --   LABPROT  --  26.9*  --   --   --   --   INR  --  2.51  --   --   --   --   CREATININE  --  2.87*  --  3.01*  --  2.44*  CKTOTAL 1,886* 1,926*  --   --   --  381  CKMB  --  34.5*  --   --   --   --   TROPONINI  --  0.05* 0.06*  --  0.07*  --     Estimated Creatinine Clearance: 48.9 mL/min (A) (by C-G formula based on SCr of 2.44 mg/dL (H)).   Medical History: Past Medical History:  Diagnosis Date  . Anxiety   . Asthma   . CHF (congestive heart failure) ([place])   . COPD (chronic obstructive pulmonary disease) ([place])   . Depression   . Diabetes mellitus   . Gout   . Headache(784.0)   . Hypertension   . Shortness of breath   . Stroke [facility])     Medications:  Scheduled:  . chlorhexidine gluconate (MEDLINE KIT)  15 mL Mouth Rinse BID  . [START ON 11/24/2017] clopidogrel  75 mg Oral Daily  . feeding supplement (PRO-STAT SUGAR FREE 64)  30 mL Per Tube 5 X Daily  . insulin aspart  0-9 Units Subcutaneous Q4H  . mouth rinse  15 mL Mouth Rinse 10 times per day  . pantoprazole (PROTONIX) IV  40 mg Intravenous QHS   Infusions:  . sodium chloride    . cefTRIAXone (ROCEPHIN)  IV Stopped (11/23/17 1129)  . feeding  supplement (VITAL HIGH PROTEIN) 1,000 mL (11/22/17 1416)  . fentaNYL infusion INTRAVENOUS 50 mcg/hr (11/23/17 1241)  . propofol (DIPRIVAN) infusion 15 mcg/kg/min (11/23/17 1555)    Assessment: 62 yo M admitted with multisystem organ failure after being bedbound for 5 days PTA.  Noted to have thrombocytopenia with PLTC 90s (baseline back in Sept 300s).  Also noted elevated INR and LFTs on admission (trending down).  Will repeat INR with AM labs.    ECHO today revealed new apical thrombus and EF 10%.  Pharmacy has been asked to initiate anticoagulation with heparin.  Goal of Therapy:  Heparin level 0.3-0.7 units/ml Monitor platelets by anticoagulation protocol: Yes   Plan:  Start heparin infusion at 2000 units/hr Check anti-Xa level in 8 hours and daily while on heparin Continue to monitor H&H and platelets Check INR in AM  Spoke  with Dr. Andy Gauss, will delay initiation of warfarin at this time given critical illness.  [employer], Pharm.D., BCPS Clinical Pharmacist 11/23/2017 6:20 PM

## 2017-11-23 NOTE — Progress Notes (Signed)
Subjective:  Unable to obtain. Patient intubated and sedated.  Diuresing well without lasix  Objective:  Vital Signs in the last 24 hours: Temp:  [96.9 F (36.1 C)-98.8 F (37.1 C)] 98 F (36.7 C) (02/14 1202) Pulse Rate:  [96-110] 109 (02/14 1212) Resp:  [16-25] 23 (02/14 1212) BP: (71-126)/(47-113) 120/94 (02/14 1212) SpO2:  [95 %-99 %] 96 % (02/14 1212) FiO2 (%):  [30 %] 30 % (02/14 1212) Weight:  [141.7 kg (312 lb 6.3 oz)] 141.7 kg (312 lb 6.3 oz) (02/14 0500)  Intake/Output from previous day: 02/13 0701 - 02/14 0700 In: 1828.5 [P.O.:7; I.V.:644.8; NG/GT:1026.7; IV Piggyback:150] Out: 1975 [Urine:1975] Intake/Output from this shift: Total I/O In: 388.1 [I.V.:88.1; NG/GT:300] Out: 350 [Urine:350]  Physical Exam: Nursing note and vitals reviewed. Constitutional: He appears well-developed.  Morbidly obese. Intubated, sedated   HENT:  Head: Normocephalic.  Eyes: Scleral icterus is present.  Neck: Normal range of motion. Neck supple. JVD present.  Cardiovascular: Normal rate and regular rhythm.  Murmur (II/VI holosystolic murmur RLSB) heard. Distant heart sounds.   Respiratory: Effort normal.  B/l coarse breath sounds   GI: Soft. Bowel sounds are normal. He exhibits distension.  Musculoskeletal: Normal range of motion. He exhibits edema (1+ b/l).  Lymphadenopathy:    He has no cervical adenopathy.  Neurological:  Intubated, sedated   Skin: Skin is warm and dry.  Psychiatric:  Not assessed     Lab Results: Recent Labs    11/22/17 0430 11/23/17 0524  WBC 15.0* 10.3  HGB 12.5* 12.5*  PLT 88* 86*   Recent Labs    11/22/17 0430 11/23/17 0524  NA 134* 137  K 3.7 3.2*  CL 92* 96*  CO2 24 27  GLUCOSE 118* 130*  BUN 65* 65*  CREATININE 3.01* 2.44*   Recent Labs    11/26/2017 1445 11/22/17 0451  TROPONINI 0.06* 0.07*   Hepatic Function Panel Recent Labs    11/22/17 0430 11/23/17 0524  PROT 5.9* 5.8*  ALBUMIN 2.9* 2.5*  AST 1,769* 993*  ALT  1,173* 947*  ALKPHOS 167* 166*  BILITOT 5.3* 4.7*  BILIDIR 3.0*  --   IBILI 2.3*  --    Echocardiogram pending  Assessment: Acute Respiratory failure Sepsis, likely due to pneumonia, respiratory panel pending Lactic acid elevation Elevated liver enzymes Mild troponin elevation: Type 2 MI Acute on chronic diastolic heart failure CAD status post  primary PCI LCx PDA Synergy DES 2.5 X 16 mm 07/09/2017 AKI/CKD Hypertension Type 2 DM: well controlled Hyperlipidemia Morbid obesity Tobacco abuse   Recommendations:  Agree with current management of respiratory failure per critical care team. Diuresing well without lasix. Lasix as needed. Echocardiogram pending Continue aspirin 81 mg, Brilinta 90 mg twice a day. S/p primary PCI LCx PDA Synergy DES 2.5 X 16 mm ASA 81 mg lifelong, brilinta 90 mg bid for at least 1 year Baseline home medications include Coreg 25 mg bid, spironolactone 25 mg daily, Bidil 20-37.5 mg tid. Could resume as allowed by his hemodynamic status and renal funcion in the setting of sepsis. Starting with Bidil.     LOS: 2 days    Sharmel Ballantine J Zaleah Ternes 11/23/2017, 1:26 PM  Sitlali Koerner Emiliano Dyer, MD [facility]. [place] Pager: [phone] Office: [phone] If no answer Cell [phone]

## 2017-11-23 NOTE — Progress Notes (Signed)
PULMONARY / CRITICAL CARE MEDICINE   Name: Todd Sullivan MRN: [id number] DOB: 1956/08/17    ADMISSION DATE:  12/01/2017 CONSULTATION DATE:  11/16/2017  REFERRING MD: Dr. Wilkie Aye  CHIEF COMPLAINT:  SOB  HISTORY OF PRESENT ILLNESS:   HPI obtained from medical chart review as patient is currently intubated and sedated on mechanical ventilation.  62 year old male with past medical history significant for COPD, asthma, diabetes, CAD status post STEMI/ stent 06/2017, hypertension, and CVA who presented via EMS with complaints of shortness of breath and altered mental status. His significant other reports she has not done well since he MI 3 months ago.  Also reports he has not taken his medications for the last 5 days.  He has not been out of bed for the last 5 days.  Found him after he fell out of bed called 911 and was transported to [facility].  On arrival to the emergency room he was unable to protect his airway and was intubated.  He is to have a CT of his head to rule out CVA and/or trauma since he has had a history of CVA in the past.  On my arrival in the emergency room to trauma a he was sedated with propofol and going to CT scan for evaluation.  Neuro exam was difficult due to high level of sedation.  Difficult due to high level sedation  Note he had profound hypoglycemia otherwise not on insulin but he is on metformin.  He had to be started on dextrose 10% drip to maintain his glucose levels greater than 90.  Labs noted for BUN 53, sCr 3.18 (baseline 1.5-1.6), lactic acid 11.99 -> 9.52, BNP 1886, AST 1073, ALT 804, t. Bili 5.4, WBC 14.4, Plts 107, TSH 0.888, initial CXR showing pulmonary edema   We will admit to the intensive care unit continue to explore laboratory data evaluate CT scan of the head.  Treat underlying lactic acidosis, evaluate for infectious process and cardiology is to evaluate.  PAST MEDICAL HISTORY :  He  has a past medical history of Anxiety, Asthma, CHF  (congestive heart failure) (HCC), COPD (chronic obstructive pulmonary disease) (HCC), Depression, Diabetes mellitus, Gout, Headache(784.0), Hypertension, Shortness of breath, and Stroke (HCC).  PAST SURGICAL HISTORY: He  has a past surgical history that includes Eye surgery (61 years old); LEFT HEART CATH AND CORONARY ANGIOGRAPHY (N/A, 07/06/2017); and Coronary/Graft Acute MI Revascularization (N/A, 07/06/2017).  Allergies  Allergen Reactions  . Banana     Sick   . Egg Yolk Nausea And Vomiting  . Kiwi Extract Nausea And Vomiting    No current facility-administered medications on file prior to encounter.    Current Outpatient Medications on File Prior to Encounter  Medication Sig  . albuterol (PROVENTIL HFA;VENTOLIN HFA) 108 (90 BASE) MCG/ACT inhaler Inhale 2 puffs into the lungs every 4 (four) hours as needed. For shortness of breath.  Marland Kitchen atorvastatin (LIPITOR) 80 MG tablet Take 1 tablet (80 mg total) by mouth daily at 6 PM.  . budesonide-formoterol (SYMBICORT) 160-4.5 MCG/ACT inhaler Inhale 2 puffs into the lungs 2 (two) times daily.  . colchicine 0.6 MG tablet Take 0.6 mg by mouth at bedtime.   . cyclobenzaprine (FLEXERIL) 10 MG tablet Take 10 mg by mouth 2 (two) times daily.  Marland Kitchen diltiazem (TIAZAC) 180 MG 24 hr capsule Take 360 mg by mouth at bedtime.  . furosemide (LASIX) 40 MG tablet Take 1 tablet (40 mg total) by mouth as needed. For leg swelling and shortness of  breath  . isosorbide-hydrALAZINE (BIDIL) 20-37.5 MG tablet Take 1 tablet by mouth 3 (three) times daily.  . metFORMIN (GLUCOPHAGE) 500 MG tablet Take 500 mg by mouth 2 (two) times daily with a meal.  . ticagrelor (BRILINTA) 90 MG TABS tablet Take 1 tablet (90 mg total) by mouth 2 (two) times daily.  . carvedilol (COREG) 25 MG tablet Take 1 tablet (25 mg total) by mouth 2 (two) times daily with a meal. (Patient not taking: Reported on 11/13/2017)  . nicotine (NICODERM CQ - DOSED IN MG/24 HR) 7 mg/24hr patch Place 1 patch (7 mg  total) onto the skin daily. (Patient not taking: Reported on 12/03/2017)    FAMILY HISTORY:  His indicated that the status of his mother is unknown. He indicated that the status of his sister is unknown. He indicated that the status of his brother is unknown. He indicated that the status of his son is unknown.   SOCIAL HISTORY: He  reports that he has been smoking cigarettes.  He has a 20.00 pack-year smoking history. he has never used smokeless tobacco. He reports that he does not drink alcohol or use drugs.  REVIEW OF SYSTEMS:   Not available  SUBJECTIVE:  Morbidly obese male sedated and intubated and on ventilator.  VITAL SIGNS: BP (!) 124/103   Pulse (!) 104   Temp 98.8 F (37.1 C) (Oral)   Resp 16   Ht 6\' 3"  (1.905 m)   Wt (!) 312 lb 6.3 oz (141.7 kg)   SpO2 98%   BMI 39.05 kg/m   HEMODYNAMICS: CVP:  [8 mmHg-16 mmHg] 10 mmHg  VENTILATOR SETTINGS: Vent Mode: PRVC FiO2 (%):  [30 %] 30 % Set Rate:  [16 bmp] 16 bmp Vt Set:  [630 mL-680 mL] 680 mL PEEP:  [5 cmH20] 5 cmH20 Plateau Pressure:  [18 cmH20-27 cmH20] 22 cmH20  INTAKE / OUTPUT: I/O last 3 completed shifts: In: 2791 [P.O.:7; I.V.:1247.3; NG/GT:1386.7; IV Piggyback:150] Out: 2175 [Urine:2175]  PHYSICAL EXAMINATION: General: Morbidly obese male who sedated with propofol and orotracheally intubated Neuro:.  Currently heavily sedated HEENT: Right IJ CVL is noted, endotracheal tube connected to ventilator.  Short neck.  Pupils equal reactive light. Cardiovascular: Heart sounds are distant Lungs: Coarse rhonchi bilaterally decreased in the bases Abdomen: Obese soft nontender Musculoskeletal: Intact Skin: Lower extremities with 3+ edema.  Noted to have venous stasis changes.  LABS:  BMET Recent Labs  Lab 12/07/2017 0823 11/22/17 0430 11/23/17 0524  NA 135 134* 137  K 4.1 3.7 3.2*  CL 95* 92* 96*  CO2 18* 24 27  BUN 57* 65* 65*  CREATININE 2.87* 3.01* 2.44*  GLUCOSE 146* 118* 130*     Electrolytes Recent Labs  Lab 11/10/2017 0823 11/22/17 0430 11/23/17 0524  CALCIUM 8.9 8.8* 8.4*  MG 1.8 2.0  --   PHOS 5.4* 3.7  --     CBC Recent Labs  Lab 12/07/2017 0823 11/22/17 0430 11/23/17 0524  WBC 16.3* 15.0* PENDING  HGB 12.1* 12.5* 12.5*  HCT 37.7* 37.5* 37.4*  PLT 96* 88* PENDING    Coag's Recent Labs  Lab 12/04/2017 0823  APTT 37*  INR 2.51    Sepsis Markers Recent Labs  Lab 12/02/2017 0823 11/10/2017 1053 11/22/17 1430 11/22/17 1715  LATICACIDVEN 5.8* 3.9* 1.9 1.9  PROCALCITON 1.85  --   --   --     ABG Recent Labs  Lab 12/04/2017 0520 12/07/2017 0849 11/22/17 0344  PHART 7.273* 7.332* 7.454*  PCO2ART 46.3 42.6 40.3  PO2ART 216.0* 70.0* 172.0*    Liver Enzymes Recent Labs  Lab 12/04/2017 0823 11/22/17 0430 11/23/17 0524  AST 1,458* 1,769* 993*  ALT 931* 1,173* 947*  ALKPHOS 171* 167* 166*  BILITOT 5.9* 5.3* 4.7*  ALBUMIN 3.1* 2.9* 2.5*    Cardiac Enzymes Recent Labs  Lab 11/12/2017 0823 12/02/2017 1445 11/22/17 0451  TROPONINI 0.05* 0.06* 0.07*    Glucose Recent Labs  Lab 11/22/17 0750 11/22/17 1113 11/22/17 1518 11/22/17 1940 11/22/17 2333 11/23/17 0354  GLUCAP 117* 112* 129* 109* 115* 130*    Imaging Dg Chest Port 1 View  Result Date: 11/23/2017 CLINICAL DATA:  Pneumonia. EXAM: PORTABLE CHEST 1 VIEW COMPARISON:  11/22/2017. FINDINGS: Enlarged cardiomediastinal silhouette. Support tubes and lines are stable. LEFT base opacity unchanged, concern for pneumonia, with associated atelectasis. Early opacity at the RIGHT base is stable. IMPRESSION: Stable chest.  Concern for LEFT lower lobe consolidation/pneumonia. Electronically Signed   By: Elsie Stain M.D.   On: 11/23/2017 06:33     STUDIES:  11/15/2017 CT of the head>>  CULTURES: 12/04/2017 blood cultures x2>>NGTD 11/13/2017 sputum culture>>NGTD 11/27/2017 urine culture>>NGTD 11/20/2017 pro-calcitonin>>1.85 11/24/2017 respiratory virus panel>>Negative    ANTIBIOTICS: 2/12 Vancomycin>> 2/12 Zosyn>>2/13 2/13 Levaquin>>  SIGNIFICANT EVENTS: 11/20/2017 intubated for respiratory failure  LINES/TUBES: 11/19/2017 endotracheal tube>> 12/03/2017 right internal jugular CVL>>  DISCUSSION: 62 year old morbidly obese male who modality has been smoking recently states he is on NicoDerm patches.  He is noted to be and have been declining over the past 3 months following cardiac cath MI and stent placement.  He has not taken his medication for the last 5 days nor has he been out of bed for the last 5 days.  Significant other noted that he got up and fell during the night EMS was activated his transport to the emergency room he was unable to protect his airway and was intubated.  ASSESSMENT / PLAN:  PULMONARY A: Vent dependent respiratory failure secondary to inability to protect airway. CXR is stable with LLL consolidation. BNP improving. Good urine output. Good oxygen saturation on PRVC with PEEP 5 and FiO2 30%. On CAP coverage with Vanc and Levo.  P:   Continue Vanc and Levo Bronchodilators as needed CXR daily Full vent support- wean as tolerated Repeat ABG as needed  CARDIOVASCULAR A:  Coronary artery disease history with MI 3 months ago (9/18) primary PCI to dominant left circumflex and PDA with synergy drug-eluting stent..  Last 2D echo revealed EF of 30% left ventricular hypokinesis. Improved diuresis overnight. BNP is down trending.  P:  Follow up on cardiology consult, appreciate recs  Follow up on Echo Continue with lasix 40 mg q6 Continue ASA and brilinta Resume home meds as needed  RENAL Lab Results  Component Value Date   CREATININE 2.44 (H) 11/23/2017   CREATININE 3.01 (H) 11/22/2017   CREATININE 2.87 (H) 11/12/2017   Recent Labs  Lab 11/30/2017 0823 11/22/17 0430 11/23/17 0524  NA 135 134* 137   Recent Labs  Lab 12/07/2017 0823 11/22/17 0430 11/23/17 0524  K 4.1 3.7 3.2*   A:   Acute renal failure with last  known creatinine 1.87. On admission creat  3.18.  AKI likely secondary to sepsis as well as decompensated CHF with volume overload. This am creat 2.44 improved from 3.01. Patient with good diuresis off lasix. Net volume negative. CK is within normal limit.   P:   IVF as needed  Serial monitoring of creatinine  GASTROINTESTINAL A:   Morbid obesity GI  protection P:   PPI Orogastric tube, if remains intubated greater than 24 hours for pursue tube feedings Consult nutrition for TF as per nutrition  HEMATOLOGIC Recent Labs    11/22/17 0430 11/23/17 0524  HGB 12.5* 12.5*   A:   No acute issues. DVT prophylaxis No overt need for anticoagulation at this time. P:  SCDs in place  INFECTIOUS A:   Elevated lactic acid and WBC on admission. Improved overnight and continue to trend down.Currently on Vanc and Levaquin.  CXR showing LLL consolidation likely PNA. RVP and Flu are negative.  P:   Continue Vanc and levaquin  Order Urine strep and legionella F/u on blood, tracheal and urine cultures   ENDOCRINE CBG (last 3)  Recent Labs    11/22/17 1940 11/22/17 2333 11/23/17 0354  GLUCAP 109* 115* 130*    A:   T2DM, A1c 6.5. Initially hypoglycemic requiring D10. Patient is only on metformin. Hypoglycemia likely secondary to poor po intake in the past few days and infectious process.  P:   Hold all diabetic medications Monitor CBG every 4 hours  NEUROLOGIC A:   Altered mental status, likely multifactorial in the setting of sepsis and hypoglycemia. Head CT negative for any acute intracranial abnormalities. On propofol and fentanyl  P:   RASS goal: -1 CT of the head rule out trauma Wean sedation as tolerating   FAMILY  - Updates: No family at bedside 11/22/2017  - Inter-disciplinary family meet or Palliative Care meeting due by:  day 7   Lovena Neighbours, MD [facility] Family Medicine, PGY-2  11/23/2017, 7:19 AM

## 2017-11-23 NOTE — Progress Notes (Addendum)
Echocardiogram findings reviewed. Severe biventricular failure. LVEF 10%. Apical thrombus seen.  This raises the question whether this was cardiogenic shock primarily. However, minimal troponin elevation, and rapid clearance of lactic acid with management of sepsis makes primary cardiogenic shock less likely. He was tested positive for cocaine this admission. 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  His prognosis remains guarded at this time. He has remained normotensive for the most part during this hospital admission. Lactic acid and creatinine have improved. His urine output is low today.   Recommend starting on and inodilator milrinone as a temporizing measure. This will have to be dosed renally. Recommend Lasix prn in addition to milrinone to maintain urine output. Given LV thrombus, recommend starting warfarin/heparin. Could stop aspirin and switch brilinta to plavix to reduce bleeding risk.  With his morbid obesity, history of medication noncompliance, and most importantly substance abuse with urine positive for cocaine, he is not a candidate for any advanced heart failure therapy. I would not recommend escalation for his cardiac care beyond management with temporary inotropic support.   Elder Negus, MD [facility] Cardi[facility]. [place] Pager: [phone] Office: [phone] If no answer Cell [phone]

## 2017-11-23 NOTE — Progress Notes (Signed)
New findings of apical thrombus on Echo. Discussed case with Cardiology who recommended starting patient on Heparin and Coumadin. They also recommended discontinuing Brilinta and ASA and will start patient on Plavix.  Lovena Neighbours, MD [facility] Family [facility], PGY-2

## 2017-11-23 NOTE — Progress Notes (Signed)
  Echocardiogram 2D Echocardiogram has been performed.  Todd Sullivan F 11/23/2017, 2:52 PM

## 2017-11-23 NOTE — Care Management Note (Signed)
Case Management Note  Patient Details  Name: Todd Sullivan MRN: [id number] Date of Birth: January 24, 1956  Subjective/Objective:  Admitted with severe encephalopathy                  Action/Plan:   PTA independent from home - however has experienced decline for last couple of months.  Pt will need PT eval once extubated    Expected Discharge Date:                  Expected Discharge Plan:  Home/Self Care  In-House Referral:     Discharge planning Services  CM Consult  Post Acute Care Choice:    Choice offered to:     DME Arranged:    DME Agency:     HH Arranged:    HH Agency:     Status of Service:     If discussed at [employer], dates discussed:    Additional Comments:  Cherylann Parr, RN 11/23/2017, 3:38 PM

## 2017-11-23 NOTE — Progress Notes (Signed)
Pharmacy Consult for Milrinone (Primacor) Initiation  Indication:   Acute Decompensated Heart Failure with volume overload and low cardiac output  Allergies  Allergen Reactions  . Banana     Sick   . Egg Yolk Nausea And Vomiting  . Kiwi Extract Nausea And Vomiting    Temp:  [96.9 F (36.1 C)-98.8 F (37.1 C)] 98.2 F (36.8 C) (02/14 1532) Pulse Rate:  [96-110] 104 (02/14 1700) Cardiac Rhythm: Bundle branch block;Heart block (02/14 1600) Resp:  [16-25] 22 (02/14 1700) BP: (71-126)/(47-113) 98/82 (02/14 1700) SpO2:  [95 %-99 %] 99 % (02/14 1700) FiO2 (%):  [30 %] 30 % (02/14 1600) Weight:  [312 lb 6.3 oz (141.7 kg)] 312 lb 6.3 oz (141.7 kg) (02/14 0500)  LABS    Component Value Date/Time   NA 137 11/23/2017 0524   K 3.2 (L) 11/23/2017 0524   CL 96 (L) 11/23/2017 0524   CO2 27 11/23/2017 0524   GLUCOSE 130 (H) 11/23/2017 0524   BUN 65 (H) 11/23/2017 0524   CREATININE 2.44 (H) 11/23/2017 0524   CALCIUM 8.4 (L) 11/23/2017 0524   GFRNONAA 27 (L) 11/23/2017 0524   GFRAA 31 (L) 11/23/2017 0524   Last magnesium:  Lab Results  Component Value Date   MG 2.0 11/22/2017   Estimated Creatinine Clearance: 48.9 mL/min (A) (by C-G formula based on SCr of 2.44 mg/dL (H)). Serum creatinine: 2.44 mg/dL (H) 54/65/03 5465 Estimated creatinine clearance: 48.9 mL/min (A) estimated creatinine clearance is 48.9 mL/min (A) (by C-G formula based on SCr of 2.44 mg/dL (H)).   Intake/Output Summary (Last 24 hours) at 11/23/2017 1851 Last data filed at 11/23/2017 1600 Gross per 24 hour  Intake 1586.47 ml  Output 1850 ml  Net -263.53 ml    Filed Weights   12/19/2017 1600 11/22/17 0500 11/23/17 0500  Weight: (!) 310 lb 10.1 oz (140.9 kg) (!) 312 lb 13.3 oz (141.9 kg) (!) 312 lb 6.3 oz (141.7 kg)    Assessment:   62 y.o. male admitted 2017/12/19 with possible septic shock treated with ABX.  BNP 3000, LA 3.9> improved 1.9, LFTs elevated 900s, Cr 3>  improved 2.4, K 3.2> replaced, Mag 2.  New  ECHO 2/14 shows EF dec 30>10% plan to check COOX and  initiated on milrinone.    Call physician for replacement if potassium is < 4 or magnesium is < 2 and replacement has not already been ordered.  Milrinone can cause arrhythmias.  Monitor patient for ECG changes.  Plan is to initiate milrinone for inotropic support.  Plan:  1. Initiate milrinone based on renal function: (Consider starting dose of 0.125 - 0.25 for patients with SBP <183mmHg) [employer] Calculated CrCl Dose  []  > 50 ml/min 0.375 mcg/kg/min  [x]  20-49 ml/min 0.250 mcg/kg/min  []  < 20 ml/min 0.125 mcg/kg/min   2. Nursing to monitor vital signs per milrinone protocol and physician parameters. 3. Pharmacy to follow peripherally, please reconsult if needed or there is further questions. 4.  Please contact MD for further dosing instructions.  Leota Sauers Pharm.D. CPP, BCPS Clinical Pharmacist [phone] 11/23/2017 6:56 PM

## 2017-11-24 ENCOUNTER — Inpatient Hospital Stay (HOSPITAL_COMMUNITY): Payer: Medicare Other

## 2017-11-24 DIAGNOSIS — I509 Heart failure, unspecified: Secondary | ICD-10-CM

## 2017-11-24 LAB — COOXEMETRY PANEL
Carboxyhemoglobin: 1.5 % (ref 0.5–1.5)
Methemoglobin: 1.2 % (ref 0.0–1.5)
O2 Saturation: 86.1 %
Total hemoglobin: 11.7 g/dL — ABNORMAL LOW (ref 12.0–16.0)

## 2017-11-24 LAB — HEPARIN LEVEL (UNFRACTIONATED)
Heparin Unfractionated: 0.5 [IU]/mL (ref 0.30–0.70)
Heparin Unfractionated: 0.6 IU/mL (ref 0.30–0.70)

## 2017-11-24 LAB — CBC
HCT: 35.2 % — ABNORMAL LOW (ref 39.0–52.0)
Hemoglobin: 11.9 g/dL — ABNORMAL LOW (ref 13.0–17.0)
MCH: 25.6 pg — ABNORMAL LOW (ref 26.0–34.0)
MCHC: 33.8 g/dL (ref 30.0–36.0)
MCV: 75.7 fL — ABNORMAL LOW (ref 78.0–100.0)
PLATELETS: 80 10*3/uL — AB (ref 150–400)
RBC: 4.65 MIL/uL (ref 4.22–5.81)
RDW: 19 % — ABNORMAL HIGH (ref 11.5–15.5)
WBC: 9.4 10*3/uL (ref 4.0–10.5)

## 2017-11-24 LAB — COMPREHENSIVE METABOLIC PANEL
ALBUMIN: 2.4 g/dL — AB (ref 3.5–5.0)
ALT: 646 U/L — AB (ref 17–63)
ANION GAP: 11 (ref 5–15)
AST: 525 U/L — ABNORMAL HIGH (ref 15–41)
Alkaline Phosphatase: 148 U/L — ABNORMAL HIGH (ref 38–126)
BUN: 62 mg/dL — ABNORMAL HIGH (ref 6–20)
CHLORIDE: 99 mmol/L — AB (ref 101–111)
CO2: 28 mmol/L (ref 22–32)
CREATININE: 1.97 mg/dL — AB (ref 0.61–1.24)
Calcium: 8.4 mg/dL — ABNORMAL LOW (ref 8.9–10.3)
GFR calc non Af Amer: 35 mL/min — ABNORMAL LOW (ref >60)
GFR, EST AFRICAN AMERICAN: 41 mL/min — AB (ref >60)
Glucose, Bld: 125 mg/dL — ABNORMAL HIGH (ref 65–99)
Potassium: 3.5 mmol/L (ref 3.5–5.1)
SODIUM: 138 mmol/L (ref 135–145)
Total Bilirubin: 4.5 mg/dL — ABNORMAL HIGH (ref 0.3–1.2)
Total Protein: 5.3 g/dL — ABNORMAL LOW (ref 6.5–8.1)

## 2017-11-24 LAB — GLUCOSE, CAPILLARY
GLUCOSE-CAPILLARY: 112 mg/dL — AB (ref 65–99)
GLUCOSE-CAPILLARY: 113 mg/dL — AB (ref 65–99)
GLUCOSE-CAPILLARY: 124 mg/dL — AB (ref 65–99)
GLUCOSE-CAPILLARY: 132 mg/dL — AB (ref 65–99)
Glucose-Capillary: 113 mg/dL — ABNORMAL HIGH (ref 65–99)
Glucose-Capillary: 120 mg/dL — ABNORMAL HIGH (ref 65–99)
Glucose-Capillary: 127 mg/dL — ABNORMAL HIGH (ref 65–99)

## 2017-11-24 LAB — HEPATITIS PANEL, ACUTE
HCV Ab: 0.1 s/co ratio (ref 0.0–0.9)
HEP B C IGM: NEGATIVE
HEP B S AG: NEGATIVE
Hep A IgM: NEGATIVE

## 2017-11-24 LAB — CULTURE, RESPIRATORY W GRAM STAIN

## 2017-11-24 LAB — TRIGLYCERIDES: Triglycerides: 83 mg/dL (ref <150)

## 2017-11-24 LAB — MAGNESIUM: Magnesium: 1.7 mg/dL (ref 1.7–2.4)

## 2017-11-24 LAB — BRAIN NATRIURETIC PEPTIDE: B Natriuretic Peptide: 2863.8 pg/mL — ABNORMAL HIGH (ref 0.0–100.0)

## 2017-11-24 LAB — PROTIME-INR
INR: 1.64
PROTHROMBIN TIME: 19.3 s — AB (ref 11.4–15.2)

## 2017-11-24 LAB — CULTURE, RESPIRATORY

## 2017-11-24 MED ORDER — FUROSEMIDE 10 MG/ML IJ SOLN
40.0000 mg | Freq: Every day | INTRAMUSCULAR | Status: DC
  Administered 2017-11-25: 40 mg via INTRAVENOUS
  Filled 2017-11-24: qty 4

## 2017-11-24 MED ORDER — PROPOFOL 1000 MG/100ML IV EMUL
0.0000 ug/kg/min | INTRAVENOUS | Status: DC
  Administered 2017-11-24: 7.5 ug/kg/min via INTRAVENOUS
  Administered 2017-11-24 – 2017-11-27 (×7): 15 ug/kg/min via INTRAVENOUS
  Administered 2017-11-27: 13.338 ug/kg/min via INTRAVENOUS
  Administered 2017-11-28 (×3): 15 ug/kg/min via INTRAVENOUS
  Filled 2017-11-24 (×13): qty 100

## 2017-11-24 NOTE — Progress Notes (Signed)
Subjective:  Remains intubated Following commands today  Echocardiogram findings reviewed.  Patient was started on Plavix and warfarin given apical thrombus.  Also started on milrinone given severe acute systolic failure. Maintains urine output without Lasix.  Lactic acid has normalized. Tachycardic overnight with sinus tachycardia and frequent PVCs.  Objective:  Vital Signs in the last 24 hours: Temp:  [98 F (36.7 C)-99.7 F (37.6 C)] 99 F (37.2 C) (02/15 0753) Pulse Rate:  [100-114] 111 (02/15 0837) Resp:  [16-25] 17 (02/15 0837) BP: (98-137)/(77-113) 109/81 (02/15 0837) SpO2:  [96 %-100 %] 97 % (02/15 0837) FiO2 (%):  [30 %] 30 % (02/15 0837) Weight:  [141.2 kg (311 lb 4.6 oz)] 141.2 kg (311 lb 4.6 oz) (02/15 0500)  Intake/Output from previous day: 02/14 0701 - 02/15 0700 In: 2043.7 [I.V.:743.7; NG/GT:1200; IV Piggyback:100] Out: 1825 [Urine:1825] Intake/Output from this shift: Total I/O In: 217.7 [I.V.:117.7; NG/GT:100] Out: 175 [Urine:175]  Physical Exam: Nursing note and vitals reviewed. Constitutional: He appears well-developed.  Morbidly obese. Intubated, sedated   HENT:  Head: Normocephalic.  Eyes: Scleral icterus is present.  Neck: Normal range of motion. Neck supple. JVD present.  Cardiovascular: Normal rate and regular rhythm.  Murmur (II/VI holosystolic murmur RLSB) heard. Distant heart sounds.   Respiratory: Effort normal.  B/l coarse breath sounds   GI: Soft. Bowel sounds are normal. He exhibits distension.  Musculoskeletal: Normal range of motion. He exhibits edema (1+ b/l).  Lymphadenopathy:    He has no cervical adenopathy.  Neurological:  Following commands.  Detailed examination deferred at this time. Skin: Skin is warm and dry.  Psychiatric:  Not assessed     Lab Results: Recent Labs    11/23/17 0524 11/24/17 0446  WBC 10.3 9.4  HGB 12.5* 11.9*  PLT 86* 80*   Recent Labs    11/23/17 0524 11/24/17 0446  NA 137 138  K 3.2*  3.5  CL 96* 99*  CO2 27 28  GLUCOSE 130* 125*  BUN 65* 62*  CREATININE 2.44* 1.97*   Recent Labs    12/16/17 1445 11/22/17 0451  TROPONINI 0.06* 0.07*   Hepatic Function Panel Recent Labs    11/22/17 0430  11/24/17 0446  PROT 5.9*   < > 5.3*  ALBUMIN 2.9*   < > 2.4*  AST 1,769*   < > 525*  ALT 1,173*   < > 646*  ALKPHOS 167*   < > 148*  BILITOT 5.3*   < > 4.5*  BILIDIR 3.0*  --   --   IBILI 2.3*  --   --    < > = values in this interval not displayed.   Echocardiogram 11/23/2017 Study Conclusions   - Left ventricle: The cavity size was moderately dilated. Wall   thickness was increased in a pattern of mild LVH. Systolic   function was severely reduced. The estimated ejection fraction   was in the range of 10% to 15%. Diffuse hypokinesis. Akinesis of   the apical myocardium. The study is not technically sufficient to   allow evaluation of LV diastolic function. Acoustic contrast   opacification revealed a large, apicalthrombusassociated with an   akinetic segment. - Ventricular septum: The contour showed diastolic flattening and   systolic flattening. These changes are consistent with RV volume   and pressure overload. - Aortic valve: There was trivial regurgitation. - Mitral valve: There was mild to moderate regurgitation. - Left atrium: The atrium was severely dilated. - Right ventricle: The cavity size was severely  dilated. Systolic   function was severely reduced. - Right atrium: The atrium was severely dilated. - Atrial septum: No defect or patent foramen ovale was identified. - Tricuspid valve: There was moderate regurgitation.  Assessment: Acute systolic heart failure, resolving cardiogenic shock Minimal troponin elevation, and rapid clearance of lactic acid with management of sepsis makes primary cardiogenic shock less likely. He was tested positive for cocaine this admission. 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 His prognosis remains guarded at this time. He has remained normotensive for the most part during this hospital admission. Mild tachycardia could suggest impending shock. Lactic acid and creatinine have improved. His urine output is stable without diuretics LV apical thrombus Acute Respiratory failure Sepsis, likely due to pneumonia Lactic acid elevation, resolved Elevated liver enzymes: Likely shock liver. Now improving Mild troponin elevation: Type 2 MI CAD status post  primary PCI LCx PDA Synergy DES 2.5 X 16 mm 07/09/2017 AKI/CKD H/o Hypertension Type 2 DM Hyperlipidemia Morbid obesity Tobacco abuse Polysubstance abuse, including cocaine positive this admission H/o CVA   Recommendations:  Continue renally dosed milrinone. Recommend lasix to maintain net -ve 1 L Given LV thrombus, recommend starting warfarin/heparin. Stop aspirin and switch brilinta to plavix to reduce bleeding risk. Over the next 48 hrs, could add low dose captopril for afterload reduction, if BP and renal function would allow. With his morbid obesity, history of medication noncompliance, h/o CVA, and most importantly substance abuse with urine positive for cocaine, he is not a candidate for any advanced heart failure therapy. I would not recommend escalation for his cardiac care beyond management with temporary inotropic support. Continue aspirin 81 mg, Brilinta 90 mg twice a day. S/p primary PCI LCx PDA Synergy DES 2.5 X 16 mm ASA 81 mg lifelong, brilinta 90 mg bid for at least 1 year Baseline home medications include Coreg 25 mg bid, spironolactone 25 mg daily, Bidil 20-37.5 mg tid. Could resume as allowed by his hemodynamic status and renal funcion in the setting of sepsis. Starting with Bidil.  I discussed his current cardiac condition and prognosis with this part of her Boneta Lucks.  She is not his next of kin and not matted to him.  His next of kin  includes a son who lives in town, a son lives in [place], and a sister.  It appears that there are strained relationships which could make decision making complicated, if he is unable to be weaned off ventilator and unable to make his decisions.  Boneta Lucks mentions that she is going to contact his sister.    LOS: 3 days    Yarel Rushlow J Obie Silos 11/24/2017, 8:56 AM  Hollin Crewe Emiliano Dyer, MD [facility] [place] Pager: [phone] Office: [phone] If no answer Cell [phone]

## 2017-11-24 NOTE — Progress Notes (Signed)
ANTICOAGULATION CONSULT NOTE   Pharmacy Consult for Heparin Indication: new apical thrombus  Allergies  Allergen Reactions  . Banana     Sick   . Egg Yolk Nausea And Vomiting  . Kiwi Extract Nausea And Vomiting    Patient Measurements: Height: 6\' 3"  (190.5 cm) Weight: (!) 311 lb 4.6 oz (141.2 kg) IBW/kg (Calculated) : 84.5 Heparin Dosing Weight: 116 kg  Vital Signs: Temp: 98.6 F (37 C) (02/15 1204) Temp Source: Axillary (02/15 1204) BP: 112/77 (02/15 1215) Pulse Rate: 113 (02/15 1215)  Labs: Recent Labs    11/19/2017 1445  11/22/17 0430 11/22/17 0451 11/23/17 0524 11/24/17 0400 11/24/17 0446 11/24/17 1200  HGB  --    < > 12.5*  --  12.5*  --  11.9*  --   HCT  --   --  37.5*  --  37.4*  --  35.2*  --   PLT  --   --  88*  --  86*  --  80*  --   LABPROT  --   --   --   --   --   --  19.3*  --   INR  --   --   --   --   --   --  1.64  --   HEPARINUNFRC  --   --   --   --   --  0.50  --  0.60  CREATININE  --   --  3.01*  --  2.44*  --  1.97*  --   CKTOTAL  --   --   --   --  381  --   --   --   TROPONINI 0.06*  --   --  0.07*  --   --   --   --    < > = values in this interval not displayed.    Estimated Creatinine Clearance: 60.5 mL/min (A) (by C-G formula based on SCr of 1.97 mg/dL (H)).    Assessment: 62 yo M admitted with multisystem organ failure after being bedbound for 5 days PTA.  Noted to have thrombocytopenia with PLTC 90s (baseline back in Sept 300s).  Patient LFTS and INR trending down from admission.   ECHO 2/14 revealed new apical thrombus and EF 10%.  Pharmacy dosing heparin. Heparin level is now therapeutic X 2 at 0.60 on 2000 units/hr. CBC and platelets are down slightly but no bleeding noted.   Goal of Therapy:  Heparin level 0.3-0.7 units/ml Monitor platelets by anticoagulation protocol: Yes   Plan:  Continue heparin at 2000 units/hr Daily heparin level/CBC Monitor for sign/symptoms of bleeding   Sharin Mons, PharmD, BCPS PGY2  Infectious Diseases Pharmacy Resident Pager: [phone]

## 2017-11-24 NOTE — Progress Notes (Signed)
PULMONARY / CRITICAL CARE MEDICINE   Name: Todd Sullivan MRN: [id number] DOB: 25-Dec-1955    ADMISSION DATE:  11/18/2017 CONSULTATION DATE:  12/07/2017  REFERRING MD: Dr. Wilkie Aye  CHIEF COMPLAINT:  SOB  HISTORY OF PRESENT ILLNESS:   HPI obtained from medical chart review as patient is currently intubated and sedated on mechanical ventilation.  62 year old male with past medical history significant for COPD, asthma, diabetes, CAD status post STEMI/ stent 06/2017, hypertension, and CVA who presented via EMS with complaints of shortness of breath and altered mental status. His significant other reports she has not done well since he MI 3 months ago.  Also reports he has not taken his medications for the last 5 days.  He has not been out of bed for the last 5 days.  Found him after he fell out of bed called 911 and was transported to [facility].  On arrival to the emergency room he was unable to protect his airway and was intubated.  He is to have a CT of his head to rule out CVA and/or trauma since he has had a history of CVA in the past.  On my arrival in the emergency room to trauma a he was sedated with propofol and going to CT scan for evaluation.  Neuro exam was difficult due to high level of sedation.  Difficult due to high level sedation  Note he had profound hypoglycemia otherwise not on insulin but he is on metformin.  He had to be started on dextrose 10% drip to maintain his glucose levels greater than 90.  Labs noted for BUN 53, sCr 3.18 (baseline 1.5-1.6), lactic acid 11.99 -> 9.52, BNP 1886, AST 1073, ALT 804, t. Bili 5.4, WBC 14.4, Plts 107, TSH 0.888, initial CXR showing pulmonary edema   We will admit to the intensive care unit continue to explore laboratory data evaluate CT scan of the head.  Treat underlying lactic acidosis, evaluate for infectious process and cardiology is to evaluate.  PAST MEDICAL HISTORY :  He  has a past medical history of Anxiety, Asthma, CHF  (congestive heart failure) (HCC), COPD (chronic obstructive pulmonary disease) (HCC), Depression, Diabetes mellitus, Gout, Headache(784.0), Hypertension, Shortness of breath, and Stroke (HCC).  PAST SURGICAL HISTORY: He  has a past surgical history that includes Eye surgery (62 years old); LEFT HEART CATH AND CORONARY ANGIOGRAPHY (N/A, 07/06/2017); and Coronary/Graft Acute MI Revascularization (N/A, 07/06/2017).  Allergies  Allergen Reactions  . Banana     Sick   . Egg Yolk Nausea And Vomiting  . Kiwi Extract Nausea And Vomiting    No current facility-administered medications on file prior to encounter.    Current Outpatient Medications on File Prior to Encounter  Medication Sig  . albuterol (PROVENTIL HFA;VENTOLIN HFA) 108 (90 BASE) MCG/ACT inhaler Inhale 2 puffs into the lungs every 4 (four) hours as needed. For shortness of breath.  Marland Kitchen atorvastatin (LIPITOR) 80 MG tablet Take 1 tablet (80 mg total) by mouth daily at 6 PM.  . budesonide-formoterol (SYMBICORT) 160-4.5 MCG/ACT inhaler Inhale 2 puffs into the lungs 2 (two) times daily.  . colchicine 0.6 MG tablet Take 0.6 mg by mouth at bedtime.   . cyclobenzaprine (FLEXERIL) 10 MG tablet Take 10 mg by mouth 2 (two) times daily.  Marland Kitchen diltiazem (TIAZAC) 180 MG 24 hr capsule Take 360 mg by mouth at bedtime.  . furosemide (LASIX) 40 MG tablet Take 1 tablet (40 mg total) by mouth as needed. For leg swelling and shortness of  breath  . isosorbide-hydrALAZINE (BIDIL) 20-37.5 MG tablet Take 1 tablet by mouth 3 (three) times daily.  . metFORMIN (GLUCOPHAGE) 500 MG tablet Take 500 mg by mouth 2 (two) times daily with a meal.  . ticagrelor (BRILINTA) 90 MG TABS tablet Take 1 tablet (90 mg total) by mouth 2 (two) times daily.  . carvedilol (COREG) 25 MG tablet Take 1 tablet (25 mg total) by mouth 2 (two) times daily with a meal. (Patient not taking: Reported on 11/24/2017)  . nicotine (NICODERM CQ - DOSED IN MG/24 HR) 7 mg/24hr patch Place 1 patch (7 mg  total) onto the skin daily. (Patient not taking: Reported on 11/11/2017)    FAMILY HISTORY:  His indicated that the status of his mother is unknown. He indicated that the status of his sister is unknown. He indicated that the status of his brother is unknown. He indicated that the status of his son is unknown.   SOCIAL HISTORY: He  reports that he has been smoking cigarettes.  He has a 20.00 pack-year smoking history. he has never used smokeless tobacco. He reports that he does not drink alcohol or use drugs.  REVIEW OF SYSTEMS:   Not available  SUBJECTIVE:  Morbidly obese male sedated and intubated and on ventilator.  VITAL SIGNS: BP 127/81   Pulse (!) 114   Temp 98.7 F (37.1 C) (Oral)   Resp 16   Ht 6\' 3"  (1.905 m)   Wt (!) 311 lb 4.6 oz (141.2 kg)   SpO2 98%   BMI 38.91 kg/m   HEMODYNAMICS: CVP:  [10 mmHg-12 mmHg] 10 mmHg  VENTILATOR SETTINGS: Vent Mode: PRVC FiO2 (%):  [30 %] 30 % Set Rate:  [16 bmp] 16 bmp Vt Set:  [680 mL] 680 mL PEEP:  [5 cmH20] 5 cmH20 Pressure Support:  [14 cmH20] 14 cmH20 Plateau Pressure:  [12 cmH20-24 cmH20] 21 cmH20  INTAKE / OUTPUT: I/O last 3 completed shifts: In: 2846.3 [P.O.:7; I.V.:939.3; NG/GT:1800; IV Piggyback:100] Out: 2800 [Urine:2800]  PHYSICAL EXAMINATION: General: Morbidly obese male who sedated with propofol and orotracheally intubated Neuro:.  Currently heavily sedated HEENT: Right IJ CVL is noted, endotracheal tube connected to ventilator.  Short neck.  Pupils equal reactive light. Cardiovascular: Heart sounds are distant Lungs: Coarse rhonchi bilaterally decreased in the bases Abdomen: Obese soft nontender Musculoskeletal: Intact Skin: Lower extremities with 3+ edema.  Noted to have venous stasis changes.  LABS:  BMET Recent Labs  Lab 11/22/17 0430 11/23/17 0524 11/24/17 0446  NA 134* 137 138  K 3.7 3.2* 3.5  CL 92* 96* 99*  CO2 24 27 28   BUN 65* 65* 62*  CREATININE 3.01* 2.44* 1.97*  GLUCOSE 118* 130*  125*    Electrolytes Recent Labs  Lab 11/18/2017 0823 11/22/17 0430 11/23/17 0524 11/24/17 0446  CALCIUM 8.9 8.8* 8.4* 8.4*  MG 1.8 2.0  --  1.7  PHOS 5.4* 3.7  --   --     CBC Recent Labs  Lab 11/22/17 0430 11/23/17 0524 11/24/17 0446  WBC 15.0* 10.3 PENDING  HGB 12.5* 12.5* 11.9*  HCT 37.5* 37.4* 35.2*  PLT 88* 86* PENDING    Coag's Recent Labs  Lab 11/13/2017 0823 11/24/17 0446  APTT 37*  --   INR 2.51 1.64    Sepsis Markers Recent Labs  Lab 12/06/2017 0823 11/25/2017 1053 11/22/17 1430 11/22/17 1715  LATICACIDVEN 5.8* 3.9* 1.9 1.9  PROCALCITON 1.85  --   --   --     ABG Recent Labs  Lab 2017-11-29 0520 11-29-2017 0849 11/22/17 0344  PHART 7.273* 7.332* 7.454*  PCO2ART 46.3 42.6 40.3  PO2ART 216.0* 70.0* 172.0*    Liver Enzymes Recent Labs  Lab 11/22/17 0430 11/23/17 0524 11/24/17 0446  AST 1,769* 993* 525*  ALT 1,173* 947* 646*  ALKPHOS 167* 166* 148*  BILITOT 5.3* 4.7* 4.5*  ALBUMIN 2.9* 2.5* 2.4*    Cardiac Enzymes Recent Labs  Lab Nov 29, 2017 0823 29-Nov-2017 1445 11/22/17 0451  TROPONINI 0.05* 0.06* 0.07*    Glucose Recent Labs  Lab 11/23/17 0733 11/23/17 1142 11/23/17 1517 11/23/17 2008 11/24/17 0036 11/24/17 0342  GLUCAP 126* 117* 120* 101* 120* 124*    Imaging Dg Chest Port 1 View  Result Date: 11/24/2017 CLINICAL DATA:  Pneumonia EXAM: PORTABLE CHEST 1 VIEW COMPARISON:  11/23/2017 FINDINGS: Endotracheal tube in good position. Central venous catheter tip in the SVC. NG tube enters the stomach. Cardiac enlargement. Progressive vascular congestion and probable edema. Progression of bibasilar airspace disease which may be atelectasis or infiltrate. Small bilateral effusions IMPRESSION: Endotracheal tube remains in good position Progression of vascular congestion and probable congestive heart failure. Progression of bibasilar airspace disease and small effusions. Electronically Signed   By: Marlan Palau M.D.   On: 11/24/2017 06:55    US Abdomen Limited Ruq  Result Date: 11/23/2017 CLINICAL DATA:  Elevated liver function tests. EXAM: ULTRASOUND ABDOMEN LIMITED RIGHT UPPER QUADRANT COMPARISON:  None. FINDINGS: Gallbladder: There is mild gallbladder wall thickening. No cholelithiasis. No positive sonographic Murphy sign. No pericholecystic fluid. Common bile duct: Diameter: 4 mm Liver: There is increased hepatic echogenicity. Incompletely visualized volume of ascites. Portal vein is patent on color Doppler imaging with normal direction of blood flow towards the liver. IMPRESSION: 1. No evidence of acute cholecystitis. 2. Increased hepatic parenchymal echogenicity and mild gallbladder wall thickening are likely secondary to underlying chronic liver disease. 3. Ascites. Electronically Signed   By: Deatra Robinson M.D.   On: 11/23/2017 17:50     STUDIES:  11-29-17 CT of the head>>No acute intracranial abnormalities CXR 2/15 >>Progression of bibasilar airspace disease and small pleural effusion.   CULTURES: 11/29/2017 blood cultures x2>>NGTD 11-29-2017 sputum culture>>NGTD 11/29/2017 urine culture>>NGTD Nov 29, 2017 pro-calcitonin>>1.85 November 29, 2017 respiratory virus panel>>Negative   ANTIBIOTICS: 2/12 Vancomycin>> 2/12 Zosyn>>2/13 2/13 Levaquin>>2/14 2/14 Ceftriaxone >>   SIGNIFICANT EVENTS: 11/29/17 intubated for respiratory failure  LINES/TUBES: 29-Nov-2017 endotracheal tube>> November 29, 2017 right internal jugular CVL>>  DISCUSSION: 62 year old morbidly obese male who modality has been smoking recently states he is on NicoDerm patches.  He is noted to be and have been declining over the past 3 months following cardiac cath MI and stent placement.  He has not taken his medication for the last 5 days nor has he been out of bed for the last 5 days.  Significant other noted that he got up and fell during the night EMS was activated his transport to the emergency room he was unable to protect his airway and was intubated.  ASSESSMENT  / PLAN:  PULMONARY A: Vent dependent respiratory failure secondary to inability to protect airway. CXR is stable with LLL consolidation. BNP unchanged. Good urine output. Good oxygen saturation on PRVC with PEEP 5 and FiO2 30%. On CAP coverage with Ceftriaxone.  P:   Continue ceftriaxone  Bronchodilators as needed CXR daily Full vent support- wean as tolerated Repeat ABG as needed  CARDIOVASCULAR A:  Coronary artery disease history with MI 3 months ago (9/18) primary PCI to dominant left circumflex and PDA with synergy drug-eluting stent.  Echo  showed EF 10-15% with diffuse hypokinesis and apical akinesis with large apical thrombus. CXR showed worsening bibasilar infiltrates with unchanged BNP. Maintaining good BP.  P:  Follow up on cardiology consult, appreciate recs  Continue with lasix 40 mg q6 Start patient on Plavix, Heparin, coumadin and milrinone  Discontinue ASA and brilinta Resume home meds as needed  RENAL Lab Results  Component Value Date   CREATININE 1.97 (H) 11/24/2017   CREATININE 2.44 (H) 11/23/2017   CREATININE 3.01 (H) 11/22/2017   Recent Labs  Lab 11/22/17 0430 11/23/17 0524 11/24/17 0446  NA 134* 137 138   Recent Labs  Lab 11/22/17 0430 11/23/17 0524 11/24/17 0446  K 3.7 3.2* 3.5   A:   Acute renal failure with last known creatinine 1.87. On admission creat  3.18.  AKI likely secondary to sepsis as well as decompensated CHF with volume overload. This am creat 1.91 continue to improve.  Patient with good diuresis off lasix. Net volume positive. Given last echo findings with significantly decreased EF and worsening edema. Will need to increase diuresis.  P:   Lasix 40 mg q6  Serial monitoring of creatinine  GASTROINTESTINAL A:  Hep panel negative. RUQ showed increased parenchymal echogenicity consistent chronic disease with ascites. Given EF, elevated BNP. Hepatic dysfunction likely secondary to heart failure and more consistent with shock liver.  Transaminitis is improving.  Morbid obesity GI protection P:   PPI Orogastric tube, if remains intubated greater than 24 hours for pursue tube feedings Consult nutrition for TF as per nutrition  HEMATOLOGIC Recent Labs    11/23/17 0524 11/24/17 0446  HGB 12.5* 11.9*   A:  Large apical thrombus seen on Echo 2/14 with akinetic apex. Patient started on hep and coumadin. Will also start plavix.  P:  Monitor PT/INR Discontinue SCDs  INFECTIOUS A:   Elevated lactic acid and WBC on admission. Improved overnight and continue to trend down.Currently onCeftriaxone.  CXR showing LLL consolidation likely PNA. RVP and Flu are negative.  P:   Continue ceftriaxone F/u on blood, tracheal and urine cultures   ENDOCRINE CBG (last 3)  Recent Labs    11/23/17 2008 11/24/17 0036 11/24/17 0342  GLUCAP 101* 120* 124*    A:   T2DM, A1c 6.5. Initially hypoglycemic requiring D10. Patient is only on metformin. Hypoglycemia likely secondary to poor po intake in the past few days and infectious process.  P:   Hold all diabetic medications Monitor CBG every 4 hours  NEUROLOGIC A:   Altered mental status, likely multifactorial in the setting of sepsis and hypoglycemia. Head CT negative for any acute intracranial abnormalities. On propofol and fentanyl.  P:   RASS goal: -1 CT of the head rule out trauma Wean sedation as tolerating   FAMILY  - Updates: Girlfriend at bedside 11/24/2017  - Inter-disciplinary family meet or Palliative Care meeting due by:  day 7   Lovena Neighbours, MD [facility], PGY-2  11/24/2017, 7:21 AM

## 2017-11-24 NOTE — Progress Notes (Signed)
[facility] for Heparin Indication: new apical thrombus  Allergies  Allergen Reactions  . Banana     Sick   . Egg Yolk Nausea And Vomiting  . Kiwi Extract Nausea And Vomiting    Patient Measurements: Height: 6' 3"  (190.5 cm) Weight: (!) 311 lb 4.6 oz (141.2 kg) IBW/kg (Calculated) : 84.5 Heparin Dosing Weight: 116 kg  Vital Signs: Temp: 98.7 F (37.1 C) (02/15 0343) Temp Source: Oral (02/15 0343) BP: 118/78 (02/15 0400) Pulse Rate: 111 (02/15 0400)  Labs: Recent Labs    12/01/2017 0823 11/22/2017 1445 11/22/17 0430 11/22/17 0451 11/23/17 0524 11/24/17 0400 11/24/17 0446  HGB 12.1*  --  12.5*  --  12.5*  --   --   HCT 37.7*  --  37.5*  --  37.4*  --   --   PLT 96*  --  88*  --  86*  --   --   APTT 37*  --   --   --   --   --   --   LABPROT 26.9*  --   --   --   --   --  19.3*  INR 2.51  --   --   --   --   --  1.64  HEPARINUNFRC  --   --   --   --   --  0.50  --   CREATININE 2.87*  --  3.01*  --  2.44*  --   --   CKTOTAL 1,926*  --   --   --  381  --   --   CKMB 34.5*  --   --   --   --   --   --   TROPONINI 0.05* 0.06*  --  0.07*  --   --   --     Estimated Creatinine Clearance: 48.8 mL/min (A) (by C-G formula based on SCr of 2.44 mg/dL (H)).   Medical History: Past Medical History:  Diagnosis Date  . Anxiety   . Asthma   . CHF (congestive heart failure) ([place])   . COPD (chronic obstructive pulmonary disease) ([place])   . Depression   . Diabetes mellitus   . Gout   . Headache(784.0)   . Hypertension   . Shortness of breath   . Stroke [facility])     Medications:  Scheduled:  . chlorhexidine gluconate (MEDLINE KIT)  15 mL Mouth Rinse BID  . clopidogrel  75 mg Oral Daily  . feeding supplement (PRO-STAT SUGAR FREE 64)  30 mL Per Tube 5 X Daily  . insulin aspart  0-9 Units Subcutaneous Q4H  . mouth rinse  15 mL Mouth Rinse 10 times per day  . pantoprazole (PROTONIX) IV  40 mg Intravenous QHS   Infusions:  . sodium  chloride    . cefTRIAXone (ROCEPHIN)  IV Stopped (11/23/17 1129)  . feeding supplement (VITAL HIGH PROTEIN) 1,000 mL (11/23/17 1800)  . fentaNYL infusion INTRAVENOUS 50 mcg/hr (11/24/17 0501)  . heparin 2,000 Units/hr (11/24/17 0503)  . milrinone 0.25 mcg/kg/min (11/23/17 2019)  . propofol (DIPRIVAN) infusion 15 mcg/kg/min (11/23/17 1555)    Assessment: 62 yo M admitted with multisystem organ failure after being bedbound for 5 days PTA.  Noted to have thrombocytopenia with PLTC 90s (baseline back in Sept 300s).  Also noted elevated INR and LFTs on admission (trending down).  Will repeat INR with AM labs>>down to 1.64 this AM  ECHO yesterday revealed new apical  thrombus and EF 10%.  Pharmacy has been asked to initiate anticoagulation with heparin.  2/15 AM: initial heparin level is therapeutic   Goal of Therapy:  Heparin level 0.3-0.7 units/ml Monitor platelets by anticoagulation protocol: Yes   Plan:  Cont heparin at 2000 units/hr 1200 HL  Narda Bonds, PharmD, BCPS Clinical Pharmacist Phone: [phone]

## 2017-11-25 ENCOUNTER — Inpatient Hospital Stay (HOSPITAL_COMMUNITY): Payer: Medicare Other

## 2017-11-25 LAB — GLUCOSE, CAPILLARY
GLUCOSE-CAPILLARY: 135 mg/dL — AB (ref 65–99)
GLUCOSE-CAPILLARY: 141 mg/dL — AB (ref 65–99)
GLUCOSE-CAPILLARY: 144 mg/dL — AB (ref 65–99)
GLUCOSE-CAPILLARY: 150 mg/dL — AB (ref 65–99)
Glucose-Capillary: 116 mg/dL — ABNORMAL HIGH (ref 65–99)
Glucose-Capillary: 127 mg/dL — ABNORMAL HIGH (ref 65–99)

## 2017-11-25 LAB — BASIC METABOLIC PANEL
Anion gap: 15 (ref 5–15)
BUN: 55 mg/dL — AB (ref 6–20)
CHLORIDE: 100 mmol/L — AB (ref 101–111)
CO2: 27 mmol/L (ref 22–32)
CREATININE: 1.56 mg/dL — AB (ref 0.61–1.24)
Calcium: 8.3 mg/dL — ABNORMAL LOW (ref 8.9–10.3)
GFR calc Af Amer: 54 mL/min — ABNORMAL LOW (ref >60)
GFR calc non Af Amer: 47 mL/min — ABNORMAL LOW (ref >60)
GLUCOSE: 115 mg/dL — AB (ref 65–99)
Potassium: 3.5 mmol/L (ref 3.5–5.1)
SODIUM: 142 mmol/L (ref 135–145)

## 2017-11-25 LAB — CBC
HCT: 34.1 % — ABNORMAL LOW (ref 39.0–52.0)
Hemoglobin: 11.1 g/dL — ABNORMAL LOW (ref 13.0–17.0)
MCH: 25 pg — ABNORMAL LOW (ref 26.0–34.0)
MCHC: 32.6 g/dL (ref 30.0–36.0)
MCV: 76.8 fL — ABNORMAL LOW (ref 78.0–100.0)
PLATELETS: 80 10*3/uL — AB (ref 150–400)
RBC: 4.44 MIL/uL (ref 4.22–5.81)
RDW: 19.3 % — ABNORMAL HIGH (ref 11.5–15.5)
WBC: 10.4 10*3/uL (ref 4.0–10.5)

## 2017-11-25 LAB — HEPARIN LEVEL (UNFRACTIONATED): HEPARIN UNFRACTIONATED: 0.68 [IU]/mL (ref 0.30–0.70)

## 2017-11-25 LAB — COOXEMETRY PANEL
Carboxyhemoglobin: 2.3 % — ABNORMAL HIGH (ref 0.5–1.5)
Methemoglobin: 0.9 % (ref 0.0–1.5)
O2 SAT: 89.7 %
TOTAL HEMOGLOBIN: 11.3 g/dL — AB (ref 12.0–16.0)

## 2017-11-25 MED ORDER — FUROSEMIDE 10 MG/ML IJ SOLN
40.0000 mg | Freq: Two times a day (BID) | INTRAMUSCULAR | Status: DC
  Administered 2017-11-25 – 2017-11-29 (×8): 40 mg via INTRAVENOUS
  Filled 2017-11-25 (×8): qty 4

## 2017-11-25 NOTE — Progress Notes (Signed)
ANTICOAGULATION CONSULT NOTE   Pharmacy Consult for Heparin Indication: new apical thrombus  Allergies  Allergen Reactions  . Banana     Sick   . Egg Yolk Nausea And Vomiting  . Kiwi Extract Nausea And Vomiting    Patient Measurements: Height: 6\' 3"  (190.5 cm) Weight: (!) 311 lb 4.6 oz (141.2 kg) IBW/kg (Calculated) : 84.5 Heparin Dosing Weight: 116 kg  Vital Signs: Temp: 100 F (37.8 C) (02/16 0836) Temp Source: Axillary (02/16 0836) BP: 106/77 (02/16 0759) Pulse Rate: 116 (02/16 0759)  Labs: Recent Labs    11/23/17 0524 11/24/17 0400 11/24/17 0446 11/24/17 1200 11/25/17 0500  HGB 12.5*  --  11.9*  --  11.1*  HCT 37.4*  --  35.2*  --  34.1*  PLT 86*  --  80*  --  80*  LABPROT  --   --  19.3*  --   --   INR  --   --  1.64  --   --   HEPARINUNFRC  --  0.50  --  0.60 0.68  CREATININE 2.44*  --  1.97*  --  1.56*  CKTOTAL 381  --   --   --   --     Estimated Creatinine Clearance: 76.4 mL/min (A) (by C-G formula based on SCr of 1.56 mg/dL (H)).  Assessment: 62 yo M admitted with multisystem organ failure after being bedbound for 5 days PTA. 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

## 2017-11-25 NOTE — Progress Notes (Signed)
Subjective:  Remains intubated Following commands today, able to move his lower extremities. Was not able to move his b/l upper extremities today.  3.5 L urine output/24 hrs. Still net positive. No signficicant weight loss Blood pressure stable. Remains in sinus tachycardia with frequent PVC's  Objective:  Vital Signs in the last 24 hours: Temp:  [98.6 F (37 C)-100 F (37.8 C)] 100 F (37.8 C) (02/16 0836) Pulse Rate:  [106-117] 116 (02/16 0759) Resp:  [16-28] 16 (02/16 0759) BP: (104-116)/(54-93) 106/77 (02/16 0759) SpO2:  [97 %-100 %] 97 % (02/16 0900) FiO2 (%):  [30 %] 30 % (02/16 0900) Weight:  [141.2 kg (311 lb 4.6 oz)] 141.2 kg (311 lb 4.6 oz) (02/16 0500)  Intake/Output from previous day: 02/15 0701 - 02/16 0700 In: 2447.2 [I.V.:1184.7; NG/GT:1162.5; IV Piggyback:100] Out: 3550 [Urine:3550] Intake/Output from this shift: Total I/O In: -  Out: 235 [Urine:235]  Physical Exam: Nursing note and vitals reviewed. Constitutional: He appears well-developed.  Morbidly obese. Intubated, sedated   HENT:  Head: Normocephalic.  Eyes: Scleral icterus, conjunctival congestion is present.  Neck: Normal range of motion. Neck supple. JVD present.  Cardiovascular: Normal rate and regular rhythm.  Murmur (II/VI holosystolic murmur RLSB) heard. Distant heart sounds.   Respiratory: Effort normal.  B/l coarse breath sounds   GI: Soft. Bowel sounds are normal. He exhibits distension.  Musculoskeletal: Normal range of motion. He exhibits edema (1+ b/l).  Lymphadenopathy:    He has no cervical adenopathy.  Neurological:  Following commands.  Detailed examination deferred at this time. Skin: Skin is warm and dry.  Psychiatric:  Not assessed     Lab Results: Recent Labs    11/24/17 0446 11/25/17 0500  WBC 9.4 10.4  HGB 11.9* 11.1*  PLT 80* 80*   Recent Labs    11/24/17 0446 11/25/17 0500  NA 138 142  K 3.5 3.5  CL 99* 100*  CO2 28 27  GLUCOSE 125* 115*  BUN 62*  55*  CREATININE 1.97* 1.56*   No results for input(s): TROPONINI in the last 72 hours.  Invalid input(s): CK, MB Hepatic Function Panel Recent Labs    11/24/17 0446  PROT 5.3*  ALBUMIN 2.4*  AST 525*  ALT 646*  ALKPHOS 148*  BILITOT 4.5*   Echocardiogram 11/23/2017 Study Conclusions   - Left ventricle: The cavity size was moderately dilated. Wall   thickness was increased in a pattern of mild LVH. Systolic   function was severely reduced. The estimated ejection fraction   was in the range of 10% to 15%. Diffuse hypokinesis. Akinesis of   the apical myocardium. The study is not technically sufficient to   allow evaluation of LV diastolic function. Acoustic contrast   opacification revealed a large, apicalthrombusassociated with an   akinetic segment. - Ventricular septum: The contour showed diastolic flattening and   systolic flattening. These changes are consistent with RV volume   and pressure overload. - Aortic valve: There was trivial regurgitation. - Mitral valve: There was mild to moderate regurgitation. - Left atrium: The atrium was severely dilated. - Right ventricle: The cavity size was severely dilated. Systolic   function was severely reduced. - Right atrium: The atrium was severely dilated. - Atrial septum: No defect or patent foramen ovale was identified. - Tricuspid valve: There was moderate regurgitation.  Assessment: Acute systolic heart failure, resolving cardiogenic shock Minimal troponin elevation, and rapid clearance of lactic acid with management of sepsis makes primary cardiogenic shock less likely. He was tested  positive for cocaine this admission. I suspect this was a combination of cocaine-induced cardiomyopathy, sepsis, in the setting of underlying systolic and diastolic heart failure from ischemic cardiomyopathy. His prognosis remains guarded at this time. He has remained normotensive for the most part during this hospital admission. Mild  tachycardia could suggest impending shock. Lactic acid and creatinine have improved. Good urine output on low dose diuretics.  LV apical thrombus Acute Respiratory failure Sepsis, likely due to pneumonia AKI/CKD: Resolving. Cr 1.56 today Lactic acid elevation, resolved Elevated liver enzymes: Likely shock liver. Now improving Mild troponin elevation: Type 2 MI CAD status post  primary PCI LCx PDA Synergy DES 2.5 X 16 mm 07/09/2017 H/o Hypertension Type 2 DM Hyperlipidemia Morbid obesity Tobacco abuse Polysubstance abuse, including cocaine positive this admission H/o CVA   Recommendations:  Continue renally dosed milrinone. Recommend lasix 40 mg IV bid.  Continue heparin, bridge to warfarin when possible. Continue plavix. Remains tachycardic, which is compensatory for his acute systolic heart failure. Would not start beta blockers at this time. Could add low dose bidil or captopril on 11/26/2017. With his morbid obesity, history of medication noncompliance, h/o CVA, and most importantly substance abuse with urine positive for cocaine, he is not a candidate for any advanced heart failure therapy. I would not recommend escalation for his cardiac care beyond management with temporary inotropic support. Continue aspirin 81 mg, Brilinta 90 mg twice a day. S/p primary PCI LCx PDA Synergy DES 2.5 X 16 mm ASA 81 mg lifelong, brilinta 90 mg bid for at least 1 year Baseline home medications include Coreg 25 mg bid, spironolactone 25 mg daily, Bidil 20-37.5 mg tid. Could resume as allowed by his hemodynamic status and renal funcion in the setting of sepsis. Starting with Bidil.  I discussed his current cardiac condition and prognosis with this part of her Boneta Lucks on 11/24/2017.  She is not his next of kin and not matted to him.  His next of kin includes a son who lives in town, a son lives in [place], and a sister.  It appears that there are strained relationships which could make decision making  complicated, if he is unable to be weaned off ventilator and unable to make his decisions.  Boneta Lucks mentions that she is going to contact his sister.    LOS: 4 days    Dequante Tremaine J Kenzie Thoreson 11/25/2017, 11:07 AM  Elvia Aydin Emiliano Dyer, MD [facility] Pager: [phone] Office: [phone] If no answer Cell [phone]

## 2017-11-25 NOTE — Progress Notes (Signed)
PULMONARY / CRITICAL CARE MEDICINE   Name: Todd Sullivan MRN: [id number] DOB: 03-02-1956    ADMISSION DATE:  11/23/2017 CONSULTATION DATE:  11/27/2017  REFERRING MD: Dr. Wilkie Aye  CHIEF COMPLAINT:  SOB  HISTORY OF PRESENT ILLNESS:   HPI obtained from medical chart review as patient is currently intubated and sedated on mechanical ventilation.  62 year old male with past medical history significant for COPD, asthma, diabetes, CAD status post STEMI/ stent 06/2017, hypertension, and CVA who presented via EMS with complaints of shortness of breath and altered mental status. His significant other reports she has not done well since he MI 3 months ago.  Also reports he has not taken his medications for the last 5 days.  He has not been out of bed for the last 5 days.  Found him after he fell out of bed called 911 and was transported to [facility].  On arrival to the emergency room he was unable to protect his airway and was intubated.  He is to have a CT of his head to rule out CVA and/or trauma since he has had a history of CVA in the past.  On my arrival in the emergency room to trauma a he was sedated with propofol and going to CT scan for evaluation.  Neuro exam was difficult due to high level of sedation.  Difficult due to high level sedation  Note he had profound hypoglycemia otherwise not on insulin but he is on metformin.  He had to be started on dextrose 10% drip to maintain his glucose levels greater than 90.  Labs noted for BUN 53, sCr 3.18 (baseline 1.5-1.6), lactic acid 11.99 -> 9.52, BNP 1886, AST 1073, ALT 804, t. Bili 5.4, WBC 14.4, Plts 107, TSH 0.888, initial CXR showing pulmonary edema   We will admit to the intensive care unit continue to explore laboratory data evaluate CT scan of the head.  Treat underlying lactic acidosis, evaluate for infectious process and cardiology is to evaluate.  SUBJECTIVE / Interval Events:  Remains ventilated Milrinone started and  tolerating Negative volume status/24 hours, remains 384 cc positive for the hospitalization Renal function improving  VITAL SIGNS: BP 106/77   Pulse (!) 116   Temp 100 F (37.8 C) (Axillary)   Resp 16   Ht 6\' 3"  (1.905 m)   Wt (!) 141.2 kg (311 lb 4.6 oz)   SpO2 97%   BMI 38.91 kg/m   HEMODYNAMICS:    VENTILATOR SETTINGS: Vent Mode: PSV;CPAP FiO2 (%):  [30 %] 30 % Set Rate:  [16 bmp] 16 bmp Vt Set:  [680 mL] 680 mL PEEP:  [5 cmH20] 5 cmH20 Pressure Support:  [12 cmH20-14 cmH20] 14 cmH20 Plateau Pressure:  [20 cmH20-23 cmH20] 20 cmH20  INTAKE / OUTPUT: I/O last 3 completed shifts: In: 3469.1 [I.V.:1656.6; NG/GT:1712.5; IV Piggyback:100] Out: 4500 [Urine:4500]  PHYSICAL EXAMINATION: General: Obese man, ill-appearing, sedated, intubated Neuro:.  Wakes to voice, opens eyes, intermittently follows commands a bit sleepier than 2/15 HEENT: Right IJ CVC in place ET tube in place Cardiovascular: Distant, regular, heart rate 120s Lungs: coarse bilaterally, very decreased at both bases  musculoskeletal: No deformity Skin: Trace lower extremity edema, improved  LABS:  BMET Recent Labs  Lab 11/23/17 0524 11/24/17 0446 11/25/17 0500  NA 137 138 142  K 3.2* 3.5 3.5  CL 96* 99* 100*  CO2 27 28 27   BUN 65* 62* 55*  CREATININE 2.44* 1.97* 1.56*  GLUCOSE 130* 125* 115*    Electrolytes Recent  Labs  Lab 12-17-2017 0823 11/22/17 0430 11/23/17 0524 11/24/17 0446 11/25/17 0500  CALCIUM 8.9 8.8* 8.4* 8.4* 8.3*  MG 1.8 2.0  --  1.7  --   PHOS 5.4* 3.7  --   --   --     CBC Recent Labs  Lab 11/23/17 0524 11/24/17 0446 11/25/17 0500  WBC 10.3 9.4 10.4  HGB 12.5* 11.9* 11.1*  HCT 37.4* 35.2* 34.1*  PLT 86* 80* 80*    Coag's Recent Labs  Lab 17-Dec-2017 0823 11/24/17 0446  APTT 37*  --   INR 2.51 1.64    Sepsis Markers Recent Labs  Lab 12/17/17 0823 2017/12/17 1053 11/22/17 1430 11/22/17 1715  LATICACIDVEN 5.8* 3.9* 1.9 1.9  PROCALCITON 1.85  --   --    --     ABG Recent Labs  Lab 2017/12/17 0520 December 17, 2017 0849 11/22/17 0344  PHART 7.273* 7.332* 7.454*  PCO2ART 46.3 42.6 40.3  PO2ART 216.0* 70.0* 172.0*    Liver Enzymes Recent Labs  Lab 11/22/17 0430 11/23/17 0524 11/24/17 0446  AST 1,769* 993* 525*  ALT 1,173* 947* 646*  ALKPHOS 167* 166* 148*  BILITOT 5.3* 4.7* 4.5*  ALBUMIN 2.9* 2.5* 2.4*    Cardiac Enzymes Recent Labs  Lab Dec 17, 2017 0823 12/17/17 1445 11/22/17 0451  TROPONINI 0.05* 0.06* 0.07*    Glucose Recent Labs  Lab 11/24/17 1200 11/24/17 1521 11/24/17 2018 11/24/17 2336 11/25/17 0314 11/25/17 0830  GLUCAP 127* 113* 132* 113* 141* 116*    Imaging Dg Chest Port 1 View  Result Date: 11/25/2017 CLINICAL DATA:  Endotracheal tube EXAM: PORTABLE CHEST 1 VIEW COMPARISON:  11/24/2017 FINDINGS: Endotracheal tube 3 cm above the carina. NG enters the stomach. Right jugular central venous catheter tip in the SVC. Pulmonary vascular congestion unchanged. Bibasilar atelectasis and small effusions unchanged. IMPRESSION: Endotracheal tube in good position Pulmonary vascular congestion compatible with fluid overload unchanged Bibasilar atelectasis and small effusions unchanged. Electronically Signed   By: Marlan Palau M.D.   On: 11/25/2017 08:19     STUDIES:  17-Dec-2017 CT of the head>>No acute intracranial abnormalities CXR 2/15 >>Progression of bibasilar airspace disease and small pleural effusion.   CULTURES: 12-17-2017 blood cultures x2>>NGTD 2017-12-17 sputum culture>>NGTD December 17, 2017 urine culture>>NGTD 2017-12-17 respiratory>> Candida albicans 12/17/17 pro-calcitonin>>1.85 12/17/17 respiratory virus panel>>Negative   ANTIBIOTICS: 2/12 Vancomycin>> 2/13 2/12 Zosyn>>2/13 2/13 Levaquin>>2/14 2/14 Ceftriaxone >>   SIGNIFICANT EVENTS: 12/17/17 intubated for respiratory failure  LINES/TUBES: Dec 17, 2017 endotracheal tube>> 12/17/2017 right internal jugular CVL>>  DISCUSSION: 63 year old morbidly obese  male who modality has been smoking recently states he is on NicoDerm patches.  He is noted to be and have been declining over the past 3 months following cardiac cath MI and stent placement.  He has not taken his medication for the last 5 days nor has he been out of bed for the last 5 days.  Significant other noted that he got up and fell during the night EMS was activated his transport to the emergency room he was unable to protect his airway and was intubated.  ASSESSMENT / PLAN:  PULMONARY A: Vent dependent respiratory failure secondary to inability to protect airway, L>R infiltrates Cardiogenic pulmonary edema Probable CAP left lower lobe P:   Continue current ventilator support. Work on pressure support ventilation, lightening sedation Follow chest x-ray VAP prevention orders Continue diuresis-suspect cardiac edema is a significant contributor to respiratory failure   CARDIOVASCULAR A:  Coronary artery disease history with MI 3 (9/18) primary PCI to dominant left circumflex and PDA  with synergy drug-eluting stent.   Ischemic CM, possible contribution viral or septic CM LV thrombus  P:  Appreciate cardiology input Dosing furosemide daily Continue milrinone for inotropic support, appears to be helping with volume mobilization Plavix, heparin gtt Add pressors if needed to facilitate diuresis Transition to coumadin once acute issues stabilize   RENAL A:   Acute renal failure with last known creatinine 1.87. Improving. Presume cardiorenal syndrome  P:   Follow urine output with current level of diuresis and milrinone Follow BMP Adjust diuretics daily as indicated  GASTROINTESTINAL A:   Transaminitis.  Hepatitis panel negative, right upper quadrant ultrasound without any evidence of acute cholecystitis.  Suspect shock liver.  Improving Morbid obesity GI protection P:   PPI as ordered Tube feeding Follow LFT and coags  HEMATOLOGIC A:  Large apical thrombus seen on Echo  2/14 with akinetic apex.   P:  Follow PT/INR Continue heparin as ordered Convert to Coumadin when stable from this acute episode Follow CBC  INFECTIOUS A:   Suspected left lower lobe CAP -Influenza and RVP negative P:   Complete ceftriaxone course, day 5 of 7 F/u on blood, tracheal and urine cultures   ENDOCRINE CBG (last 3)  Recent Labs    11/24/17 2336 11/25/17 0314 11/25/17 0830  GLUCAP 113* 141* 116*    A:   T2DM, A1c 6.5.  Initial hypoglycemia, resolved  P:   Enteral DM meds on hold SSI per protocol  NEUROLOGIC A:   Acute encephalopathy, toxic metabolic +possibly substances Cocaine abuse Sedation for MV P:   RASS goal: -1 Continue current sedation,lighten as able   FAMILY  - Updates: Girlfriend at bedside 2/16  Independent CC time 33 minutes   Levy Pupa, MD, PhD 11/25/2017, 10:02 AM [facility] [phone] or if no answer [phone]

## 2017-11-26 LAB — BASIC METABOLIC PANEL
ANION GAP: 12 (ref 5–15)
BUN: 51 mg/dL — ABNORMAL HIGH (ref 6–20)
CALCIUM: 8.3 mg/dL — AB (ref 8.9–10.3)
CO2: 30 mmol/L (ref 22–32)
Chloride: 102 mmol/L (ref 101–111)
Creatinine, Ser: 1.48 mg/dL — ABNORMAL HIGH (ref 0.61–1.24)
GFR calc Af Amer: 58 mL/min — ABNORMAL LOW (ref >60)
GFR calc non Af Amer: 50 mL/min — ABNORMAL LOW (ref >60)
GLUCOSE: 126 mg/dL — AB (ref 65–99)
Potassium: 3.4 mmol/L — ABNORMAL LOW (ref 3.5–5.1)
Sodium: 144 mmol/L (ref 135–145)

## 2017-11-26 LAB — HEPARIN LEVEL (UNFRACTIONATED)
HEPARIN UNFRACTIONATED: 0.62 [IU]/mL (ref 0.30–0.70)
Heparin Unfractionated: 0.85 IU/mL — ABNORMAL HIGH (ref 0.30–0.70)

## 2017-11-26 LAB — CBC
HEMATOCRIT: 35.3 % — AB (ref 39.0–52.0)
Hemoglobin: 11 g/dL — ABNORMAL LOW (ref 13.0–17.0)
MCH: 24.3 pg — ABNORMAL LOW (ref 26.0–34.0)
MCHC: 31.2 g/dL (ref 30.0–36.0)
MCV: 78.1 fL (ref 78.0–100.0)
Platelets: 87 10*3/uL — ABNORMAL LOW (ref 150–400)
RBC: 4.52 MIL/uL (ref 4.22–5.81)
RDW: 20.2 % — AB (ref 11.5–15.5)
WBC: 10 10*3/uL (ref 4.0–10.5)

## 2017-11-26 LAB — GLUCOSE, CAPILLARY
GLUCOSE-CAPILLARY: 132 mg/dL — AB (ref 65–99)
GLUCOSE-CAPILLARY: 107 mg/dL — AB (ref 65–99)
GLUCOSE-CAPILLARY: 104 mg/dL — AB (ref 65–99)
Glucose-Capillary: 112 mg/dL — ABNORMAL HIGH (ref 65–99)
Glucose-Capillary: 121 mg/dL — ABNORMAL HIGH (ref 65–99)
Glucose-Capillary: 124 mg/dL — ABNORMAL HIGH (ref 65–99)

## 2017-11-26 LAB — CULTURE, BLOOD (ROUTINE X 2)
CULTURE: NO GROWTH
Culture: NO GROWTH
Culture: NO GROWTH
Special Requests: ADEQUATE
Special Requests: ADEQUATE
Special Requests: ADEQUATE

## 2017-11-26 MED ORDER — POTASSIUM CHLORIDE 20 MEQ PO PACK
40.0000 meq | PACK | Freq: Two times a day (BID) | ORAL | Status: AC
  Administered 2017-11-26 (×2): 40 meq
  Filled 2017-11-26 (×2): qty 2

## 2017-11-26 MED ORDER — ISOSORB DINITRATE-HYDRALAZINE 20-37.5 MG PO TABS
0.5000 | ORAL_TABLET | Freq: Two times a day (BID) | ORAL | Status: DC
  Administered 2017-11-26 (×2): 0.5 via ORAL
  Filled 2017-11-26 (×3): qty 0.5

## 2017-11-26 NOTE — Progress Notes (Addendum)
Subjective:  Remains intubated Appears sedated today. Not following commands.   3.5 L urine output/24 hrs.  Cr continues to improve. 1.48 today Blood pressure stable. Remains in sinus tachycardia with frequent PVC's  Objective:  Vital Signs in the last 24 hours: Temp:  [97.5 F (36.4 C)-100.1 F (37.8 C)] 99.8 F (37.7 C) (02/17 1141) Pulse Rate:  [110-123] 122 (02/17 1129) Resp:  [14-26] 25 (02/17 1129) BP: (104-135)/(65-108) 124/74 (02/17 1129) SpO2:  [95 %-99 %] 97 % (02/17 1130) FiO2 (%):  [30 %] 30 % (02/17 1130) Weight:  [136.7 kg (301 lb 5.9 oz)] 136.7 kg (301 lb 5.9 oz) (02/17 0458)  Intake/Output from previous day: 02/16 0701 - 02/17 0700 In: 2317.7 [I.V.:1067.7; NG/GT:1150; IV Piggyback:100] Out: 3475 [Urine:3475] Intake/Output from this shift: Total I/O In: -  Out: 1200 [Urine:1200]  Net -1.1 L  Physical Exam: Nursing note and vitals reviewed. Constitutional: He appears well-developed.  Morbidly obese. Intubated, sedated   HENT:  Head: Normocephalic.  Eyes: Scleral icterus, conjunctival congestion is present.  Neck: Normal range of motion. Neck supple. JVD present.  Cardiovascular: Normal rate and regular rhythm.  Murmur (II/VI holosystolic murmur RLSB) heard. Distant heart sounds.   Respiratory: Effort normal.  B/l coarse breath sounds   GI: Soft. Bowel sounds are normal. He exhibits distension.  Musculoskeletal: Normal range of motion. He exhibits edema (1+ b/l).  Lymphadenopathy:    He has no cervical adenopathy.  Neurological:  Following commands.  Detailed examination deferred at this time. Skin: Skin is warm and dry.  Psychiatric:  Not assessed     Lab Results: Recent Labs    11/25/17 0500 11/26/17 0431  WBC 10.4 10.0  HGB 11.1* 11.0*  PLT 80* 87*   Recent Labs    11/25/17 0500 11/26/17 0431  NA 142 144  K 3.5 3.4*  CL 100* 102  CO2 27 30  GLUCOSE 115* 126*  BUN 55* 51*  CREATININE 1.56* 1.48*   No results for input(s):  TROPONINI in the last 72 hours.  Invalid input(s): CK, MB Hepatic Function Panel Recent Labs    11/24/17 0446  PROT 5.3*  ALBUMIN 2.4*  AST 525*  ALT 646*  ALKPHOS 148*  BILITOT 4.5*   Echocardiogram 11/23/2017 Study Conclusions   - Left ventricle: The cavity size was moderately dilated. Wall   thickness was increased in a pattern of mild LVH. Systolic   function was severely reduced. The estimated ejection fraction   was in the range of 10% to 15%. Diffuse hypokinesis. Akinesis of   the apical myocardium. The study is not technically sufficient to   allow evaluation of LV diastolic function. Acoustic contrast   opacification revealed a large, apicalthrombusassociated with an   akinetic segment. - Ventricular septum: The contour showed diastolic flattening and   systolic flattening. These changes are consistent with RV volume   and pressure overload. - Aortic valve: There was trivial regurgitation. - Mitral valve: There was mild to moderate regurgitation. - Left atrium: The atrium was severely dilated. - Right ventricle: The cavity size was severely dilated. Systolic   function was severely reduced. - Right atrium: The atrium was severely dilated. - Atrial septum: No defect or patent foramen ovale was identified. - Tricuspid valve: There was moderate regurgitation.  Assessment: Acute systolic heart failure, resolving cardiogenic shock Minimal troponin elevation, and rapid clearance of lactic acid with management of sepsis makes primary cardiogenic shock less likely. He was tested positive for cocaine this admission. I suspect this  was a combination of cocaine-induced cardiomyopathy, sepsis, in the setting of underlying systolic and diastolic heart failure from ischemic cardiomyopathy. His prognosis remains guarded at this time. He has remained normotensive for the most part during this hospital admission. Mild tachycardia could suggest impending shock. Lactic acid and  creatinine have improved. Good urine output on low dose diuretics.  LV apical thrombus Acute Respiratory failure Sepsis, likely due to pneumonia AKI/CKD: Resolving. Cr 1.48 today Hypokalemia K 3.4 Lactic acid elevation, resolved Elevated liver enzymes: Likely shock liver. Now improving Mild troponin elevation: Type 2 MI CAD status post  primary PCI LCx PDA Synergy DES 2.5 X 16 mm 07/09/2017 H/o Hypertension Type 2 DM Hyperlipidemia Morbid obesity Tobacco abuse Polysubstance abuse, including cocaine positive this admission H/o CVA   Recommendations:  Continue renally dosed milrinone. Continue lasix 40 mg IV bid.  Adding low dose bidil 1/2 cap of 20-37.5 bid for afterload reduction. Will have to watch for hypotension/worsening tachycardia Klor packet 40 mEq X 2. Keep K around 4. Continue heparin, bridge to warfarin when possible. Continue plavix. Remains tachycardic, which is compensatory for his acute systolic heart failure. Would not start beta blockers at this time.  With his morbid obesity, history of medication noncompliance, h/o CVA, and most importantly substance abuse with urine positive for cocaine, he is not a candidate for any advanced heart failure therapy. I would not recommend escalation for his cardiac care beyond management with temporary inotropic support.   I discussed his current cardiac condition and prognosis with this part of her Jeannine on 11/24/2017.  She is not his next of kin and not married to him.  His next of kin includes a son who lives in town, a son lives in [place], and a sister.     LOS: 5 days    Aletheia Tangredi J Amyrie Illingworth 11/26/2017, 12:10 PM  Kamani Lewter Emiliano Dyer, MD [facility] [place] Pager: [phone] Office: [phone] If no answer Cell [phone]

## 2017-11-26 NOTE — Progress Notes (Signed)
ANTICOAGULATION CONSULT NOTE   Pharmacy Consult for Heparin Indication: new apical thrombus  Allergies  Allergen Reactions  . Banana     Sick   . Egg Yolk Nausea And Vomiting  . Kiwi Extract Nausea And Vomiting    Patient Measurements: Height: 6\' 3"  (190.5 cm) Weight: (!) 301 lb 5.9 oz (136.7 kg) IBW/kg (Calculated) : 84.5 Heparin Dosing Weight: 116 kg  Vital Signs: Temp: 99.4 F (37.4 C) (02/17 0419) Temp Source: Oral (02/17 0419) BP: 119/80 (02/17 0000) Pulse Rate: 118 (02/17 0328)  Labs: Recent Labs    11/23/17 0524  11/24/17 0446 11/24/17 1200 11/25/17 0500 11/26/17 0431  HGB 12.5*  --  11.9*  --  11.1* 11.0*  HCT 37.4*  --  35.2*  --  34.1* 35.3*  PLT 86*  --  80*  --  80* PENDING  LABPROT  --   --  19.3*  --   --   --   INR  --   --  1.64  --   --   --   HEPARINUNFRC  --    < >  --  0.60 0.68 0.85*  CREATININE 2.44*  --  1.97*  --  1.56*  --   CKTOTAL 381  --   --   --   --   --    < > = values in this interval not displayed.    Estimated Creatinine Clearance: 75.1 mL/min (A) (by C-G formula based on SCr of 1.56 mg/dL (H)).  Assessment: 62 y.o. male with CHF and apical thrombus for heparin  Goal of Therapy:  Heparin level 0.3-0.7 units/ml Monitor platelets by anticoagulation protocol: Yes   Plan:  Decrease Heparin 1750 units/hr  Geannie Risen, PharmD, BCPS  11/26/2017 5:18 AM

## 2017-11-26 NOTE — Progress Notes (Signed)
PULMONARY / CRITICAL CARE MEDICINE   Name: Todd Sullivan MRN: [id number] DOB: 1956-09-10    ADMISSION DATE:  12/02/2017 CONSULTATION DATE:  12-02-2017  REFERRING MD: Dr. Wilkie Aye  CHIEF COMPLAINT:  SOB  HISTORY OF PRESENT ILLNESS:   HPI obtained from medical chart review as patient is currently intubated and sedated on mechanical ventilation.  62 year old male with past medical history significant for COPD, asthma, diabetes, CAD status post STEMI/ stent 06/2017, hypertension, and CVA who presented via EMS with complaints of shortness of breath and altered mental status. His significant other reports she has not done well since he MI 3 months ago.  Also reports he has not taken his medications for the last 5 days.  He has not been out of bed for the last 5 days.  Found him after he fell out of bed called 911 and was transported to [facility].  On arrival to the emergency room he was unable to protect his airway and was intubated.  He is to have a CT of his head to rule out CVA and/or trauma since he has had a history of CVA in the past.  On my arrival in the emergency room to trauma a he was sedated with propofol and going to CT scan for evaluation.  Neuro exam was difficult due to high level of sedation.  Difficult due to high level sedation  Note he had profound hypoglycemia otherwise not on insulin but he is on metformin.  He had to be started on dextrose 10% drip to maintain his glucose levels greater than 90.  Labs noted for BUN 53, sCr 3.18 (baseline 1.5-1.6), lactic acid 11.99 -> 9.52, BNP 1886, AST 1073, ALT 804, t. Bili 5.4, WBC 14.4, Plts 107, TSH 0.888, initial CXR showing pulmonary edema   We will admit to the intensive care unit continue to explore laboratory data evaluate CT scan of the head.  Treat underlying lactic acidosis, evaluate for infectious process and cardiology is to evaluate.  SUBJECTIVE / Interval Events:  Tolerating milrinone and diuresis, now net -760  cc A bit more sedated this morning Some emesis reported overnight, now back on tube feeding Some blood from his subglottic suction  VITAL SIGNS: BP 117/65   Pulse (!) 119   Temp 99.2 F (37.3 C) (Axillary)   Resp 16   Ht 6\' 3"  (1.905 m)   Wt (!) 136.7 kg (301 lb 5.9 oz)   SpO2 97%   BMI 37.67 kg/m   HEMODYNAMICS:    VENTILATOR SETTINGS: Vent Mode: PSV;CPAP FiO2 (%):  [30 %] 30 % Set Rate:  [16 bmp] 16 bmp Vt Set:  [680 mL] 680 mL PEEP:  [5 cmH20] 5 cmH20 Pressure Support:  [14 cmH20] 14 cmH20 Plateau Pressure:  [20 cmH20-21 cmH20] 21 cmH20  INTAKE / OUTPUT: I/O last 3 completed shifts: In: 3911.3 [I.V.:1898.8; NG/GT:1912.5; IV Piggyback:100] Out: 4575 [Urine:4575]  PHYSICAL EXAMINATION: General: Obese man, ill-appearing, sedated, intubated Neuro:.  Less responsive than over the last few days, tries to open eyes and turns head to voice HEENT: Right IJ CVC in place ET tube in place Cardiovascular: Distant, regular, heart rate 110 Lungs: coarse bilaterally, decreased at both bases musculoskeletal: No deformity Skin: Trace lower extremity edema, improving  LABS:  BMET Recent Labs  Lab 11/24/17 0446 11/25/17 0500 11/26/17 0431  NA 138 142 144  K 3.5 3.5 3.4*  CL 99* 100* 102  CO2 28 27 30   BUN 62* 55* 51*  CREATININE 1.97* 1.56* 1.48*  GLUCOSE 125* 115* 126*    Electrolytes Recent Labs  Lab 09-Dec-2017 0823 11/22/17 0430  11/24/17 0446 11/25/17 0500 11/26/17 0431  CALCIUM 8.9 8.8*   < > 8.4* 8.3* 8.3*  MG 1.8 2.0  --  1.7  --   --   PHOS 5.4* 3.7  --   --   --   --    < > = values in this interval not displayed.    CBC Recent Labs  Lab 11/24/17 0446 11/25/17 0500 11/26/17 0431  WBC 9.4 10.4 10.0  HGB 11.9* 11.1* 11.0*  HCT 35.2* 34.1* 35.3*  PLT 80* 80* 87*    Coag's Recent Labs  Lab 12/09/17 0823 11/24/17 0446  APTT 37*  --   INR 2.51 1.64    Sepsis Markers Recent Labs  Lab 12-09-17 0823 December 09, 2017 1053 11/22/17 1430  11/22/17 1715  LATICACIDVEN 5.8* 3.9* 1.9 1.9  PROCALCITON 1.85  --   --   --     ABG Recent Labs  Lab 12/09/17 0520 09-Dec-2017 0849 11/22/17 0344  PHART 7.273* 7.332* 7.454*  PCO2ART 46.3 42.6 40.3  PO2ART 216.0* 70.0* 172.0*    Liver Enzymes Recent Labs  Lab 11/22/17 0430 11/23/17 0524 11/24/17 0446  AST 1,769* 993* 525*  ALT 1,173* 947* 646*  ALKPHOS 167* 166* 148*  BILITOT 5.3* 4.7* 4.5*  ALBUMIN 2.9* 2.5* 2.4*    Cardiac Enzymes Recent Labs  Lab 12-09-2017 0823 December 09, 2017 1445 11/22/17 0451  TROPONINI 0.05* 0.06* 0.07*    Glucose Recent Labs  Lab 11/25/17 1241 11/25/17 1531 11/25/17 1939 11/25/17 2355 11/26/17 0418 11/26/17 0753  GLUCAP 150* 135* 127* 144* 132* 107*    Imaging No results found.   STUDIES:  Dec 09, 2017 CT of the head>>No acute intracranial abnormalities CXR 2/15 >>Progression of bibasilar airspace disease and small pleural effusion.   CULTURES: 12/09/2017 blood cultures x2>>NGTD 12-09-2017 sputum culture>>NGTD 09-Dec-2017 urine culture>>NGTD 09-Dec-2017 respiratory>> Candida albicans 12/09/17 pro-calcitonin>>1.85 09-Dec-2017 respiratory virus panel>>Negative   ANTIBIOTICS: 2/12 Vancomycin>> 2/13 2/12 Zosyn>>2/13 2/13 Levaquin>>2/14 2/14 Ceftriaxone >>   SIGNIFICANT EVENTS: Dec 09, 2017 intubated for respiratory failure  LINES/TUBES: 12/09/17 endotracheal tube>> 12/09/2017 right internal jugular CVL>>  DISCUSSION: 62 year old morbidly obese male who modality has been smoking recently states he is on NicoDerm patches.  He is noted to be and have been declining over the past 3 months following cardiac cath MI and stent placement.  He has not taken his medication for the last 5 days nor has he been out of bed for the last 5 days.  Significant other noted that he got up and fell during the night EMS was activated his transport to the emergency room he was unable to protect his airway and was intubated.  ASSESSMENT /  PLAN:  PULMONARY A: Vent dependent respiratory failure secondary to inability to protect airway, L>R infiltrates Cardiogenic pulmonary edema Probable CAP left lower lobe P:   Continue current ventilator support Work on pressure support ventilation, will need to lighten sedation also need further volume removal Follow chest x-ray Continue diuresis, volume optimization   CARDIOVASCULAR A:  Coronary artery disease history with MI 3 (9/18) primary PCI to dominant left circumflex and PDA with synergy drug-eluting stent.   Ischemic CM, possible contribution viral or septic CM LV thrombus  P:  Appreciate cardiology assistance Hold off on beta-blocker at this time Tolerating diuretics, continue same dose of furosemide 2/17 Continue milrinone for inotropic support, helping with volume mobilization Continue Plavix, heparin drip Off pressors currently, re-add if this will  help facilitate diuresis Transition to Coumadin once acute issues stabilize  RENAL A:   Acute renal failure with last known creatinine 1.87. Improving. Presume cardiorenal syndrome  P:   Follow urine output, BMP on current milrinone and diuretics Adjusting diuretics daily depending on volume status and renal function  GASTROINTESTINAL A:   Transaminitis.  Hepatitis panel negative, right upper quadrant ultrasound without any evidence of acute cholecystitis.  Suspect shock liver.  Improving Morbid obesity GI protection P:   Continue PPI Continue tube feeding as tolerated, note he had emesis 2/16 Follow LFT, coags  HEMATOLOGIC A:  Large apical thrombus seen on Echo 2/14 with akinetic apex.   P:  Continue heparin as ordered Convert to Coumadin when stable from the acute episode Follow CBC  INFECTIOUS A:   Suspected left lower lobe CAP -Influenza and RVP negative P:   Complete ceftriaxone course, day 6 of 7 Follow cultures to completion   ENDOCRINE CBG (last 3)  Recent Labs    11/25/17 2355  11/26/17 0418 11/26/17 0753  GLUCAP 144* 132* 107*    A:   T2DM, A1c 6.5.  Initial hypoglycemia, resolved  P:   Enteral diabetic medications on hold SSI per protocol  NEUROLOGIC A:   Acute encephalopathy, toxic metabolic +possibly substances Cocaine abuse Sedation for MV P:   RASS goal: -1 Lighten sedation as able   FAMILY  - Updates: Girlfriend at bedside 2/17  Independent CC time 34 minutes   Levy Pupa, MD, PhD 11/26/2017, 8:52 AM [facility] [phone] or if no answer [phone]

## 2017-11-26 NOTE — Progress Notes (Signed)
Pharmacist Heart Failure Core Measure Documentation  Assessment: Todd Sullivan has an EF documented as 10 - 15 % on 11/23/17 by ECHO.  Rationale: Heart failure patients with left ventricular systolic dysfunction (LVSD) and an EF < 40% should be prescribed an angiotensin converting enzyme inhibitor (ACEI) or angiotensin receptor blocker (ARB) at discharge unless a contraindication is documented in the medical record.  This patient is not currently on an ACEI or ARB for HF.  This note is being placed in the record in order to provide documentation that a contraindication to the use of these agents is present for this encounter.  ACE Inhibitor or Angiotensin Receptor Blocker is contraindicated (specify all that apply)  []   ACEI allergy AND ARB allergy []   Angioedema []   Moderate or severe aortic stenosis []   Hyperkalemia [x]   Hypotension []   Renal artery stenosis []   Worsening renal function, preexisting renal disease or dysfunction   Bertram Millard 11/26/2017 10:04 AM

## 2017-11-26 NOTE — Progress Notes (Signed)
ANTICOAGULATION CONSULT NOTE   Pharmacy Consult for Heparin Indication: new apical thrombus  Allergies  Allergen Reactions  . Banana     Sick   . Egg Yolk Nausea And Vomiting  . Kiwi Extract Nausea And Vomiting    Patient Measurements: Height: 6\' 3"  (190.5 cm) Weight: (!) 301 lb 5.9 oz (136.7 kg) IBW/kg (Calculated) : 84.5 Heparin Dosing Weight: 116 kg  Vital Signs: Temp: 99.8 F (37.7 C) (02/17 1141) Temp Source: Oral (02/17 1141) BP: 120/81 (02/17 1300) Pulse Rate: 122 (02/17 1300)  Labs: Recent Labs    11/24/17 0446  11/25/17 0500 11/26/17 0431 11/26/17 1306  HGB 11.9*  --  11.1* 11.0*  --   HCT 35.2*  --  34.1* 35.3*  --   PLT 80*  --  80* 87*  --   LABPROT 19.3*  --   --   --   --   INR 1.64  --   --   --   --   HEPARINUNFRC  --    < > 0.68 0.85* 0.62  CREATININE 1.97*  --  1.56* 1.48*  --    < > = values in this interval not displayed.    Estimated Creatinine Clearance: 79.1 mL/min (A) (by C-G formula based on SCr of 1.48 mg/dL (H)).  Assessment: 62 y.o. male with CHF and apical thrombus for heparin  Hep lvl within goal at 1750 units/hr  Goal of Therapy:  Heparin level 0.3-0.7 units/ml Monitor platelets by anticoagulation protocol: Yes   Plan:  Continue heparin 1750 units/hr Daily HL CBC  Isaac Bliss, PharmD, BCPS, BCCCP Clinical Pharmacist Clinical phone for 11/26/2017 from 7a-3:30p: [phone] If after 3:30p, please call main pharmacy at: [phone] 11/26/2017 2:41 PM

## 2017-11-27 ENCOUNTER — Inpatient Hospital Stay (HOSPITAL_COMMUNITY): Payer: Medicare Other

## 2017-11-27 LAB — COMPREHENSIVE METABOLIC PANEL
ALT: 261 U/L — ABNORMAL HIGH (ref 17–63)
ANION GAP: 12 (ref 5–15)
AST: 129 U/L — AB (ref 15–41)
Albumin: 2.4 g/dL — ABNORMAL LOW (ref 3.5–5.0)
Alkaline Phosphatase: 124 U/L (ref 38–126)
BILIRUBIN TOTAL: 3.7 mg/dL — AB (ref 0.3–1.2)
BUN: 48 mg/dL — AB (ref 6–20)
CO2: 31 mmol/L (ref 22–32)
Calcium: 8.5 mg/dL — ABNORMAL LOW (ref 8.9–10.3)
Chloride: 103 mmol/L (ref 101–111)
Creatinine, Ser: 1.38 mg/dL — ABNORMAL HIGH (ref 0.61–1.24)
GFR calc Af Amer: >60 mL/min (ref >60)
GFR, EST NON AFRICAN AMERICAN: 54 mL/min — AB (ref >60)
Glucose, Bld: 120 mg/dL — ABNORMAL HIGH (ref 65–99)
POTASSIUM: 3.8 mmol/L (ref 3.5–5.1)
Sodium: 146 mmol/L — ABNORMAL HIGH (ref 135–145)
Total Protein: 5.9 g/dL — ABNORMAL LOW (ref 6.5–8.1)

## 2017-11-27 LAB — GLUCOSE, CAPILLARY
GLUCOSE-CAPILLARY: 113 mg/dL — AB (ref 65–99)
GLUCOSE-CAPILLARY: 120 mg/dL — AB (ref 65–99)
GLUCOSE-CAPILLARY: 107 mg/dL — AB (ref 65–99)
Glucose-Capillary: 110 mg/dL — ABNORMAL HIGH (ref 65–99)
Glucose-Capillary: 119 mg/dL — ABNORMAL HIGH (ref 65–99)
Glucose-Capillary: 110 mg/dL — ABNORMAL HIGH (ref 65–99)

## 2017-11-27 LAB — HEPARIN LEVEL (UNFRACTIONATED): HEPARIN UNFRACTIONATED: 0.53 [IU]/mL (ref 0.30–0.70)

## 2017-11-27 LAB — CBC
HCT: 35.7 % — ABNORMAL LOW (ref 39.0–52.0)
Hemoglobin: 11.6 g/dL — ABNORMAL LOW (ref 13.0–17.0)
MCH: 25.8 pg — ABNORMAL LOW (ref 26.0–34.0)
MCHC: 32.5 g/dL (ref 30.0–36.0)
MCV: 79.5 fL (ref 78.0–100.0)
PLATELETS: 91 10*3/uL — AB (ref 150–400)
RBC: 4.49 MIL/uL (ref 4.22–5.81)
RDW: 21.4 % — AB (ref 11.5–15.5)
WBC: 11.1 10*3/uL — AB (ref 4.0–10.5)

## 2017-11-27 LAB — COOXEMETRY PANEL
CARBOXYHEMOGLOBIN: 1.9 % — AB (ref 0.5–1.5)
Methemoglobin: 1.3 % (ref 0.0–1.5)
O2 SAT: 93.2 %
TOTAL HEMOGLOBIN: 11.2 g/dL — AB (ref 12.0–16.0)

## 2017-11-27 LAB — TRIGLYCERIDES: Triglycerides: 80 mg/dL (ref <150)

## 2017-11-27 MED ORDER — CLOPIDOGREL BISULFATE 75 MG PO TABS
75.0000 mg | ORAL_TABLET | Freq: Every day | ORAL | Status: DC
  Administered 2017-11-27 – 2017-12-04 (×8): 75 mg
  Filled 2017-11-27 (×8): qty 1

## 2017-11-27 MED ORDER — ISOSORB DINITRATE-HYDRALAZINE 20-37.5 MG PO TABS
0.5000 | ORAL_TABLET | Freq: Three times a day (TID) | ORAL | Status: DC
  Administered 2017-11-27 – 2017-11-30 (×9): 0.5
  Filled 2017-11-27 (×13): qty 0.5

## 2017-11-27 MED ORDER — PANTOPRAZOLE SODIUM 40 MG PO PACK
40.0000 mg | PACK | Freq: Every day | ORAL | Status: DC
  Administered 2017-11-27 – 2017-12-03 (×7): 40 mg
  Filled 2017-11-27 (×8): qty 20

## 2017-11-27 MED ORDER — SENNOSIDES 8.8 MG/5ML PO SYRP
5.0000 mL | ORAL_SOLUTION | Freq: Every day | ORAL | Status: DC
  Administered 2017-11-27 – 2017-12-04 (×8): 5 mL
  Filled 2017-11-27 (×8): qty 5

## 2017-11-27 MED ORDER — DOCUSATE SODIUM 50 MG/5ML PO LIQD
100.0000 mg | Freq: Every day | ORAL | Status: DC
  Administered 2017-11-27 – 2017-12-04 (×8): 100 mg
  Filled 2017-11-27 (×8): qty 10

## 2017-11-27 MED ORDER — ISOSORB DINITRATE-HYDRALAZINE 20-37.5 MG PO TABS
0.5000 | ORAL_TABLET | Freq: Two times a day (BID) | ORAL | Status: DC
  Filled 2017-11-27: qty 0.5

## 2017-11-27 NOTE — Progress Notes (Addendum)
Subjective:  Remains intubated and sedated  3.1 L urine output/24 hrs.  Cr continues to improve. 1.38 today Blood pressure stable. Remains in sinus tachycardia with frequent PVC's  Objective:  Vital Signs in the last 24 hours: Temp:  [99.2 F (37.3 C)-100.3 F (37.9 C)] 100 F (37.8 C) (02/18 0810) Pulse Rate:  [112-182] 124 (02/18 0836) Resp:  [13-35] 21 (02/18 0836) BP: (109-139)/(72-99) 130/91 (02/18 0836) SpO2:  [96 %-99 %] 97 % (02/18 0836) FiO2 (%):  [30 %] 30 % (02/18 0836) Weight:  [138.6 kg (305 lb 8.9 oz)] 138.6 kg (305 lb 8.9 oz) (02/18 0336)  Intake/Output from previous day: 02/17 0701 - 02/18 0700 In: 3187.1 [I.V.:1507.1; NG/GT:1580; IV Piggyback:100] Out: 4250 [Urine:4250] Intake/Output from this shift: No intake/output data recorded.  Net -1.1 L  Physical Exam: Nursing note and vitals reviewed. Constitutional: He appears well-developed.  Morbidly obese. Intubated, sedated   HENT:  Head: Normocephalic.  Eyes: Scleral icterus, conjunctival congestion is present.  Neck: Normal range of motion. Neck supple. JVD present.  Cardiovascular: Normal rate and regular rhythm.  Murmur (II/VI holosystolic murmur RLSB) heard. Distant heart sounds.   Respiratory: Effort normal.  B/l diminished breast sounds at bases   GI: Soft. Bowel sounds are normal. He exhibits distension.  Musculoskeletal: Normal range of motion. He exhibits edema (1+ b/l).  Lymphadenopathy:    He has no cervical adenopathy.  Neurological:  Sedated. Detailed examination deferred at this time. Skin: Skin is warm and dry.  Psychiatric:  Not assessed     Lab Results: Recent Labs    11/26/17 0431 11/27/17 0330  WBC 10.0 11.1*  HGB 11.0* 11.6*  PLT 87* 91*   Recent Labs    11/26/17 0431 11/27/17 0330  NA 144 146*  K 3.4* 3.8  CL 102 103  CO2 30 31  GLUCOSE 126* 120*  BUN 51* 48*  CREATININE 1.48* 1.38*   No results for input(s): TROPONINI in the last 72 hours.  Invalid  input(s): CK, MB Hepatic Function Panel Recent Labs    11/27/17 0330  PROT 5.9*  ALBUMIN 2.4*  AST 129*  ALT 261*  ALKPHOS 124  BILITOT 3.7*   Echocardiogram 11/23/2017 Study Conclusions   - Left ventricle: The cavity size was moderately dilated. Wall   thickness was increased in a pattern of mild LVH. Systolic   function was severely reduced. The estimated ejection fraction   was in the range of 10% to 15%. Diffuse hypokinesis. Akinesis of   the apical myocardium. The study is not technically sufficient to   allow evaluation of LV diastolic function. Acoustic contrast   opacification revealed a large, apicalthrombusassociated with an   akinetic segment. - Ventricular septum: The contour showed diastolic flattening and   systolic flattening. These changes are consistent with RV volume   and pressure overload. - Aortic valve: There was trivial regurgitation. - Mitral valve: There was mild to moderate regurgitation. - Left atrium: The atrium was severely dilated. - Right ventricle: The cavity size was severely dilated. Systolic   function was severely reduced. - Right atrium: The atrium was severely dilated. - Atrial septum: No defect or patent foramen ovale was identified. - Tricuspid valve: There was moderate regurgitation.  Assessment: Acute systolic heart failure, resolving cardiogenic shock Good urine output on low dose diuretics.  LV apical thrombus Acute Respiratory failure Sepsis, likely due to pneumonia AKI/CKD: Resolving. Cr 1.38 today Lactic acid elevation, resolved Elevated liver enzymes: Likely shock liver. Now improving Mild troponin elevation:  Type 2 MI CAD status post  primary PCI LCx PDA Synergy DES 2.5 X 16 mm 07/09/2017 H/o Hypertension Type 2 DM Hyperlipidemia Morbid obesity Tobacco abuse Polysubstance abuse, including cocaine positive this admission H/o CVA   Recommendations:  Continue renally dosed milrinone. Continue lasix 40 mg IV bid.   Increase bidil to 1/2 tab of 20-37.5 bid for afterload reduction. Will have to watch for hypotension/worsening tachycardia Continue heparin, bridge to warfarin when possible. Continue plavix. Remains tachycardic, which is compensatory for his acute systolic heart failure. Would not start beta blockers at this time.  With his morbid obesity, history of medication noncompliance, h/o CVA, and most importantly substance abuse with urine positive for cocaine, he is not a candidate for any advanced heart failure therapy. I would not recommend escalation for his cardiac care beyond management with temporary inotropic support.   Prognosis remains guarded. I have discussed his current cardiac condition and prognosis with his partner Jeannine throughout this admission.  She is not his next of kin and not married to him.  His next of kin includes a son who lives in town, a son lives in [place], and a sister.     LOS: 6 days    Todd Sullivan J Todd Sullivan 11/27/2017, 8:39 AM  Elder Negus, MD [facility] [place] Pager: [phone] Office: [phone] If no answer Cell [phone]

## 2017-11-27 NOTE — Consult Note (Signed)
WOC Nurse wound consult note Reason for Consult: Consult requested for buttocks. Pt fell at home and was found down after an unknown period of time. Wound type: Patchy areas of partial thickness skin loss to left buttock; pt has dark-colored skin and it is difficult to assess if he had a deep tissue injury upon admission.  Skin is loose and peeling in patchy areas; approx .2X.2X.1cm, revealing pink dry skin.  Appearance is consistent with moisture associated skin damage and shear; NOT a pressure injury.   Dressing procedure/placement/frequency: Pt is critically ill with multiple systemic factors which can impair healing.  Foam dressing is in place to protect skin and promote healing.  He is on a Sport low airloss bed to reduce pressure. No family members present to discuss plan of care. Please re-consult if further assistance is needed.  Thank-you,  Cammie Mcgee MSN, RN, CWOCN, [place], CNS [phone]

## 2017-11-27 NOTE — Progress Notes (Signed)
ANTICOAGULATION CONSULT NOTE   Pharmacy Consult for Heparin Indication: new apical thrombus  Allergies  Allergen Reactions  . Banana     Sick   . Egg Yolk Nausea And Vomiting  . Kiwi Extract Nausea And Vomiting    Patient Measurements: Height: 6\' 3"  (190.5 cm) Weight: (!) 305 lb 8.9 oz (138.6 kg) IBW/kg (Calculated) : 84.5 Heparin Dosing Weight: 116 kg  Vital Signs: Temp: 99.7 F (37.6 C) (02/18 0407) Temp Source: Oral (02/18 0407) BP: 121/75 (02/18 0600) Pulse Rate: 120 (02/18 0600)  Labs: Recent Labs    11/25/17 0500 11/26/17 0431 11/26/17 1306 11/27/17 0330  HGB 11.1* 11.0*  --  11.6*  HCT 34.1* 35.3*  --  35.7*  PLT 80* 87*  --  91*  HEPARINUNFRC 0.68 0.85* 0.62 0.53  CREATININE 1.56* 1.48*  --  1.38*    Estimated Creatinine Clearance: 85.4 mL/min (A) (by C-G formula based on SCr of 1.38 mg/dL (H)).  Assessment: 62 y.o. Sullivan with CHF and apical thrombus. Pharmacy is dosing heparin.   Heparin level is therapeutic at 0.53 on 1750 units/hr. CBC is stable and no signs of bleeding have been noted.    Goal of Therapy:  Heparin level 0.3-0.7 units/ml Monitor platelets by anticoagulation protocol: Yes   Plan:  Continue heparin 1750 units/hr Daily heparin level and CBC Monitor for signs/symptoms of bleeding F/U conversion to oral anticoagulation  Sharin Mons, PharmD, BCPS PGY2 Infectious Diseases Pharmacy Resident Pager: [phone]  11/27/2017 7:51 AM

## 2017-11-27 NOTE — Progress Notes (Signed)
PULMONARY / CRITICAL CARE MEDICINE   Name: Todd Sullivan MRN: [id number] DOB: 10-May-1956    ADMISSION DATE:  12/02/2017 CONSULTATION DATE:  Dec 02, 2017  REFERRING MD: Dr. Wilkie Aye  CHIEF COMPLAINT:  SOB  HISTORY OF PRESENT ILLNESS:   HPI obtained from medical chart review as patient is currently intubated and sedated on mechanical ventilation.  62 year old male with past medical history significant for COPD, asthma, diabetes, CAD status post STEMI/ stent 06/2017, hypertension, and CVA who presented via EMS with complaints of shortness of breath and altered mental status. His significant other reports she has not done well since he MI 3 months ago.  Also reports he has not taken his medications for the last 5 days.  He has not been out of bed for the last 5 days.  Found him after he fell out of bed called 911 and was transported to [facility].  On arrival to the emergency room he was unable to protect his airway and was intubated.  He is to have a CT of his head to rule out CVA and/or trauma since he has had a history of CVA in the past.  On my arrival in the emergency room to trauma a he was sedated with propofol and going to CT scan for evaluation.  Neuro exam was difficult due to high level of sedation.  Difficult due to high level sedation  Note he had profound hypoglycemia otherwise not on insulin but he is on metformin.  He had to be started on dextrose 10% drip to maintain his glucose levels greater than 90.  Labs noted for BUN 53, sCr 3.18 (baseline 1.5-1.6), lactic acid 11.99 -> 9.52, BNP 1886, AST 1073, ALT 804, t. Bili 5.4, WBC 14.4, Plts 107, TSH 0.888, initial CXR showing pulmonary edema   We will admit to the intensive care unit continue to explore laboratory data evaluate CT scan of the head.  Treat underlying lactic acidosis, evaluate for infectious process and cardiology is to evaluate.  SUBJECTIVE / Interval Events:  No acute events overnight. Patient continue to be  sedated and more tachycardic. No sign of hypotension.  VITAL SIGNS: BP 121/75   Pulse (!) 120   Temp 99.7 F (37.6 C) (Oral)   Resp (!) 35   Ht 6\' 3"  (1.905 m)   Wt (!) 305 lb 8.9 oz (138.6 kg)   SpO2 98%   BMI 38.19 kg/m   HEMODYNAMICS:    VENTILATOR SETTINGS: Vent Mode: PRVC FiO2 (%):  [30 %] 30 % Set Rate:  [16 bmp] 16 bmp Vt Set:  [680 mL] 680 mL PEEP:  [5 cmH20] 5 cmH20 Pressure Support:  [14 cmH20] 14 cmH20 Plateau Pressure:  [20 cmH20-21 cmH20] 21 cmH20  INTAKE / OUTPUT: I/O last 3 completed shifts: In: 3945.2 [I.V.:1915.2; NG/GT:1930; IV Piggyback:100] Out: 5925 [Urine:5925]  PHYSICAL EXAMINATION: General: Obese man, ill-appearing, sedated, intubated Neuro:.  Less responsive than over the last few days, tries to open eyes and turns head to voice HEENT: Right IJ CVC in place ET tube in place Cardiovascular: Distant, regular, holosystolic murmur 2/6 Lungs: coarse bilaterally, decreased at both bases musculoskeletal: No deformity Skin: Trace lower extremity edema, improving  LABS:  BMET Recent Labs  Lab 11/25/17 0500 11/26/17 0431 11/27/17 0330  NA 142 144 146*  K 3.5 3.4* 3.8  CL 100* 102 103  CO2 27 30 31   BUN 55* 51* 48*  CREATININE 1.56* 1.48* 1.38*  GLUCOSE 115* 126* 120*    Electrolytes Recent Labs  Lab 12/03/2017 0823 11/22/17 0430  11/24/17 0446 11/25/17 0500 11/26/17 0431 11/27/17 0330  CALCIUM 8.9 8.8*   < > 8.4* 8.3* 8.3* 8.5*  MG 1.8 2.0  --  1.7  --   --   --   PHOS 5.4* 3.7  --   --   --   --   --    < > = values in this interval not displayed.    CBC Recent Labs  Lab 11/25/17 0500 11/26/17 0431 11/27/17 0330  WBC 10.4 10.0 11.1*  HGB 11.1* 11.0* 11.6*  HCT 34.1* 35.3* 35.7*  PLT 80* 87* 91*    Coag's Recent Labs  Lab 11/27/2017 0823 11/24/17 0446  APTT 37*  --   INR 2.51 1.64    Sepsis Markers Recent Labs  Lab 12/07/2017 0823 11/24/2017 1053 11/22/17 1430 11/22/17 1715  LATICACIDVEN 5.8* 3.9* 1.9 1.9   PROCALCITON 1.85  --   --   --     ABG Recent Labs  Lab 11/30/2017 0520 11/18/2017 0849 11/22/17 0344  PHART 7.273* 7.332* 7.454*  PCO2ART 46.3 42.6 40.3  PO2ART 216.0* 70.0* 172.0*    Liver Enzymes Recent Labs  Lab 11/23/17 0524 11/24/17 0446 11/27/17 0330  AST 993* 525* 129*  ALT 947* 646* 261*  ALKPHOS 166* 148* 124  BILITOT 4.7* 4.5* 3.7*  ALBUMIN 2.5* 2.4* 2.4*    Cardiac Enzymes Recent Labs  Lab 11/22/2017 0823 11/20/2017 1445 11/22/17 0451  TROPONINI 0.05* 0.06* 0.07*    Glucose Recent Labs  Lab 11/26/17 0753 11/26/17 1138 11/26/17 1612 11/26/17 1959 11/26/17 2339 11/27/17 0404  GLUCAP 107* 121* 112* 104* 124* 113*    Imaging No results found.   STUDIES:  12/07/2017 CT of the head>>No acute intracranial abnormalities CXR 2/15 >>Progression of bibasilar airspace disease and small pleural effusion.   CULTURES: 11/30/2017 blood cultures x2>>NGTD 12/07/2017 sputum culture>>NGTD 11/10/2017 urine culture>>NGTD 12/03/2017 respiratory>> Candida albicans 11/24/2017 pro-calcitonin>>1.85 12/06/2017 respiratory virus panel>>Negative   ANTIBIOTICS: 2/12 Vancomycin>> 2/13 2/12 Zosyn>>2/13 2/13 Levaquin>>2/14 2/14 Ceftriaxone >>   SIGNIFICANT EVENTS: 11/14/2017 intubated for respiratory failure  LINES/TUBES: 11/15/2017 endotracheal tube>> 11/10/2017 right internal jugular CVL>>  DISCUSSION: 62 year old morbidly obese male who modality has been smoking recently states he is on NicoDerm patches.  He is noted to be and have been declining over the past 3 months following cardiac cath MI and stent placement.  He has not taken his medication for the last 5 days nor has he been out of bed for the last 5 days.  Significant other noted that he got up and fell during the night EMS was activated his transport to the emergency room he was unable to protect his airway and was intubated.  ASSESSMENT / PLAN:  PULMONARY A: Vent dependent respiratory failure secondary to  inability to protect airway, L>R infiltrates Cardiogenic pulmonary edema Probable CAP left lower lobe P:   Continue current ventilator support Work on pressure support ventilation, will need to lighten sedation also need further volume removal Follow chest x-ray Continue diuresis, volume optimization   CARDIOVASCULAR A:  Coronary artery disease history with MI 3 (9/18) primary PCI to dominant left circumflex and PDA with synergy drug-eluting stent.   Ischemic CM, possible contribution viral or septic CM LV thrombus  P:  Appreciate cardiology assistance Hold off on beta-blocker at this time Continue Bidil tachycardia w/o hypotension  Tolerating diuretics, continue same dose of furosemide 2/17 Continue milrinone for inotropic support, helping with volume mobilization Continue Plavix, heparin drip Off pressors currently,  re-add if this will help facilitate diuresis Transition to Coumadin once acute issues stabilize  RENAL A:   Acute renal failure with last known creatinine 1.48>>1.38. Improving. Presume cardiorenal syndrome. Continue to have good UOP. Net -1L\  P:   Follow urine output, BMP on current milrinone and diuretics Adjusting diuretics daily depending on volume status and renal function  GASTROINTESTINAL A:   Transaminitis.  Hepatitis panel negative, right upper quadrant ultrasound without any evidence of acute cholecystitis.  Suspect shock liver.  Improving Morbid obesity GI protection P:   Continue PPI Continue tube feeding as tolerated, note he had emesis 2/16 Follow LFT, coags  HEMATOLOGIC A:  Large apical thrombus seen on Echo 2/14 with akinetic apex.   P:  Continue heparin as ordered Continue plavix Discontinue ASA and brilinta Convert to Coumadin when stable from the acute episode Follow CBC  INFECTIOUS A:   Suspected left lower lobe CAP -Influenza and RVP negative P:   Complete ceftriaxone course, day 7 of 7 Follow cultures to  completion   ENDOCRINE CBG (last 3)  Recent Labs    11/26/17 1959 11/26/17 2339 11/27/17 0404  GLUCAP 104* 124* 113*    A:   T2DM, A1c 6.5.  Initial hypoglycemia, resolved  P:   Enteral diabetic medications on hold SSI per protocol  NEUROLOGIC A:   Acute encephalopathy, toxic metabolic +possibly substances Cocaine abuse Sedation for MV P:   RASS goal: -1 Lighten sedation as able   FAMILY  - Updates: Girlfriend at bedside 2/17  Independent CC time 34 minutes   Lovena Neighbours, MD [facility], PGY-2 11/27/2017, 7:08 AM

## 2017-11-28 ENCOUNTER — Inpatient Hospital Stay (HOSPITAL_COMMUNITY): Payer: Medicare Other

## 2017-11-28 ENCOUNTER — Other Ambulatory Visit: Payer: Self-pay

## 2017-11-28 DIAGNOSIS — J9600 Acute respiratory failure, unspecified whether with hypoxia or hypercapnia: Secondary | ICD-10-CM

## 2017-11-28 DIAGNOSIS — L8990 Pressure ulcer of unspecified site, unspecified stage: Secondary | ICD-10-CM

## 2017-11-28 LAB — COOXEMETRY PANEL
Carboxyhemoglobin: 1.8 % — ABNORMAL HIGH (ref 0.5–1.5)
Carboxyhemoglobin: 2 % — ABNORMAL HIGH (ref 0.5–1.5)
Methemoglobin: 1.2 % (ref 0.0–1.5)
Methemoglobin: 0.8 % (ref 0.0–1.5)
O2 SAT: 81.4 %
O2 Saturation: 70.6 %
Total hemoglobin: 10 g/dL — ABNORMAL LOW (ref 12.0–16.0)
Total hemoglobin: 12 g/dL (ref 12.0–16.0)

## 2017-11-28 LAB — CBC
HEMATOCRIT: 35.8 % — AB (ref 39.0–52.0)
Hemoglobin: 11.2 g/dL — ABNORMAL LOW (ref 13.0–17.0)
MCH: 25.1 pg — ABNORMAL LOW (ref 26.0–34.0)
MCHC: 31.3 g/dL (ref 30.0–36.0)
MCV: 80.3 fL (ref 78.0–100.0)
PLATELETS: 105 10*3/uL — AB (ref 150–400)
RBC: 4.46 MIL/uL (ref 4.22–5.81)
RDW: 22 % — ABNORMAL HIGH (ref 11.5–15.5)
WBC: 11.8 10*3/uL — ABNORMAL HIGH (ref 4.0–10.5)

## 2017-11-28 LAB — BASIC METABOLIC PANEL
ANION GAP: 12 (ref 5–15)
BUN: 45 mg/dL — ABNORMAL HIGH (ref 6–20)
CHLORIDE: 103 mmol/L (ref 101–111)
CO2: 31 mmol/L (ref 22–32)
Calcium: 8.5 mg/dL — ABNORMAL LOW (ref 8.9–10.3)
Creatinine, Ser: 1.24 mg/dL (ref 0.61–1.24)
GFR calc non Af Amer: >60 mL/min (ref >60)
GLUCOSE: 108 mg/dL — AB (ref 65–99)
Potassium: 3.3 mmol/L — ABNORMAL LOW (ref 3.5–5.1)
Sodium: 146 mmol/L — ABNORMAL HIGH (ref 135–145)

## 2017-11-28 LAB — GLUCOSE, CAPILLARY
GLUCOSE-CAPILLARY: 102 mg/dL — AB (ref 65–99)
GLUCOSE-CAPILLARY: 122 mg/dL — AB (ref 65–99)
GLUCOSE-CAPILLARY: 125 mg/dL — AB (ref 65–99)
Glucose-Capillary: 119 mg/dL — ABNORMAL HIGH (ref 65–99)
Glucose-Capillary: 129 mg/dL — ABNORMAL HIGH (ref 65–99)
Glucose-Capillary: 110 mg/dL — ABNORMAL HIGH (ref 65–99)

## 2017-11-28 LAB — HEPARIN LEVEL (UNFRACTIONATED): HEPARIN UNFRACTIONATED: 0.47 [IU]/mL (ref 0.30–0.70)

## 2017-11-28 MED ORDER — PNEUMOCOCCAL VAC POLYVALENT 25 MCG/0.5ML IJ INJ
0.5000 mL | INJECTION | INTRAMUSCULAR | Status: AC
  Administered 2017-11-29: 0.5 mL via INTRAMUSCULAR
  Filled 2017-11-28: qty 0.5

## 2017-11-28 MED ORDER — BISACODYL 10 MG RE SUPP
10.0000 mg | Freq: Once | RECTAL | Status: AC
Start: 2017-11-28 — End: 2017-11-28
  Administered 2017-11-28: 10 mg via RECTAL
  Filled 2017-11-28: qty 1

## 2017-11-28 MED ORDER — CAPTOPRIL 6.25 MG HALF TABLET
6.2500 mg | ORAL_TABLET | Freq: Three times a day (TID) | ORAL | Status: DC
  Administered 2017-11-28 – 2017-12-11 (×35): 6.25 mg via ORAL
  Filled 2017-11-28 (×43): qty 1

## 2017-11-28 MED ORDER — CIPROFLOXACIN HCL 0.3 % OP SOLN
2.0000 [drp] | Freq: Four times a day (QID) | OPHTHALMIC | Status: AC
  Administered 2017-11-28 – 2017-12-04 (×28): 2 [drp] via OPHTHALMIC
  Filled 2017-11-28 (×2): qty 2.5

## 2017-11-28 MED ORDER — POTASSIUM CHLORIDE 20 MEQ/15ML (10%) PO SOLN
40.0000 meq | ORAL | Status: AC
  Administered 2017-11-28 (×2): 40 meq via ORAL
  Filled 2017-11-28 (×2): qty 30

## 2017-11-28 NOTE — Progress Notes (Signed)
PULMONARY / CRITICAL CARE MEDICINE   Name: Todd Sullivan MRN: [id number] DOB: [birth year]    ADMISSION DATE:  12-04-2017 CONSULTATION DATE:  2017-12-04  REFERRING MD: Dr. Wilkie Aye  CHIEF COMPLAINT:  SOB  HISTORY OF PRESENT ILLNESS:   HPI obtained from medical chart review as patient is currently intubated and sedated on mechanical ventilation.  62 year old male with past medical history significant for COPD, asthma, diabetes, CAD status post STEMI/ stent 06/2017, hypertension, and CVA who presented via EMS with complaints of shortness of breath and altered mental status. His significant other reports she has not done well since he MI 3 months ago.  Also reports he has not taken his medications for the last 5 days.  He has not been out of bed for the last 5 days.  Found him after he fell out of bed called 911 and was transported to [facility].  On arrival to the emergency room he was unable to protect his airway and was intubated.  He is to have a CT of his head to rule out CVA and/or trauma since he has had a history of CVA in the past.  On my arrival in the emergency room to trauma a he was sedated with propofol and going to CT scan for evaluation.  Neuro exam was difficult due to high level of sedation.  Difficult due to high level sedation  Note he had profound hypoglycemia otherwise not on insulin but he is on metformin.  He had to be started on dextrose 10% drip to maintain his glucose levels greater than 90.  Labs noted for BUN 53, sCr 3.18 (baseline 1.5-1.6), lactic acid 11.99 -> 9.52, BNP 1886, AST 1073, ALT 804, t. Bili 5.4, WBC 14.4, Plts 107, TSH 0.888, initial CXR showing pulmonary edema   We will admit to the intensive care unit continue to explore laboratory data evaluate CT scan of the head.  Treat underlying lactic acidosis, evaluate for infectious process and cardiology is to evaluate.  SUBJECTIVE / Interval Events:  No acute events overnight. Patient continue to be  sedated and more tachycardic. No sign of hypotension.  VITAL SIGNS: BP 128/80   Pulse (!) 120   Temp 99.1 F (37.3 C) (Oral)   Resp 16   Ht 6\' 3"  (1.905 m)   Wt 294 lb 12.1 oz (133.7 kg)   SpO2 97%   BMI 36.84 kg/m   HEMODYNAMICS:    VENTILATOR SETTINGS: Vent Mode: PRVC FiO2 (%):  [30 %] 30 % Set Rate:  [16 bmp] 16 bmp Vt Set:  [680 mL] 680 mL PEEP:  [5 cmH20] 5 cmH20 Pressure Support:  [14 cmH20] 14 cmH20 Plateau Pressure:  [18 cmH20-26 cmH20] 26 cmH20  INTAKE / OUTPUT: I/O last 3 completed shifts: In: 5003.6 [I.V.:2193.6; NG/GT:2610; IV Piggyback:200] Out: 6805 [Urine:6805]  PHYSICAL EXAMINATION: General: Obese man, ill-appearing, sedated, intubated Neuro:.  Less responsive than over the last few days, tries to open eyes and turns head to voice HEENT: Right IJ CVC in place ET tube in place Cardiovascular: Distant, regular, holosystolic murmur 2/6 Lungs: coarse bilaterally, decreased at both bases musculoskeletal: No deformity Skin: Trace lower extremity edema, improving  LABS:  BMET Recent Labs  Lab 11/26/17 0431 11/27/17 0330 11/28/17 0415  NA 144 146* 146*  K 3.4* 3.8 3.3*  CL 102 103 103  CO2 30 31 31   BUN 51* 48* 45*  CREATININE 1.48* 1.38* 1.24  GLUCOSE 126* 120* 108*    Electrolytes Recent Labs  Lab 04-Dec-2017  1610 11/22/17 0430  11/24/17 0446  11/26/17 0431 11/27/17 0330 11/28/17 0415  CALCIUM 8.9 8.8*   < > 8.4*   < > 8.3* 8.5* 8.5*  MG 1.8 2.0  --  1.7  --   --   --   --   PHOS 5.4* 3.7  --   --   --   --   --   --    < > = values in this interval not displayed.    CBC Recent Labs  Lab 11/26/17 0431 11/27/17 0330 11/28/17 0415  WBC 10.0 11.1* 11.8*  HGB 11.0* 11.6* 11.2*  HCT 35.3* 35.7* 35.8*  PLT 87* 91* 105*    Coag's Recent Labs  Lab Nov 29, 2017 0823 11/24/17 0446  APTT 37*  --   INR 2.51 1.64    Sepsis Markers Recent Labs  Lab 29-Nov-2017 0823 2017-11-29 1053 11/22/17 1430 11/22/17 1715  LATICACIDVEN 5.8* 3.9*  1.9 1.9  PROCALCITON 1.85  --   --   --     ABG Recent Labs  Lab 11/29/2017 0849 11/22/17 0344  PHART 7.332* 7.454*  PCO2ART 42.6 40.3  PO2ART 70.0* 172.0*    Liver Enzymes Recent Labs  Lab 11/23/17 0524 11/24/17 0446 11/27/17 0330  AST 993* 525* 129*  ALT 947* 646* 261*  ALKPHOS 166* 148* 124  BILITOT 4.7* 4.5* 3.7*  ALBUMIN 2.5* 2.4* 2.4*    Cardiac Enzymes Recent Labs  Lab 11-29-2017 0823 2017/11/29 1445 11/22/17 0451  TROPONINI 0.05* 0.06* 0.07*    Glucose Recent Labs  Lab 11/27/17 0808 11/27/17 1133 11/27/17 1517 11/27/17 1945 11/27/17 2316 11/28/17 0411  GLUCAP 110* 119* 120* 107* 110* 119*    Imaging Dg Chest Port 1 View  Result Date: 11/27/2017 CLINICAL DATA:  COPD EXAM: PORTABLE CHEST 1 VIEW COMPARISON:  11/25/2017 FINDINGS: Endotracheal tube is 5 cm above the carina. Right central line and NG tube are unchanged. Cardiomegaly. Mild vascular congestion. Improving right lower lobe airspace opacity. Continued left perihilar and lower lobe density and minimal right basilar opacity. Small left effusion. IMPRESSION: Improving aeration in the lungs, particularly in the right lower lobe. Continued left perihilar and bilateral lower lobe opacities which could reflect edema or infection. Small left effusion. Cardiomegaly, vascular congestion. Electronically Signed   By: Charlett Nose M.D.   On: 11/27/2017 08:29     STUDIES:  2017/11/29 CT of the head>>No acute intracranial abnormalities CXR 2/15 >>Progression of bibasilar airspace disease and small pleural effusion.   CULTURES: 11-29-2017 blood cultures x2>>NGTD 2017-11-29 sputum culture>>NGTD 11-29-2017 urine culture>>NGTD Nov 29, 2017 respiratory>> Candida albicans November 29, 2017 pro-calcitonin>>1.85 11-29-17 respiratory virus panel>>Negative   ANTIBIOTICS: 2/12 Vancomycin>> 2/13 2/12 Zosyn>>2/13 2/13 Levaquin>>2/14 2/14 Ceftriaxone >>2/18 2/19 Ciprofloxacin gtt >>  SIGNIFICANT EVENTS: November 29, 2017 intubated for  respiratory failure  LINES/TUBES: 2017-11-29 endotracheal tube>> November 29, 2017 right internal jugular CVL>>  DISCUSSION: 62 year old morbidly obese male who modality has been smoking recently states he is on NicoDerm patches.  He is noted to be and have been declining over the past 3 months following cardiac cath MI and stent placement.  He has not taken his medication for the last 5 days nor has he been out of bed for the last 5 days.  Significant other noted that he got up and fell during the night EMS was activated his transport to the emergency room he was unable to protect his airway and was intubated.  ASSESSMENT / PLAN:  PULMONARY A: Vent dependent respiratory failure secondary to inability to protect airway Cardiogenic pulmonary edema Probable  CAP left lower lobe  P:   Continue current ventilator support Work on pressure support ventilation, will need to lighten sedation also need further volume removal Follow chest x-ray Continue diuresis, volume optimization   CARDIOVASCULAR A:  Coronary artery disease history with MI 3 (9/18) primary PCI to dominant left circumflex and PDA with synergy drug-eluting stent.   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 Cooxemetry 81.4.  P:  Appreciate cardiology assistance Continue Bidil tachycardia w/o hypotension Captopril added  Tolerating diuretics, continue same dose of furosemide 2/17 Decrease milrinone dose for inotropic support given Cooxemetry readings, helping with volume mobilization Continue Plavix, heparin drip Off pressors currently, re-add if this will help facilitate diuresis Transition to Coumadin once acute issues stabilize  RENAL A:   Acute renal failure with last known creatinine 1.38>>1.24. Improving. Presume cardiorenal syndrome. Continue to have good UOP. Net ~ -1L  P:   Follow urine output, BMP on current milrinone and diuretics Adjusting diuretics daily depending on volume status and  renal function  GASTROINTESTINAL A:   Transaminitis.  Hepatitis panel negative, right upper quadrant ultrasound without any evidence of acute cholecystitis.  Suspect shock liver.  Improving Morbid obesity GI protection P:   Continue PPI Continue tube feeding as tolerated, note he had emesis 2/16 Follow LFT, coags  HEMATOLOGIC A:  Large apical thrombus seen on Echo 2/14 with akinetic apex.   P:  Continue heparin as ordered Continue plavix Convert to Coumadin when stable from the acute episode   INFECTIOUS A:   Suspected left lower lobe CAP. Cultures and tracheal aspriates have been negative. Patient completed 7 days antibiotic course. Influenza and RVP negative. New rash noted on torso, abdomen, upper and lower extremities. Unclear etiology. Bilateral discharge from eyes concerning bacterial conjunctivitis  P:   Fever curve Started on cipro ophthalmic solution  ENDOCRINE CBG (last 3)  Recent Labs    11/27/17 1945 11/27/17 2316 11/28/17 0411  GLUCAP 107* 110* 119*    A:   T2DM, A1c 6.5.  Initial hypoglycemia, resolved  P:   Enteral diabetic medications on hold SSI per protocol  NEUROLOGIC A:   Acute encephalopathy, toxic metabolic +possibly substances Cocaine abuse. Minimally responsive.   P:   RASS goal: -1 Lighten sedation as able.   FAMILY  - Updates: Girlfriend at bedside    Lovena Neighbours, MD [facility], PGY-2 11/28/2017, 6:55 AM

## 2017-11-28 NOTE — Progress Notes (Signed)
Independently examined pt, evaluated data & formulated above care plan with NP/resident   62 year old obese man admitted with 5 days of weakness and encephalopathy, hypoglycemia and transaminitis. with diabetes, COPD, coronary artery disease, history of CVA and hypertension, cocaine use.   He had a left lower lobe consolidation that is treated as a  community acquired pneumonia.  He was found to have an EF of 10% with an LV thrombus and was placed on IV milrinone and heparin.  On exam-mild agitation consultative drips, diuresed well with Lasix, decreased breath sounds bilateral, S1-S2 tachy  Labs show mild hypokalemia Chest x-ray replacement reviewed by me shows hardware in position and left lower lobe infiltrate.  Impression/plan Acute respiratory failure -attempt pressure support weaning although ideally would trial T-piece weaning before extubation due to such a low cardiac output.  Cardiomyopathy in the setting of cocaine use/coronary artery disease-cardiology assisting, titrating case and hydralazine/nitrates, Decrease milrinone to 0.125 based on co-ox  Can stop ceftriaxone after 7 days for left lower lobe community acquired pneumonia Continue IV heparin for LV thrombus.  Prognosis guarded, goal would be to try to liberate him from mechanical ventilation, not a candidate for advanced heart failure therapies  The patient is critically ill with multiple organ systems failure and requires high complexity decision making for assessment and support, frequent evaluation and titration of therapies, application of advanced monitoring technologies and extensive interpretation of multiple databases. Critical Care Time devoted to patient care services described in this note independent of APP/resident  time is 35 minutes.    Comer Locket Vassie Loll MD

## 2017-11-28 NOTE — Progress Notes (Signed)
ANTICOAGULATION CONSULT NOTE   Pharmacy Consult for Heparin Indication: new apical thrombus  Allergies  Allergen Reactions  . Banana     Sick   . Egg Yolk Nausea And Vomiting  . Kiwi Extract Nausea And Vomiting    Patient Measurements: Height: 6\' 3"  (190.5 cm) Weight: 294 lb 12.1 oz (133.7 kg) IBW/kg (Calculated) : 84.5 Heparin Dosing Weight: 116 kg  Vital Signs: Temp: 99.1 F (37.3 C) (02/19 0413) Temp Source: Oral (02/19 0413) BP: 128/80 (02/19 0600) Pulse Rate: 120 (02/19 0600)  Labs: Recent Labs    11/26/17 0431 11/26/17 1306 11/27/17 0330 11/28/17 0415  HGB 11.0*  --  11.6* 11.2*  HCT 35.3*  --  35.7* 35.8*  PLT 87*  --  91* 105*  HEPARINUNFRC 0.85* 0.62 0.53 0.47  CREATININE 1.48*  --  1.38* 1.24    Estimated Creatinine Clearance: 93.4 mL/min (by C-G formula based on SCr of 1.24 mg/dL).  Assessment: 62 y.o. male with CHF and apical thrombus. Pharmacy is dosing heparin.   Heparin level is therapeutic at 0.47 on 1750 units/hr. CBC is stable and no signs of bleeding have been noted.    Goal of Therapy:  Heparin level 0.3-0.7 units/ml Monitor platelets by anticoagulation protocol: Yes   Plan:  Continue heparin 1750 units/hr Daily heparin level and CBC Monitor for signs/symptoms of bleeding F/U conversion to oral anticoagulation  Sharin Mons, PharmD, BCPS PGY2 Infectious Diseases Pharmacy Resident Pager: [phone]  11/28/2017 7:03 AM

## 2017-11-28 NOTE — Progress Notes (Addendum)
Subjective:  Remains intubated and sedated No purposeful movement for me today.  3.7 L urine output/24 hrs.  Cr continues to improve. 1.24 today Blood pressure stable. Remains in sinus tachycardia with frequent PVC's  Objective:  Vital Signs in the last 24 hours: Temp:  [98.5 F (36.9 C)-99.3 F (37.4 C)] 99 F (37.2 C) (02/19 1142) Pulse Rate:  [113-138] 127 (02/19 1238) Resp:  [14-20] 20 (02/19 1238) BP: (105-143)/(71-96) 130/92 (02/19 1238) SpO2:  [94 %-100 %] 95 % (02/19 1238) FiO2 (%):  [30 %] 30 % (02/19 1238) Weight:  [133.7 kg (294 lb 12.1 oz)] 133.7 kg (294 lb 12.1 oz) (02/19 0431)  Intake/Output from previous day: 02/18 0701 - 02/19 0700 In: 2954.2 [I.V.:1274.2; NG/GT:1580; IV Piggyback:100] Out: 3710 [Urine:3710] Intake/Output from this shift: Total I/O In: 294 [I.V.:144; NG/GT:150] Out: 525 [Urine:525]  Net -2.4 L  Physical Exam: Nursing note and vitals reviewed. Constitutional: Todd Sullivan appears well-developed.  Morbidly obese. Intubated, sedated   HENT:  Head: Normocephalic.  Eyes: Bilateral eyes purulent discharge is present.  Neck: Normal range of motion. Neck supple. JVD present.  Cardiovascular: Normal rate and regular rhythm.  Murmur (II/VI holosystolic murmur RLSB) heard. Distant heart sounds.   Respiratory: Effort normal.  B/l diminished breast sounds at bases   GI: Soft. Bowel sounds are normal. Todd Sullivan exhibits distension.  Musculoskeletal: Normal range of motion. Todd Sullivan exhibits edema (1+ b/l).  Lymphadenopathy:    Todd Sullivan has no cervical adenopathy.  Neurological:  Sedated. Detailed examination deferred at this time. Skin: Skin is warm and dry.  Psychiatric:  Not assessed     Lab Results: Recent Labs    11/27/17 0330 11/28/17 0415  WBC 11.1* 11.8*  HGB 11.6* 11.2*  PLT 91* 105*   Recent Labs    11/27/17 0330 11/28/17 0415  NA 146* 146*  K 3.8 3.3*  CL 103 103  CO2 31 31  GLUCOSE 120* 108*  BUN 48* 45*  CREATININE 1.38* 1.24   No  results for input(s): TROPONINI in the last 72 hours.  Invalid input(s): CK, MB Hepatic Function Panel Recent Labs    11/27/17 0330  PROT 5.9*  ALBUMIN 2.4*  AST 129*  ALT 261*  ALKPHOS 124  BILITOT 3.7*   Echocardiogram 11/23/2017 Study Conclusions   - Left ventricle: The cavity size was moderately dilated. Wall   thickness was increased in a pattern of mild LVH. Systolic   function was severely reduced. The estimated ejection fraction   was in the range of 10% to 15%. Diffuse hypokinesis. Akinesis of   the apical myocardium. The study is not technically sufficient to   allow evaluation of LV diastolic function. Acoustic contrast   opacification revealed a large, apicalthrombusassociated with an   akinetic segment. - Ventricular septum: The contour showed diastolic flattening and   systolic flattening. These changes are consistent with RV volume   and pressure overload. - Aortic valve: There was trivial regurgitation. - Mitral valve: There was mild to moderate regurgitation. - Left atrium: The atrium was severely dilated. - Right ventricle: The cavity size was severely dilated. Systolic   function was severely reduced. - Right atrium: The atrium was severely dilated. - Atrial septum: No defect or patent foramen ovale was identified. - Tricuspid valve: There was moderate regurgitation.  Assessment: Acute systolic heart failure, resolving cardiogenic shock Good urine output on low dose diuretics.  LV apical thrombus Acute Respiratory failure. Difficulty weaning. Sepsis, likely due to pneumonia B/l conjunctivitis?: On topical antibiotics AKI/CKD: Resolving.  Cr 1.24 today Lactic acid elevation, resolved Elevated liver enzymes: Likely shock liver. Now improving Mild troponin elevation: Type 2 MI CAD status post  primary PCI LCx PDA Synergy DES 2.5 X 16 mm 07/09/2017 H/o Hypertension Type 2 DM Hyperlipidemia Morbid obesity Tobacco abuse Polysubstance abuse, including  cocaine positive this admission H/o CVA   Recommendations:  Continue renally dosed milrinone. Continue lasix 40 mg IV bid.  Agree with decreasing milrinone. Should be able to wean off in next 48 hrs.  Continue bidil 1/2 tab of 20-37.5 tid for afterload reduction. Added captopril 6.25 mg tid. Will have to watch for hypotension/worsening tachycardia. Continue heparin, bridge to warfarin when possible. Continue plavix. Remains tachycardic, which is compensatory for his acute systolic heart failure. Would not start beta blockers at this time.  With his morbid obesity, history of medication noncompliance, h/o CVA, and most importantly substance abuse with urine positive for cocaine, Todd Sullivan is not a candidate for any advanced heart failure therapy. I would not recommend escalation for his cardiac care beyond management with temporary inotropic support.   Prognosis remains guarded. I have discussed his current cardiac condition and prognosis with his partner Todd Sullivan throughout this admission.  She is not his next of kin and not married to him.  His next of kin includes a son who lives in town, a son lives in [place], and a sister.     LOS: 7 days    Todd Sullivan J Alcus Bradly 11/28/2017, 12:48 PM  Herberto Ledwell Emiliano Dyer, MD [facility] [place] Pager: [phone] Office: [phone] If no answer Cell [phone]

## 2017-11-29 ENCOUNTER — Inpatient Hospital Stay (HOSPITAL_COMMUNITY): Payer: Medicare Other

## 2017-11-29 LAB — MAGNESIUM: Magnesium: 1.6 mg/dL — ABNORMAL LOW (ref 1.7–2.4)

## 2017-11-29 LAB — GLUCOSE, CAPILLARY
GLUCOSE-CAPILLARY: 105 mg/dL — AB (ref 65–99)
GLUCOSE-CAPILLARY: 120 mg/dL — AB (ref 65–99)
GLUCOSE-CAPILLARY: 105 mg/dL — AB (ref 65–99)
Glucose-Capillary: 106 mg/dL — ABNORMAL HIGH (ref 65–99)
Glucose-Capillary: 130 mg/dL — ABNORMAL HIGH (ref 65–99)
Glucose-Capillary: 98 mg/dL (ref 65–99)

## 2017-11-29 LAB — CBC
HCT: 35.2 % — ABNORMAL LOW (ref 39.0–52.0)
Hemoglobin: 11.2 g/dL — ABNORMAL LOW (ref 13.0–17.0)
MCH: 25.5 pg — AB (ref 26.0–34.0)
MCHC: 31.8 g/dL (ref 30.0–36.0)
MCV: 80.2 fL (ref 78.0–100.0)
PLATELETS: 127 10*3/uL — AB (ref 150–400)
RBC: 4.39 MIL/uL (ref 4.22–5.81)
RDW: 22.2 % — AB (ref 11.5–15.5)
WBC: 11.9 10*3/uL — AB (ref 4.0–10.5)

## 2017-11-29 LAB — BASIC METABOLIC PANEL
ANION GAP: 13 (ref 5–15)
BUN: 47 mg/dL — ABNORMAL HIGH (ref 6–20)
CHLORIDE: 106 mmol/L (ref 101–111)
CO2: 30 mmol/L (ref 22–32)
Calcium: 8.7 mg/dL — ABNORMAL LOW (ref 8.9–10.3)
Creatinine, Ser: 1.24 mg/dL (ref 0.61–1.24)
GFR calc Af Amer: >60 mL/min (ref >60)
GFR calc non Af Amer: >60 mL/min (ref >60)
GLUCOSE: 102 mg/dL — AB (ref 65–99)
POTASSIUM: 3.5 mmol/L (ref 3.5–5.1)
Sodium: 149 mmol/L — ABNORMAL HIGH (ref 135–145)

## 2017-11-29 LAB — COOXEMETRY PANEL
CARBOXYHEMOGLOBIN: 1.8 % — AB (ref 0.5–1.5)
Methemoglobin: 1.3 % (ref 0.0–1.5)
O2 Saturation: 92.9 %
Total hemoglobin: 10.7 g/dL — ABNORMAL LOW (ref 12.0–16.0)

## 2017-11-29 LAB — HEPARIN LEVEL (UNFRACTIONATED): Heparin Unfractionated: 0.39 IU/mL (ref 0.30–0.70)

## 2017-11-29 LAB — PHOSPHORUS: PHOSPHORUS: 2.4 mg/dL — AB (ref 2.5–4.6)

## 2017-11-29 MED ORDER — FREE WATER
200.0000 mL | Freq: Four times a day (QID) | Status: DC
  Administered 2017-11-29 – 2017-12-04 (×20): 200 mL

## 2017-11-29 MED ORDER — MAGNESIUM SULFATE 4 GM/100ML IV SOLN
4.0000 g | Freq: Once | INTRAVENOUS | Status: AC
  Administered 2017-11-29: 4 g via INTRAVENOUS
  Filled 2017-11-29: qty 100

## 2017-11-29 MED ORDER — SODIUM CHLORIDE 0.9 % IV SOLN
0.4000 ug/kg/h | INTRAVENOUS | Status: DC
  Administered 2017-11-29: 0.7 ug/kg/h via INTRAVENOUS
  Administered 2017-11-29: 0.4 ug/kg/h via INTRAVENOUS
  Filled 2017-11-29 (×3): qty 2

## 2017-11-29 MED ORDER — DEXMEDETOMIDINE HCL IN NACL 400 MCG/100ML IV SOLN
0.4000 ug/kg/h | INTRAVENOUS | Status: DC
Start: 2017-11-29 — End: 2017-12-05
  Administered 2017-11-29: 0.5 ug/kg/h via INTRAVENOUS
  Administered 2017-11-29: 0.7 ug/kg/h via INTRAVENOUS
  Administered 2017-11-30: 0.4 ug/kg/h via INTRAVENOUS
  Administered 2017-11-30: 0.8 ug/kg/h via INTRAVENOUS
  Administered 2017-11-30: 0.25 ug/kg/h via INTRAVENOUS
  Administered 2017-11-30: 0.5 ug/kg/h via INTRAVENOUS
  Administered 2017-12-01 (×2): 0.8 ug/kg/h via INTRAVENOUS
  Administered 2017-12-01: 0.4 ug/kg/h via INTRAVENOUS
  Administered 2017-12-01 – 2017-12-02 (×2): 0.8 ug/kg/h via INTRAVENOUS
  Administered 2017-12-02: 0.6 ug/kg/h via INTRAVENOUS
  Administered 2017-12-02: 0.8 ug/kg/h via INTRAVENOUS
  Administered 2017-12-02: 0.7 ug/kg/h via INTRAVENOUS
  Administered 2017-12-03 (×6): 0.8 ug/kg/h via INTRAVENOUS
  Administered 2017-12-04: 0.6 ug/kg/h via INTRAVENOUS
  Administered 2017-12-04: 0.8 ug/kg/h via INTRAVENOUS
  Filled 2017-11-29 (×4): qty 100
  Filled 2017-11-29: qty 200
  Filled 2017-11-29 (×13): qty 100
  Filled 2017-11-29: qty 200
  Filled 2017-11-29 (×2): qty 100
  Filled 2017-11-29: qty 200

## 2017-11-29 MED ORDER — SODIUM CHLORIDE 0.9% FLUSH: 10.0000 mL | INTRAVENOUS | Status: DC | PRN

## 2017-11-29 MED ORDER — CHLORHEXIDINE GLUCONATE CLOTH 2 % EX PADS
6.0000 | MEDICATED_PAD | Freq: Every day | CUTANEOUS | Status: DC
  Administered 2017-11-29 – 2017-12-01 (×3): 6 via TOPICAL

## 2017-11-29 MED ORDER — FUROSEMIDE 10 MG/ML IJ SOLN
40.0000 mg | Freq: Three times a day (TID) | INTRAMUSCULAR | Status: DC
  Administered 2017-11-29 – 2017-11-30 (×2): 40 mg via INTRAVENOUS
  Filled 2017-11-29 (×3): qty 4

## 2017-11-29 MED ORDER — SODIUM CHLORIDE 0.9% FLUSH
10.0000 mL | Freq: Two times a day (BID) | INTRAVENOUS | Status: DC
  Administered 2017-11-29 – 2017-12-06 (×7): 10 mL

## 2017-11-29 MED ORDER — POTASSIUM CHLORIDE 20 MEQ/15ML (10%) PO SOLN
20.0000 meq | ORAL | Status: AC
  Administered 2017-11-29 (×2): 20 meq
  Filled 2017-11-29 (×3): qty 15

## 2017-11-29 MED ORDER — FLEET ENEMA 7-19 GM/118ML RE ENEM
1.0000 | ENEMA | Freq: Once | RECTAL | Status: AC
  Administered 2017-11-29: 1 via RECTAL
  Filled 2017-11-29: qty 1

## 2017-11-29 NOTE — Progress Notes (Signed)
ANTICOAGULATION CONSULT NOTE   Pharmacy Consult for Heparin Indication: new apical thrombus  Allergies  Allergen Reactions  . Banana     Sick   . Egg Yolk Nausea And Vomiting  . Kiwi Extract Nausea And Vomiting    Patient Measurements: Height: 6\' 3"  (190.5 cm) Weight: 291 lb 10.7 oz (132.3 kg) IBW/kg (Calculated) : 84.5 Heparin Dosing Weight: 116 kg  Vital Signs: Temp: 99.8 F (37.7 C) (02/20 0347) Temp Source: Oral (02/20 0347) BP: 108/79 (02/20 0700) Pulse Rate: 119 (02/20 0700)  Labs: Recent Labs    11/27/17 0330 11/28/17 0415 11/29/17 0344  HGB 11.6* 11.2* 11.2*  HCT 35.7* 35.8* 35.2*  PLT 91* 105* PENDING  HEPARINUNFRC 0.53 0.47 0.39  CREATININE 1.38* 1.24 1.24    Estimated Creatinine Clearance: 92.8 mL/min (by C-G formula based on SCr of 1.24 mg/dL).  Assessment: 62 y.o. male with CHF and apical thrombus. Pharmacy is dosing heparin.   Heparin level is therapeutic at 0.39  on 1750 units/hr. CBC is stable and no signs of bleeding have been noted.    Goal of Therapy:  Heparin level 0.3-0.7 units/ml Monitor platelets by anticoagulation protocol: Yes   Plan:  Continue heparin 1750 units/hr Daily heparin level and CBC Monitor for signs/symptoms of bleeding F/U conversion to oral anticoagulation  Sharin Mons, PharmD, BCPS PGY2 Infectious Diseases Pharmacy Resident Pager: [phone]  11/29/2017 7:18 AM

## 2017-11-29 NOTE — Progress Notes (Addendum)
Nutrition Follow-up  DOCUMENTATION CODES:   Obesity unspecified  INTERVENTION:   Continue:  Vital High Protein at 50 ml/h (1200 ml per day)   Pro-stat 30 ml 5 times per day  Provides 1700 kcal, 180 gm protein, 1003 ml free water daily  NUTRITION DIAGNOSIS:   Inadequate oral intake related to inability to eat as evidenced by NPO status.  Ongoing  GOAL:   Provide needs based on ASPEN/SCCM guidelines  Met with TF  MONITOR:   TF tolerance, Vent status, Labs, Skin, Weight trends, I & O's  ASSESSMENT:   62 yo Male with PMH significant for COPD, asthma, diabetes, CAD status post STEMI/ stent 06/2017, hypertension, and CVA who presented via EMS with complaints of shortness of breath and altered mental status.  Discussed patient with RN today. He is tolerating his TF well, but has not had a BM since 2/12. Bowel regimen was started yesterday. Receiving free water flushes 200 ml every 6 hours.  Patient remains intubated on ventilator support MV: 10.8 L/min Temp (24hrs), Avg:100.1 F (37.8 C), Min:98.8 F (37.1 C), Max:101.3 F (38.5 C)   Propofol discontinued. Labs reviewed. Sodium 149 (H), phosphorus 2.4 (L), magnesium 1.6 (L) CBG's: 106-120 Medications reviewed and include Lasix.   Diet Order:  Diet NPO time specified  EDUCATION NEEDS:   Not appropriate for education at this time  Skin:  Skin Assessment: Skin Integrity Issues: Skin Integrity Issues:: Stage II, Other (Comment) Stage II: R buttocks Other: MASD to groin  Last BM:  2/12  Height:   Ht Readings from Last 1 Encounters:  11/27/17 6' 3"  (1.905 m)    Weight:   Wt Readings from Last 1 Encounters:  11/29/17 291 lb 10.7 oz (132.3 kg)    Ideal Body Weight:  89 kg  BMI:  Body mass index is 36.46 kg/m.  Estimated Nutritional Needs:   Kcal:  1941-7408  Protein:  >/= 178 gm  Fluid:  per MD    Molli Barrows, RD, LDN, [place] Pager [phone] After Hours Pager [phone]

## 2017-11-29 NOTE — Progress Notes (Signed)
Patient's sister, Elvera Lennox, lives in [place].  She asks that her number be in the chart.  Number: [phone].

## 2017-11-29 NOTE — Progress Notes (Signed)
PULMONARY / CRITICAL CARE MEDICINE   Name: Todd Sullivan MRN: [id number] DOB: [birth year]    ADMISSION DATE:  2017-12-17 CONSULTATION DATE:  12-17-17  REFERRING MD: Dr. Wilkie Aye  CHIEF COMPLAINT:  SOB  HISTORY OF PRESENT ILLNESS:   HPI obtained from medical chart review as patient is currently intubated and sedated on mechanical ventilation.  62 year old male with past medical history significant for COPD, asthma, diabetes, CAD status post STEMI/ stent 06/2017, hypertension, and CVA who presented via EMS with complaints of shortness of breath and altered mental status. His significant other reports she has not done well since he MI 3 months ago.  Also reports he has not taken his medications for the last 5 days.  He has not been out of bed for the last 5 days.  Found him after he fell out of bed called 911 and was transported to [facility].  On arrival to the emergency room he was unable to protect his airway and was intubated.  He is to have a CT of his head to rule out CVA and/or trauma since he has had a history of CVA in the past.  On my arrival in the emergency room to trauma a he was sedated with propofol and going to CT scan for evaluation.  Neuro exam was difficult due to high level of sedation.  Difficult due to high level sedation  Note he had profound hypoglycemia otherwise not on insulin but he is on metformin.  He had to be started on dextrose 10% drip to maintain his glucose levels greater than 90.  Labs noted for BUN 53, sCr 3.18 (baseline 1.5-1.6), lactic acid 11.99 -> 9.52, BNP 1886, AST 1073, ALT 804, t. Bili 5.4, WBC 14.4, Plts 107, TSH 0.888, initial CXR showing pulmonary edema   We will admit to the intensive care unit continue to explore laboratory data evaluate CT scan of the head.  Treat underlying lactic acidosis, evaluate for infectious process and cardiology is to evaluate.  SUBJECTIVE / Interval Events:  No acute events overnight. Patient continue to be  sedated and more tachycardic. No sign of hypotension.  VITAL SIGNS: BP 105/70   Pulse (!) 116   Temp 99.8 F (37.7 C) (Oral)   Resp 16   Ht 6\' 3"  (1.905 m)   Wt 291 lb 10.7 oz (132.3 kg)   SpO2 97%   BMI 36.46 kg/m   HEMODYNAMICS:    VENTILATOR SETTINGS: Vent Mode: PRVC FiO2 (%):  [30 %] 30 % Set Rate:  [16 bmp] 16 bmp Vt Set:  [680 mL] 680 mL PEEP:  [5 cmH20] 5 cmH20 Pressure Support:  [14 cmH20] 14 cmH20 Plateau Pressure:  [21 cmH20-25 cmH20] 21 cmH20  INTAKE / OUTPUT: I/O last 3 completed shifts: In: 4331.3 [I.V.:1751.3; NG/GT:2480; IV Piggyback:100] Out: 5235 [Urine:5235]  PHYSICAL EXAMINATION: General: Obese man, ill-appearing, sedated, intubated Neuro:.  Less responsive than over the last few days, tries to open eyes and turns head to voice HEENT: Right IJ CVC in place ET tube in place Cardiovascular: Distant, regular, holosystolic murmur 2/6 Lungs: coarse bilaterally, decreased at both bases musculoskeletal: No deformity Skin: Trace lower extremity edema, improving  LABS:  BMET Recent Labs  Lab 11/27/17 0330 11/28/17 0415 11/29/17 0344  NA 146* 146* 149*  K 3.8 3.3* 3.5  CL 103 103 106  CO2 31 31 30   BUN 48* 45* 47*  CREATININE 1.38* 1.24 1.24  GLUCOSE 120* 108* 102*    Electrolytes Recent Labs  Lab 11/24/17  1610  11/27/17 0330 11/28/17 0415 11/29/17 0344  CALCIUM 8.4*   < > 8.5* 8.5* 8.7*  MG 1.7  --   --   --  1.6*  PHOS  --   --   --   --  2.4*   < > = values in this interval not displayed.    CBC Recent Labs  Lab 11/27/17 0330 11/28/17 0415 11/29/17 0344  WBC 11.1* 11.8* 11.9*  HGB 11.6* 11.2* 11.2*  HCT 35.7* 35.8* 35.2*  PLT 91* 105* PENDING    Coag's Recent Labs  Lab 11/24/17 0446  INR 1.64    Sepsis Markers Recent Labs  Lab 11/22/17 1430 11/22/17 1715  LATICACIDVEN 1.9 1.9    ABG No results for input(s): PHART, PCO2ART, PO2ART in the last 168 hours.  Liver Enzymes Recent Labs  Lab 11/23/17 0524  11/24/17 0446 11/27/17 0330  AST 993* 525* 129*  ALT 947* 646* 261*  ALKPHOS 166* 148* 124  BILITOT 4.7* 4.5* 3.7*  ALBUMIN 2.5* 2.4* 2.4*    Cardiac Enzymes No results for input(s): TROPONINI, PROBNP in the last 168 hours.  Glucose Recent Labs  Lab 11/28/17 0733 11/28/17 1141 11/28/17 1526 11/28/17 1947 11/28/17 2357 11/29/17 0343  GLUCAP 122* 129* 125* 110* 102* 105*    Imaging No results found.   STUDIES:  12/02/2017 CT of the head>>No acute intracranial abnormalities CXR 2/15 >>Progression of bibasilar airspace disease and small pleural effusion.   CULTURES: 11/26/2017 blood cultures x2>>NGTD 11/12/2017 sputum culture>>NGTD 11/27/2017 urine culture>>NGTD 11/22/2017 respiratory>> Candida albicans 11/17/2017 pro-calcitonin>>1.85 11/20/2017 respiratory virus panel>>Negative   ANTIBIOTICS: 2/12 Vancomycin>> 2/13 2/12 Zosyn>>2/13 2/13 Levaquin>>2/14 2/14 Ceftriaxone >>2/18 2/19 Ciprofloxacin gtt >>  SIGNIFICANT EVENTS: 11/28/2017 intubated for respiratory failure  LINES/TUBES: 11/28/2017 endotracheal tube>> 11/29/2017 right internal jugular CVL>>  DISCUSSION: 62 year old morbidly obese male who modality has been smoking recently states he is on NicoDerm patches.  He is noted to be and have been declining over the past 3 months following cardiac cath MI and stent placement.  He has not taken his medication for the last 5 days nor has he been out of bed for the last 5 days.  Significant other noted that he got up and fell during the night EMS was activated his transport to the emergency room he was unable to protect his airway and was intubated.  ASSESSMENT / PLAN:  PULMONARY A: Vent dependent respiratory failure secondary to inability to protect airway Cardiogenic pulmonary edema Probable CAP left lower lobe  P:   Continue current ventilator support Work on pressure support ventilation, will need to lighten sedation also need further volume removal Follow chest  x-ray Continue diuresis, volume optimization   CARDIOVASCULAR A:  Coronary artery disease history with MI 3 (9/18) primary PCI to dominant left circumflex and PDA with synergy drug-eluting stent.   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 Cooxemetry 92.9.  P:  Appreciate cardiology assistance Will discontinue milrinone Continue Bidil tachycardia w/o hypotension Captopril added  Tolerating diuretics, continue same dose of furosemide 2/17  readings, helping with volume mobilization Continue Plavix, heparin drip Off pressors currently, re-add if this will help facilitate diuresis Transition to Coumadin once acute issues stabilize  RENAL A:   Acute renal failure with last known creatinine 1.24>>1.24. Stable. Presume cardiorenal syndrome. Continue to have good UOP. Hypernatremia-149. Free water deficit 4795 ml however given EF 10-15% will replace slowly.  P:   Increase Lasix to 40 mg q8  Free water 200 q6 Follow  urine output,  Adjusting diuretics daily depending on volume status and renal function  GASTROINTESTINAL A:   Transaminitis.  Hepatitis panel negative, right upper quadrant ultrasound without any evidence of acute cholecystitis.  Suspect shock liver.  Improving Morbid obesity GI protection P:   Continue PPI Continue tube feeding as tolerated, note he had emesis 2/16 Follow LFT, coags  HEMATOLOGIC A:  Large apical thrombus seen on Echo 2/14 with akinetic apex.   P:  Continue heparin as ordered Continue plavix Convert to Coumadin when stable from the acute episode   INFECTIOUS A:   Suspected left lower lobe CAP. Cultures and tracheal aspriates have been negative. Patient completed 7 days antibiotic course. Influenza and RVP negative. New rash noted on torso, abdomen, upper and lower extremities. Unclear etiology. Bilateral discharge from eyes concerning bacterial conjunctivitis  P:   Fever curve Started on cipro ophthalmic  solution  ENDOCRINE CBG (last 3)  Recent Labs    11/28/17 1947 11/28/17 2357 11/29/17 0343  GLUCAP 110* 102* 105*    A:   T2DM, A1c 6.5.  Initial hypoglycemia, resolved  P:   Enteral diabetic medications on hold SSI per protocol  NEUROLOGIC A:   Acute encephalopathy, toxic metabolic +possibly substances Cocaine abuse. Minimally responsive.   P:   Will discontinue propofol Start precedex RASS goal: -1 Wean sedation as able.   FAMILY  - Updates: Girlfriend at bedside    Lovena Neighbours, MD [facility], PGY-2 11/29/2017, 6:53 AM

## 2017-11-29 NOTE — Progress Notes (Signed)
[facility] ADULT ICU REPLACEMENT PROTOCOL FOR AM LAB REPLACEMENT ONLY  The patient does apply for the [facility] Adult ICU Electrolyte Replacment Protocol based on the criteria listed below:   1. Is GFR >/= 40 ml/min? Yes.    Patient's GFR today is >60 2. Is urine output >/= 0.5 ml/kg/hr for the last 6 hours? Yes.   Patient's UOP is .91 ml/kg/hr 3. Is BUN < 60 mg/dL? Yes.    Patient's BUN today is 47 4. Abnormal electrolyte(s): K-3.5 5. Ordered repletion with: per protocol 6. If a panic level lab has been reported, has the CCM MD in charge been notified? Yes.  .   Physician:  Dr. Samantha Crimes, [place] Boos 11/29/2017 6:29 AM

## 2017-11-29 NOTE — Progress Notes (Signed)
Subjective:  Remains intubated and sedated No purposeful movement for me today.  2.7 L urine output/24 hrs.  Cr continues to improve. 1.24 today Blood pressure stable. Remains in sinus tachycardia with frequent PVC's Hypernatremia Na 149  Objective:  Vital Signs in the last 24 hours: Temp:  [98.8 F (37.1 C)-101.3 F (38.5 C)] 101.3 F (38.5 C) (02/20 1131) Pulse Rate:  [114-129] 129 (02/20 1000) Resp:  [14-32] 22 (02/20 1000) BP: (91-136)/(58-92) 136/78 (02/20 1000) SpO2:  [92 %-99 %] 96 % (02/20 1000) FiO2 (%):  [30 %] 30 % (02/20 0851) Weight:  [132.3 kg (291 lb 10.7 oz)] 132.3 kg (291 lb 10.7 oz) (02/20 0350)  Intake/Output from previous day: 02/19 0701 - 02/20 0700 In: 2519.7 [I.V.:1069.7; NG/GT:1450] Out: 2725 [Urine:2725] Intake/Output from this shift: Total I/O In: -  Out: 750 [Urine:750]  Net -2.4 L  Physical Exam: Nursing note and vitals reviewed. Constitutional: He appears well-developed.  Morbidly obese. Intubated, sedated   HENT:  Head: Normocephalic.  Eyes: Bilateral eyes purulent discharge, improved.  Neck: Normal range of motion. Neck supple. JVD present.  Cardiovascular: Normal rate and regular rhythm.  Murmur (II/VI holosystolic murmur RLSB) heard. Distant heart sounds.   Respiratory: Effort normal.  B/l diminished breast sounds at bases  GI: Soft. Bowel sounds are normal. He exhibits distension.  Musculoskeletal: Normal range of motion. He exhibits edema (1+ b/l).  Lymphadenopathy:    He has no cervical adenopathy.  Neurological:  Sedated. Detailed examination deferred at this time. Skin: Skin is warm and dry.  Psychiatric:  Not assessed     Lab Results: Recent Labs    11/28/17 0415 11/29/17 0344  WBC 11.8* 11.9*  HGB 11.2* 11.2*  PLT 105* 127*   Recent Labs    11/28/17 0415 11/29/17 0344  NA 146* 149*  K 3.3* 3.5  CL 103 106  CO2 31 30  GLUCOSE 108* 102*  BUN 45* 47*  CREATININE 1.24 1.24   No results for input(s):  TROPONINI in the last 72 hours.  Invalid input(s): CK, MB Hepatic Function Panel Recent Labs    11/27/17 0330  PROT 5.9*  ALBUMIN 2.4*  AST 129*  ALT 261*  ALKPHOS 124  BILITOT 3.7*   Echocardiogram 11/23/2017 Study Conclusions   - Left ventricle: The cavity size was moderately dilated. Wall   thickness was increased in a pattern of mild LVH. Systolic   function was severely reduced. The estimated ejection fraction   was in the range of 10% to 15%. Diffuse hypokinesis. Akinesis of   the apical myocardium. The study is not technically sufficient to   allow evaluation of LV diastolic function. Acoustic contrast   opacification revealed a large, apicalthrombusassociated with an   akinetic segment. - Ventricular septum: The contour showed diastolic flattening and   systolic flattening. These changes are consistent with RV volume   and pressure overload. - Aortic valve: There was trivial regurgitation. - Mitral valve: There was mild to moderate regurgitation. - Left atrium: The atrium was severely dilated. - Right ventricle: The cavity size was severely dilated. Systolic   function was severely reduced. - Right atrium: The atrium was severely dilated. - Atrial septum: No defect or patent foramen ovale was identified. - Tricuspid valve: There was moderate regurgitation.  Assessment: Acute systolic heart failure, resolving cardiogenic shock Good urine output on low dose diuretics.  LV apical thrombus Acute Respiratory failure. Difficulty weaning. Sepsis, likely due to pneumonia B/l conjunctivitis?: On topical antibiotics AKI/CKD: Resolving. Cr 1.24  today Lactic acid elevation, resolved Elevated liver enzymes: Likely shock liver. Now improving Mild troponin elevation: Type 2 MI CAD status post  primary PCI LCx PDA Synergy DES 2.5 X 16 mm 07/09/2017 H/o Hypertension Type 2 DM Hyperlipidemia Morbid obesity Tobacco abuse Polysubstance abuse, including cocaine positive this  admission H/o CVA   Recommendations:  Continue renally dosed milrinone. Continue lasix 40 mg IV bid.  Agree with decreasing milrinone. Should be able to wean off in next 48 hrs.  Continue bidil 1/2 tab of 20-37.5 mg tid and captopril 6.25 mg tid for afterload reduction. Agree with weaning off milrinone. Recommend starting bisoprolol 2.5 mg on 11/30/2017 Agree with free water and lasix 40 mg tid given hypernatremia.  Continue heparin, bridge to warfarin when possible. Continue plavix.  With his morbid obesity, history of medication noncompliance, h/o CVA, and most importantly substance abuse with urine positive for cocaine, he is not a candidate for any advanced heart failure therapy. I would not recommend escalation for his cardiac care beyond management with temporary inotropic support.   Prognosis remains guarded. I have discussed his current cardiac condition and prognosis with his partner Jeannine throughout this admission.  She is not his next of kin and not married to him.  His next of kin includes a son who lives in town, a son lives in [place], and a sister.     LOS: 8 days    Zared Knoth J Sanaiyah Kirchhoff 11/29/2017, 12:01 PM  Mikias Lanz Emiliano Dyer, MD [facility] [place] Pager: [phone] Office: [phone] If no answer Cell [phone]

## 2017-11-30 ENCOUNTER — Inpatient Hospital Stay (HOSPITAL_COMMUNITY): Payer: Medicare Other

## 2017-11-30 LAB — CBC
HEMATOCRIT: 36.7 % — AB (ref 39.0–52.0)
Hemoglobin: 11.4 g/dL — ABNORMAL LOW (ref 13.0–17.0)
MCH: 25.2 pg — ABNORMAL LOW (ref 26.0–34.0)
MCHC: 31.1 g/dL (ref 30.0–36.0)
MCV: 81 fL (ref 78.0–100.0)
PLATELETS: 138 10*3/uL — AB (ref 150–400)
RBC: 4.53 MIL/uL (ref 4.22–5.81)
RDW: 22.6 % — AB (ref 11.5–15.5)
WBC: 11 10*3/uL — AB (ref 4.0–10.5)

## 2017-11-30 LAB — GLUCOSE, CAPILLARY
GLUCOSE-CAPILLARY: 126 mg/dL — AB (ref 65–99)
GLUCOSE-CAPILLARY: 123 mg/dL — AB (ref 65–99)
Glucose-Capillary: 106 mg/dL — ABNORMAL HIGH (ref 65–99)
Glucose-Capillary: 145 mg/dL — ABNORMAL HIGH (ref 65–99)
Glucose-Capillary: 153 mg/dL — ABNORMAL HIGH (ref 65–99)
Glucose-Capillary: 161 mg/dL — ABNORMAL HIGH (ref 65–99)

## 2017-11-30 LAB — COOXEMETRY PANEL
CARBOXYHEMOGLOBIN: 1.6 % — AB (ref 0.5–1.5)
METHEMOGLOBIN: 1 % (ref 0.0–1.5)
O2 Saturation: 88.2 %
TOTAL HEMOGLOBIN: 11.9 g/dL — AB (ref 12.0–16.0)

## 2017-11-30 LAB — BASIC METABOLIC PANEL
Anion gap: 13 (ref 5–15)
BUN: 61 mg/dL — ABNORMAL HIGH (ref 6–20)
CO2: 27 mmol/L (ref 22–32)
CREATININE: 1.36 mg/dL — AB (ref 0.61–1.24)
Calcium: 8.6 mg/dL — ABNORMAL LOW (ref 8.9–10.3)
Chloride: 109 mmol/L (ref 101–111)
GFR calc non Af Amer: 55 mL/min — ABNORMAL LOW (ref >60)
Glucose, Bld: 154 mg/dL — ABNORMAL HIGH (ref 65–99)
POTASSIUM: 3.8 mmol/L (ref 3.5–5.1)
SODIUM: 149 mmol/L — AB (ref 135–145)

## 2017-11-30 LAB — COMPREHENSIVE METABOLIC PANEL
ALBUMIN: 2.4 g/dL — AB (ref 3.5–5.0)
ALT: 116 U/L — ABNORMAL HIGH (ref 17–63)
ANION GAP: 14 (ref 5–15)
AST: 59 U/L — AB (ref 15–41)
Alkaline Phosphatase: 109 U/L (ref 38–126)
BILIRUBIN TOTAL: 2.2 mg/dL — AB (ref 0.3–1.2)
BUN: 55 mg/dL — AB (ref 6–20)
CHLORIDE: 108 mmol/L (ref 101–111)
CO2: 27 mmol/L (ref 22–32)
Calcium: 8.7 mg/dL — ABNORMAL LOW (ref 8.9–10.3)
Creatinine, Ser: 1.45 mg/dL — ABNORMAL HIGH (ref 0.61–1.24)
GFR calc Af Amer: 59 mL/min — ABNORMAL LOW (ref >60)
GFR calc non Af Amer: 51 mL/min — ABNORMAL LOW (ref >60)
GLUCOSE: 132 mg/dL — AB (ref 65–99)
POTASSIUM: 4.1 mmol/L (ref 3.5–5.1)
SODIUM: 149 mmol/L — AB (ref 135–145)
Total Protein: 6 g/dL — ABNORMAL LOW (ref 6.5–8.1)

## 2017-11-30 LAB — HEPARIN LEVEL (UNFRACTIONATED): Heparin Unfractionated: 0.56 IU/mL (ref 0.30–0.70)

## 2017-11-30 LAB — PROCALCITONIN: Procalcitonin: 0.63 ng/mL

## 2017-11-30 MED ORDER — FUROSEMIDE 10 MG/ML IJ SOLN: 40.0000 mg | Freq: Two times a day (BID) | INTRAMUSCULAR | Status: DC

## 2017-11-30 MED ORDER — BISOPROLOL FUMARATE 5 MG PO TABS
2.5000 mg | ORAL_TABLET | Freq: Every day | ORAL | Status: DC
  Filled 2017-11-30: qty 0.5

## 2017-11-30 MED ORDER — LACTULOSE 10 GM/15ML PO SOLN
30.0000 g | Freq: Once | ORAL | Status: AC
  Administered 2017-11-30: 30 g
  Filled 2017-11-30: qty 45

## 2017-11-30 MED ORDER — PREDNISONE 5 MG/5ML PO SOLN: 20.0000 mg | Freq: Two times a day (BID) | ORAL | Status: DC

## 2017-11-30 MED ORDER — PREDNISONE 5 MG/5ML PO SOLN
20.0000 mg | Freq: Two times a day (BID) | ORAL | Status: DC
  Administered 2017-11-30 – 2017-12-01 (×3): 20 mg
  Filled 2017-11-30 (×4): qty 20

## 2017-11-30 MED ORDER — PREDNISONE 5 MG/5ML PO SOLN
20.0000 mg | Freq: Two times a day (BID) | ORAL | Status: DC
  Filled 2017-11-30: qty 20

## 2017-11-30 NOTE — Progress Notes (Signed)
  Independently examined pt, evaluated data & formulated above care plan with NP/resident   62 year old obese man admitted with 5 days of weakness and encephalopathy, hypoglycemia and transaminitis. with diabetes, COPD, coronary artery disease, history of CVA and hypertension, cocaine use.  He had a left lower lobe consolidation that is treated as a  community acquired pneumonia.  He was found to have an EF of 10% with an LV thrombus, treated with milrinone for 48 hours  On exam-awake on Precedex, follows commands, decreased breath sounds bilateral, mild secretions, S1-S2 normal, decreased pedal edema. Mild erythematous drug rash over her neck arms, thighs, less prominent on torso  Chest x-ray personally reviewed shows bibasilar infiltrates. Labs show increasing sodium and creatinine, diuresed well with Lasix.  Impression/plan Acute respiratory failure-continue spontaneous breathing trials with goal of extubation, would like to see him past 30-minute trial on T-piece weaning before extubation.  Cocaine cardiomyopathy/CAD-cardiogenic shock resolved -hydralazine nitrates and captopril being optimized by cardiology, beta-blocker and hold today due to low blood pressure  LV thrombus-continue IV heparin Community acquired pneumonia -has been treated with 7 days of ceftriaxone now start.  Rash-unclear cause, trying to minimize medications, start 40 mg of prednisone daily  Prognosis guarded, goal would be to liberate him from mechanical ventilation, not a candidate for advanced heart failure therapies  The patient is critically ill with multiple organ systems failure and requires high complexity decision making for assessment and support, frequent evaluation and titration of therapies, application of advanced monitoring technologies and extensive interpretation of multiple databases. Critical Care Time devoted to patient care services described in this note independent of APP/resident  time is 35  minutes.    Comer Locket Vassie Loll MD

## 2017-11-30 NOTE — Progress Notes (Signed)
Subjective:  Remains intubated and sedated No purposeful movement for me today.  2.9 L urine output/24 hrs.  Cr increased to 1.45 today  Hypernatremia Na 149 Tachycardia improved after starting precedex. Blood pressure lower today.   Objective:  Vital Signs in the last 24 hours: Temp:  [98 F (36.7 C)-101.3 F (38.5 C)] 98.4 F (36.9 C) (02/21 0750) Pulse Rate:  [78-181] 90 (02/21 0800) Resp:  [14-28] 28 (02/21 0800) BP: (79-136)/(56-78) 92/70 (02/21 0800) SpO2:  [90 %-100 %] 97 % (02/21 0800) FiO2 (%):  [30 %] 30 % (02/21 0800) Weight:  [131 kg (288 lb 12.8 oz)] 131 kg (288 lb 12.8 oz) (02/21 0346)  Intake/Output from previous day: 02/20 0701 - 02/21 0700 In: 3039.1 [I.V.:1089.1; NG/GT:1850; IV Piggyback:100] Out: 2900 [Urine:2900] Intake/Output from this shift: Total I/O In: 285.8 [I.V.:35.8; NG/GT:250] Out: 125 [Urine:125]  Net -2.4 L  Physical Exam: Nursing note and vitals reviewed. Constitutional: He appears well-developed.  Morbidly obese. Intubated, sedated   HENT:  Head: Normocephalic.  Eyes: Bilateral eyes purulent discharge, improved.  Neck: Normal range of motion. Neck supple. JVD present.  Cardiovascular: Normal rate and regular rhythm.  Murmur (II/VI holosystolic murmur RLSB) heard. Distant heart sounds.   Respiratory: Effort normal.  B/l diminished breast sounds at bases  GI: Soft. Bowel sounds are normal. He exhibits distension.  Musculoskeletal: Normal range of motion. He exhibits edema (1+ b/l).  Lymphadenopathy:    He has no cervical adenopathy.  Neurological:  Sedated. Detailed examination deferred at this time. Skin: Skin is warm and dry.  Psychiatric:  Not assessed     Lab Results: Recent Labs    11/29/17 0344 11/30/17 0331  WBC 11.9* 11.0*  HGB 11.2* 11.4*  PLT 127* 138*   Recent Labs    11/29/17 0344 11/30/17 0331  NA 149* 149*  K 3.5 4.1  CL 106 108  CO2 30 27  GLUCOSE 102* 132*  BUN 47* 55*  CREATININE 1.24 1.45*    No results for input(s): TROPONINI in the last 72 hours.  Invalid input(s): CK, MB Hepatic Function Panel Recent Labs    11/30/17 0331  PROT 6.0*  ALBUMIN 2.4*  AST 59*  ALT 116*  ALKPHOS 109  BILITOT 2.2*   Echocardiogram 11/23/2017 Study Conclusions   - Left ventricle: The cavity size was moderately dilated. Wall   thickness was increased in a pattern of mild LVH. Systolic   function was severely reduced. The estimated ejection fraction   was in the range of 10% to 15%. Diffuse hypokinesis. Akinesis of   the apical myocardium. The study is not technically sufficient to   allow evaluation of LV diastolic function. Acoustic contrast   opacification revealed a large, apicalthrombusassociated with an   akinetic segment. - Ventricular septum: The contour showed diastolic flattening and   systolic flattening. These changes are consistent with RV volume   and pressure overload. - Aortic valve: There was trivial regurgitation. - Mitral valve: There was mild to moderate regurgitation. - Left atrium: The atrium was severely dilated. - Right ventricle: The cavity size was severely dilated. Systolic   function was severely reduced. - Right atrium: The atrium was severely dilated. - Atrial septum: No defect or patent foramen ovale was identified. - Tricuspid valve: There was moderate regurgitation.  Assessment: Acute systolic heart failure, resolving cardiogenic shock Good urine output on low dose diuretics.  LV apical thrombus Acute Respiratory failure. Difficulty weaning. Sepsis, likely due to pneumonia B/l conjunctivitis?: On topical antibiotics AKI/CKD:  Cr 1.45 today Lactic acid elevation, resolved Elevated liver enzymes: Likely shock liver. Now improving Mild troponin elevation: Type 2 MI CAD status post  primary PCI LCx PDA Synergy DES 2.5 X 16 mm 07/09/2017 H/o Hypertension Type 2 DM Hyperlipidemia Morbid obesity Tobacco abuse Polysubstance abuse, including  cocaine positive this admission H/o CVA   Recommendations:  Milrinone weaned off.  Continue bidil 1/2 tab of 20-37.5 mg tid and captopril 6.25 mg tid for afterload reduction.  HR and BP lower with precedex, suggesting tachycardia could be due to anxiety. With lower blood pressure, I would hold beta blocker today. Will readdress on 02/22. With increasing cr and hypernatremia, recommend holding lasix this morning. Assess Cr and volume status this afternoon (02/21) and give lasix 40 mg prn. Agree with cautious use of free water  Continue heparin, bridge to warfarin when possible. Continue plavix.  With his morbid obesity, history of medication noncompliance, h/o CVA, and most importantly substance abuse with urine positive for cocaine, he is not a candidate for any advanced heart failure therapy. I would not recommend escalation for his cardiac care beyond management with temporary inotropic support.   Prognosis remains guarded. I have discussed his current cardiac condition and prognosis with his partner Jeannine throughout this admission.  She is not his next of kin and not married to him.  His next of kin includes a son who lives in town, a son lives in [place], and a sister.     LOS: 9 days    Reeve Turnley J Rolena Knutson 11/30/2017, 9:27 AM  Jesly Hartmann Emiliano Dyer, MD [facility] Pager: [phone] Office: [phone] If no answer Cell [phone]

## 2017-11-30 NOTE — Progress Notes (Signed)
ANTICOAGULATION CONSULT NOTE   Pharmacy Consult for Heparin Indication: new apical thrombus  Allergies  Allergen Reactions  . Banana     Sick   . Egg Yolk Nausea And Vomiting  . Kiwi Extract Nausea And Vomiting    Patient Measurements: Height: 6\' 3"  (190.5 cm) Weight: 288 lb 12.8 oz (131 kg) IBW/kg (Calculated) : 84.5 Heparin Dosing Weight: 116 kg  Vital Signs: Temp: 100.9 F (38.3 C) (02/21 0402) Temp Source: Oral (02/21 0402) BP: 82/59 (02/21 0710) Pulse Rate: 85 (02/21 0710)  Labs: Recent Labs    11/28/17 0415 11/29/17 0344 11/30/17 0331  HGB 11.2* 11.2* 11.4*  HCT 35.8* 35.2* 36.7*  PLT 105* 127* 138*  HEPARINUNFRC 0.47 0.39 0.56  CREATININE 1.24 1.24 1.45*    Estimated Creatinine Clearance: 79 mL/min (A) (by C-G formula based on SCr of 1.45 mg/dL (H)).  Assessment: 62 y.o. male with CHF and apical thrombus. Pharmacy is dosing heparin.   Heparin level is therapeutic at 0.56  on 1750 units/hr. CBC is stable and no signs of bleeding have been noted.    Goal of Therapy:  Heparin level 0.3-0.7 units/ml Monitor platelets by anticoagulation protocol: Yes   Plan:  Continue heparin 1750 units/hr Daily heparin level and CBC Monitor for signs/symptoms of bleeding F/U conversion to oral anticoagulation  Sharin Mons, PharmD, BCPS PGY2 Infectious Diseases Pharmacy Resident Pager: [phone]  11/30/2017 7:48 AM

## 2017-11-30 NOTE — Care Management Note (Signed)
Case Management Note  Patient Details  Name: Todd Sullivan MRN: [id number] Date of Birth: [birth year]  Subjective/Objective:  Admitted with severe encephalopathy                  Action/Plan:   PTA independent from home - however has experienced decline for last couple of months.  Pt will need PT eval once extubated    Expected Discharge Date:                  Expected Discharge Plan:  Home/Self Care  In-House Referral:     Discharge planning Services  CM Consult  Post Acute Care Choice:    Choice offered to:     DME Arranged:    DME Agency:     HH Arranged:    HH Agency:     Status of Service:     If discussed at [employer], dates discussed:    Additional Comments: 11/30/2017 Pt remains  on ventilator on Precedex drip  with poor prognosis.  GOC ongoing Cherylann Parr, RN 11/30/2017, 4:22 PM

## 2017-11-30 NOTE — Progress Notes (Signed)
PULMONARY / CRITICAL CARE MEDICINE   Name: Todd Sullivan MRN: [id number] DOB: [birth year]    ADMISSION DATE:  01-Dec-2017 CONSULTATION DATE:  12/01/2017  REFERRING MD: Dr. Wilkie Aye  CHIEF COMPLAINT:  SOB  HISTORY OF PRESENT ILLNESS:   HPI obtained from medical chart review as patient is currently intubated and sedated on mechanical ventilation.  62 year old male with past medical history significant for COPD, asthma, diabetes, CAD status post STEMI/ stent 06/2017, hypertension, and CVA who presented via EMS with complaints of shortness of breath and altered mental status. His significant other reports she has not done well since he MI 3 months ago.  Also reports he has not taken his medications for the last 5 days.  He has not been out of bed for the last 5 days.  Found him after he fell out of bed called 911 and was transported to [facility].  On arrival to the emergency room he was unable to protect his airway and was intubated.  He is to have a CT of his head to rule out CVA and/or trauma since he has had a history of CVA in the past.  On my arrival in the emergency room to trauma a he was sedated with propofol and going to CT scan for evaluation.  Neuro exam was difficult due to high level of sedation.  Difficult due to high level sedation  Note he had profound hypoglycemia otherwise not on insulin but he is on metformin.  He had to be started on dextrose 10% drip to maintain his glucose levels greater than 90.  Labs noted for BUN 53, sCr 3.18 (baseline 1.5-1.6), lactic acid 11.99 -> 9.52, BNP 1886, AST 1073, ALT 804, t. Bili 5.4, WBC 14.4, Plts 107, TSH 0.888, initial CXR showing pulmonary edema   We will admit to the intensive care unit continue to explore laboratory data evaluate CT scan of the head.  Treat underlying lactic acidosis, evaluate for infectious process and cardiology is to evaluate.  SUBJECTIVE / Interval Events:  No acute events overnight. Patient continue to be  sedated, febrile overnight with developing rash on torso and upper extremities.  VITAL SIGNS: BP 93/68   Pulse 81   Temp (!) 100.9 F (38.3 C) (Oral)   Resp 16   Ht 6\' 3"  (1.905 m)   Wt 288 lb 12.8 oz (131 kg)   SpO2 99%   BMI 36.10 kg/m   HEMODYNAMICS:    VENTILATOR SETTINGS: Vent Mode: PRVC FiO2 (%):  [30 %] 30 % Set Rate:  [16 bmp] 16 bmp Vt Set:  [600 mL-680 mL] 600 mL PEEP:  [5 cmH20] 5 cmH20 Pressure Support:  [14 cmH20] 14 cmH20 Plateau Pressure:  [18 cmH20-23 cmH20] 18 cmH20  INTAKE / OUTPUT: I/O last 3 completed shifts: In: 4087.7 [I.V.:1637.7; NG/GT:2350; IV Piggyback:100] Out: 4100 [Urine:4100]  PHYSICAL EXAMINATION: General: Obese man, ill-appearing, sedated, intubated Neuro:.  Less responsive than over the last few days, tries to open eyes and turns head to voice HEENT: Right IJ CVC in place ET tube in place Cardiovascular: Distant, regular, holosystolic murmur 2/6 Lungs: coarse bilaterally, decreased at both bases musculoskeletal: No deformity Skin: Trace lower extremity edema, improving  LABS:  BMET Recent Labs  Lab 11/28/17 0415 11/29/17 0344 11/30/17 0331  NA 146* 149* 149*  K 3.3* 3.5 4.1  CL 103 106 108  CO2 31 30 27   BUN 45* 47* 55*  CREATININE 1.24 1.24 1.45*  GLUCOSE 108* 102* 132*    Electrolytes Recent  Labs  Lab 11/24/17 0446  11/28/17 0415 11/29/17 0344 11/30/17 0331  CALCIUM 8.4*   < > 8.5* 8.7* 8.7*  MG 1.7  --   --  1.6*  --   PHOS  --   --   --  2.4*  --    < > = values in this interval not displayed.    CBC Recent Labs  Lab 11/28/17 0415 11/29/17 0344 11/30/17 0331  WBC 11.8* 11.9* 11.0*  HGB 11.2* 11.2* 11.4*  HCT 35.8* 35.2* 36.7*  PLT 105* 127* 138*    Coag's Recent Labs  Lab 11/24/17 0446  INR 1.64    Sepsis Markers No results for input(s): LATICACIDVEN, PROCALCITON, O2SATVEN in the last 168 hours.  ABG No results for input(s): PHART, PCO2ART, PO2ART in the last 168 hours.  Liver  Enzymes Recent Labs  Lab 11/24/17 0446 11/27/17 0330 11/30/17 0331  AST 525* 129* 59*  ALT 646* 261* 116*  ALKPHOS 148* 124 109  BILITOT 4.5* 3.7* 2.2*  ALBUMIN 2.4* 2.4* 2.4*    Cardiac Enzymes No results for input(s): TROPONINI, PROBNP in the last 168 hours.  Glucose Recent Labs  Lab 11/29/17 0808 11/29/17 1131 11/29/17 1536 11/29/17 1951 11/29/17 2335 11/30/17 0401  GLUCAP 106* 120* 105* 130* 98 106*    Imaging No results found.   STUDIES:  12/09/2017 CT of the head>>No acute intracranial abnormalities CXR 2/15 >>Progression of bibasilar airspace disease and small pleural effusion.   CULTURES: 2017-12-09 blood cultures x2>>NGTD 12/09/2017 sputum culture>>NGTD 12-09-2017 urine culture>>NGTD 12/09/2017 respiratory>> Candida albicans 2017/12/09 pro-calcitonin>>1.85 09-Dec-2017 respiratory virus panel>>Negative   ANTIBIOTICS: 2/13 Levaquin>>2/14 2/14 Ceftriaxone >>2/18 2/19 Ciprofloxacin gtt >>  SIGNIFICANT EVENTS: 12-09-2017 intubated for respiratory failure  LINES/TUBES: 2017/12/09 endotracheal tube>> 12/09/17 right internal jugular CVL>>  DISCUSSION: 62 year old morbidly obese male who modality has been smoking recently states he is on NicoDerm patches.  He is noted to be and have been declining over the past 3 months following cardiac cath MI and stent placement.  He has not taken his medication for the last 5 days nor has he been out of bed for the last 5 days.  Significant other noted that he got up and fell during the night EMS was activated his transport to the emergency room he was unable to protect his airway and was intubated.  ASSESSMENT / PLAN:  PULMONARY A: Vent dependent respiratory failure secondary to inability to protect airway Cardiogenic pulmonary edema Probable CAP left lower lobe  P:   Continue current ventilator support Work on pressure support ventilation and SBT Follow chest x-ray Continue diuresis, volume  optimization   CARDIOVASCULAR A:  Coronary artery disease history with MI 3 (9/18) primary PCI to dominant left circumflex and PDA with synergy drug-eluting stent.   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 Cooxemetry 92.9.  P:  Appreciate cardiology assistance Discontinue milrinone Hold bisoprolol today, consider resuming tomorrow Continue Bidil  Continue Captopril Hold furosemide 40 mg tid Continue Plavix, heparin drip Transition to Coumadin once acute issues stabilize  RENAL A:   Acute renal failure with last known creatinine 1.24>>1.45. Stable. Presume cardiorenal syndrome. Continue to have good UOP. Hypernatremia-149. Free water deficit 4795 ml however given EF 10-15% will replace slowly.  P:   Increase Lasix to 40 mg q8  Free water 200 q6 Follow urine output,  Adjusting diuretics daily depending on volume status and renal function.   GASTROINTESTINAL A:   Transaminitis. Resolved. Hepatitis panel negative, RUQ [place] without any  evidence of acute cholecystitis.  Suspect shock liver.   Morbid obesity GI protection P:   Continue PPI Continue tube feeding as tolerated, note he had emesis 2/16 Follow LFT, coags  HEMATOLOGIC A:  Large apical thrombus seen on Echo 2/14 with akinetic apex.   P:  Continue heparin as ordered Continue plavix Convert to Coumadin when stable from the acute episode   INFECTIOUS A:   Suspected left lower lobe CAP. Cultures and tracheal aspriates have been negative. Patient completed 7 days antibiotic course. Influenza and RVP negative. New rash noted on torso, abdomen, upper and lower extremities. Unclear etiology. Bilateral discharge from eyes concerning bacterial conjunctivitis Patient continue to be febrile   P:   Prednisone 20 mg bid for two days  Fever curve Started on cipro ophthalmic solution  ENDOCRINE CBG (last 3)  Recent Labs    11/29/17 1951 11/29/17 2335 11/30/17 0401  GLUCAP 130* 98 106*     A:   T2DM, A1c 6.5.  Initial hypoglycemia, resolved  P:   Enteral diabetic medications on hold SSI per protocol  NEUROLOGIC A:   Acute encephalopathy, toxic metabolic +possibly substances Cocaine abuse. Minimally responsive.   P:   Will discontinue propofol Wean precedex RASS goal: -1 Wean sedation as able.   FAMILY  - Updates: Girlfriend at bedside daily    Lovena Neighbours, MD [facility], PGY-2 11/30/2017, 7:04 AM

## 2017-12-01 ENCOUNTER — Inpatient Hospital Stay: Payer: Self-pay

## 2017-12-01 ENCOUNTER — Inpatient Hospital Stay (HOSPITAL_COMMUNITY): Payer: Medicare Other

## 2017-12-01 LAB — BASIC METABOLIC PANEL
Anion gap: 13 (ref 5–15)
BUN: 61 mg/dL — AB (ref 6–20)
CHLORIDE: 108 mmol/L (ref 101–111)
CO2: 27 mmol/L (ref 22–32)
CREATININE: 1.38 mg/dL — AB (ref 0.61–1.24)
Calcium: 8.8 mg/dL — ABNORMAL LOW (ref 8.9–10.3)
GFR calc Af Amer: >60 mL/min (ref >60)
GFR calc non Af Amer: 54 mL/min — ABNORMAL LOW (ref >60)
GLUCOSE: 159 mg/dL — AB (ref 65–99)
POTASSIUM: 4 mmol/L (ref 3.5–5.1)
Sodium: 148 mmol/L — ABNORMAL HIGH (ref 135–145)

## 2017-12-01 LAB — CBC
HEMATOCRIT: 38.2 % — AB (ref 39.0–52.0)
Hemoglobin: 12.2 g/dL — ABNORMAL LOW (ref 13.0–17.0)
MCH: 26.1 pg (ref 26.0–34.0)
MCHC: 31.9 g/dL (ref 30.0–36.0)
MCV: 81.8 fL (ref 78.0–100.0)
Platelets: 156 10*3/uL (ref 150–400)
RBC: 4.67 MIL/uL (ref 4.22–5.81)
RDW: 22.9 % — AB (ref 11.5–15.5)
WBC: 11.4 10*3/uL — AB (ref 4.0–10.5)

## 2017-12-01 LAB — COOXEMETRY PANEL
CARBOXYHEMOGLOBIN: 2 % — AB (ref 0.5–1.5)
Methemoglobin: 0.7 % (ref 0.0–1.5)
O2 SAT: 87.9 %
TOTAL HEMOGLOBIN: 12.4 g/dL (ref 12.0–16.0)

## 2017-12-01 LAB — PROCALCITONIN: Procalcitonin: 0.42 ng/mL

## 2017-12-01 LAB — GLUCOSE, CAPILLARY
GLUCOSE-CAPILLARY: 141 mg/dL — AB (ref 65–99)
GLUCOSE-CAPILLARY: 142 mg/dL — AB (ref 65–99)
Glucose-Capillary: 146 mg/dL — ABNORMAL HIGH (ref 65–99)
Glucose-Capillary: 161 mg/dL — ABNORMAL HIGH (ref 65–99)
Glucose-Capillary: 175 mg/dL — ABNORMAL HIGH (ref 65–99)

## 2017-12-01 LAB — HEPARIN LEVEL (UNFRACTIONATED): HEPARIN UNFRACTIONATED: 0.6 [IU]/mL (ref 0.30–0.70)

## 2017-12-01 MED ORDER — ISOSORB DINITRATE-HYDRALAZINE 20-37.5 MG PO TABS
1.0000 | ORAL_TABLET | Freq: Three times a day (TID) | ORAL | Status: DC
  Administered 2017-12-01 – 2017-12-04 (×10): 1
  Filled 2017-12-01 (×11): qty 1

## 2017-12-01 MED ORDER — SODIUM CHLORIDE 0.9% FLUSH
10.0000 mL | INTRAVENOUS | Status: DC | PRN
  Administered 2017-12-07: 10 mL
  Filled 2017-12-01: qty 40

## 2017-12-01 MED ORDER — SORBITOL 70 % SOLN
960.0000 mL | TOPICAL_OIL | Freq: Once | ORAL | Status: AC
  Administered 2017-12-01: 960 mL via RECTAL
  Filled 2017-12-01: qty 473

## 2017-12-01 MED ORDER — PREDNISONE 20 MG PO TABS
20.0000 mg | ORAL_TABLET | Freq: Two times a day (BID) | ORAL | Status: AC
  Administered 2017-12-01 – 2017-12-02 (×3): 20 mg via ORAL
  Filled 2017-12-01 (×3): qty 1

## 2017-12-01 MED ORDER — CHLORHEXIDINE GLUCONATE CLOTH 2 % EX PADS: 6.0000 | MEDICATED_PAD | Freq: Every day | CUTANEOUS | Status: DC

## 2017-12-01 MED ORDER — FENTANYL CITRATE (PF) 100 MCG/2ML IJ SOLN: 25.0000 ug | INTRAMUSCULAR | Status: DC | PRN

## 2017-12-01 MED ORDER — FUROSEMIDE 10 MG/ML IJ SOLN
40.0000 mg | Freq: Every day | INTRAMUSCULAR | Status: DC
  Administered 2017-12-01 – 2017-12-03 (×3): 40 mg via INTRAVENOUS
  Filled 2017-12-01 (×4): qty 4

## 2017-12-01 MED ORDER — SODIUM CHLORIDE 0.9% FLUSH
10.0000 mL | Freq: Two times a day (BID) | INTRAVENOUS | Status: DC
  Administered 2017-12-01 – 2017-12-06 (×8): 10 mL

## 2017-12-01 NOTE — Progress Notes (Signed)
Patient noted to have more redness and rash after CHG wipes MD notified

## 2017-12-01 NOTE — Progress Notes (Signed)
Called family members fpr PICC placement consent with no answer.

## 2017-12-01 NOTE — Progress Notes (Signed)
RN notified of pt temp of 100.9 orally.

## 2017-12-01 NOTE — Progress Notes (Signed)
ANTICOAGULATION CONSULT NOTE   Pharmacy Consult for Heparin Indication: new apical thrombus  Allergies  Allergen Reactions  . Banana     Sick   . Egg Yolk Nausea And Vomiting  . Kiwi Extract Nausea And Vomiting    Patient Measurements: Height: 6\' 3"  (190.5 cm) Weight: 289 lb 7.4 oz (131.3 kg) IBW/kg (Calculated) : 84.5 Heparin Dosing Weight: 116 kg  Vital Signs: Temp: 99 F (37.2 C) (02/22 0327) Temp Source: Oral (02/22 0327) BP: 102/72 (02/22 0600) Pulse Rate: 77 (02/22 0600)  Labs: Recent Labs    11/29/17 0344 11/30/17 0331 11/30/17 1803 12/01/17 0400 12/01/17 0415  HGB 11.2* 11.4*  --  12.2*  --   HCT 35.2* 36.7*  --  38.2*  --   PLT 127* 138*  --  156  --   HEPARINUNFRC 0.39 0.56  --   --  0.60  CREATININE 1.24 1.45* 1.36* 1.38*  --     Estimated Creatinine Clearance: 83.1 mL/min (A) (by C-G formula based on SCr of 1.38 mg/dL (H)).  Assessment: 62 y.o. Sullivan with CHF and apical thrombus. Pharmacy is dosing heparin.   Heparin level is therapeutic at 0.60  on 1750 units/hr. CBC is stable and no signs of bleeding have been noted.    Goal of Therapy:  Heparin level 0.3-0.7 units/ml Monitor platelets by anticoagulation protocol: Yes   Plan:  Continue heparin 1750 units/hr Daily heparin level and CBC Monitor for signs/symptoms of bleeding F/U conversion to oral anticoagulation  Sharin Mons, PharmD, BCPS PGY2 Infectious Diseases Pharmacy Resident Pager: [phone]  12/01/2017 7:11 AM

## 2017-12-01 NOTE — Progress Notes (Signed)
RN notified of pt temp of 100.6 F orally. 

## 2017-12-01 NOTE — Progress Notes (Signed)
Peripherally Inserted Central Catheter/Midline Placement  The IV Nurse has discussed with the patient and/or persons authorized to consent for the patient, the purpose of this procedure and the potential benefits and risks involved with this procedure.  The benefits include less needle sticks, lab draws from the catheter, and the patient may be discharged home with the catheter. Risks include, but not limited to, infection, bleeding, blood clot (thrombus formation), and puncture of an artery; nerve damage and irregular heartbeat and possibility to perform a PICC exchange if needed/ordered by physician.  Alternatives to this procedure were also discussed.  Bard Power PICC patient education guide, fact sheet on infection prevention and patient information card has been provided to patient /or left at bedside.  Consent signed by sister.  PICC/Midline Placement Documentation  PICC Double Lumen 12/01/17 PICC Right Brachial 47 cm 0 cm (Active)  Indication for Insertion or Continuance of Line Prolonged intravenous therapies 12/01/2017  9:00 PM  Exposed Catheter (cm) 0 cm 12/01/2017  9:00 PM  Site Assessment Clean;Dry;Intact 12/01/2017  9:00 PM  Lumen #1 Status Flushed;Saline locked;Blood return noted 12/01/2017  9:00 PM  Lumen #2 Status Flushed;Saline locked;Blood return noted 12/01/2017  9:00 PM  Dressing Type Transparent 12/01/2017  9:00 PM  Dressing Status Clean;Dry;Intact;Antimicrobial disc in place 12/01/2017  9:00 PM  Dressing Change Due 12/08/17 12/01/2017  9:00 PM       Ethelda Chick 12/01/2017, 9:13 PM

## 2017-12-01 NOTE — Progress Notes (Signed)
PULMONARY / CRITICAL CARE MEDICINE   Name: Rylin Seavey MRN: [id number] DOB: [birth year]    ADMISSION DATE:  December 06, 2017 CONSULTATION DATE:  Dec 06, 2017  REFERRING MD: Dr. Wilkie Aye  CHIEF COMPLAINT:  SOB  HISTORY OF PRESENT ILLNESS:   HPI obtained from medical chart review as patient is currently intubated and sedated on mechanical ventilation.  62 year old male with past medical history significant for COPD, asthma, diabetes, CAD status post STEMI/ stent 06/2017, hypertension, and CVA who presented via EMS with complaints of shortness of breath and altered mental status. His significant other reports she has not done well since he MI 3 months ago.  Also reports he has not taken his medications for the last 5 days.  He has not been out of bed for the last 5 days.  Found him after he fell out of bed called 911 and was transported to [facility]  On arrival to the emergency room he was unable to protect his airway and was intubated.  He is to have a CT of his head to rule out CVA and/or trauma since he has had a history of CVA in the past.  On my arrival in the emergency room to trauma a he was sedated with propofol and going to CT scan for evaluation.  Neuro exam was difficult due to high level of sedation.  Difficult due to high level sedation  Note he had profound hypoglycemia otherwise not on insulin but he is on metformin.  He had to be started on dextrose 10% drip to maintain his glucose levels greater than 90.  Labs noted for BUN 53, sCr 3.18 (baseline 1.5-1.6), lactic acid 11.99 -> 9.52, BNP 1886, AST 1073, ALT 804, t. Bili 5.4, WBC 14.4, Plts 107, TSH 0.888, initial CXR showing pulmonary edema   We will admit to the intensive care unit continue to explore laboratory data evaluate CT scan of the head.  Treat underlying lactic acidosis, evaluate for infectious process and cardiology is to evaluate.  SUBJECTIVE / Interval Events:  No acute events overnight. Patient continue to be  sedated, febrile overnight with developing rash on torso and upper extremities.  VITAL SIGNS: BP 102/72   Pulse 77   Temp 99 F (37.2 C) (Oral)   Resp 16   Ht 6\' 3"  (1.905 m)   Wt 289 lb 7.4 oz (131.3 kg)   SpO2 98%   BMI 36.18 kg/m   HEMODYNAMICS:    VENTILATOR SETTINGS: Vent Mode: PRVC FiO2 (%):  [30 %] 30 % Set Rate:  [16 bmp] 16 bmp Vt Set:  [680 mL] 680 mL PEEP:  [5 cmH20] 5 cmH20 Pressure Support:  [12 cmH20] 12 cmH20 Plateau Pressure:  [21 cmH20-22 cmH20] 22 cmH20  INTAKE / OUTPUT: I/O last 3 completed shifts: In: 4358.6 [I.V.:1558.6; NG/GT:2700; IV Piggyback:100] Out: 3785 [Urine:3785]  PHYSICAL EXAMINATION: General: Obese man, ill-appearing, sedated, intubated Neuro:.  Less responsive than over the last few days, tries to open eyes and turns head to voice HEENT: Right IJ CVC in place ET tube in place Cardiovascular: Distant, regular, holosystolic murmur 2/6 Lungs: coarse bilaterally, decreased at both bases musculoskeletal: No deformity Skin: Trace lower extremity edema, improving  LABS:  BMET Recent Labs  Lab 11/30/17 0331 11/30/17 1803 12/01/17 0400  NA 149* 149* 148*  K 4.1 3.8 4.0  CL 108 109 108  CO2 27 27 27   BUN 55* 61* 61*  CREATININE 1.45* 1.36* 1.38*  GLUCOSE 132* 154* 159*    Electrolytes Recent Labs  Lab 11/29/17 0344 11/30/17 0331 11/30/17 1803 12/01/17 0400  CALCIUM 8.7* 8.7* 8.6* 8.8*  MG 1.6*  --   --   --   PHOS 2.4*  --   --   --     CBC Recent Labs  Lab 11/29/17 0344 11/30/17 0331 12/01/17 0400  WBC 11.9* 11.0* 11.4*  HGB 11.2* 11.4* 12.2*  HCT 35.2* 36.7* 38.2*  PLT 127* 138* 156    Coag's No results for input(s): APTT, INR in the last 168 hours.  Sepsis Markers Recent Labs  Lab 11/30/17 0331 12/01/17 0400  PROCALCITON 0.63 0.42    ABG No results for input(s): PHART, PCO2ART, PO2ART in the last 168 hours.  Liver Enzymes Recent Labs  Lab 11/27/17 0330 11/30/17 0331  AST 129* 59*  ALT 261*  116*  ALKPHOS 124 109  BILITOT 3.7* 2.2*  ALBUMIN 2.4* 2.4*    Cardiac Enzymes No results for input(s): TROPONINI, PROBNP in the last 168 hours.  Glucose Recent Labs  Lab 11/30/17 0401 11/30/17 0747 11/30/17 1201 11/30/17 1547 11/30/17 1929 11/30/17 2342  GLUCAP 106* 126* 123* 161* 153* 145*    Imaging No results found.   STUDIES:  11/29/17 CT of the head>>No acute intracranial abnormalities CXR 2/15 >>Progression of bibasilar airspace disease and small pleural effusion.   CULTURES: 2017-11-29 blood cultures x2>>NGTD 11/29/2017 sputum culture>>NGTD 29-Nov-2017 urine culture>>NGTD 11-29-2017 respiratory>> Candida albicans November 29, 2017 pro-calcitonin>>1.85 29-Nov-2017 respiratory virus panel>>Negative   ANTIBIOTICS: 2/13 Levaquin>>2/14 2/14 Ceftriaxone >>2/18 2/19 Ciprofloxacin gtt >>  SIGNIFICANT EVENTS: Nov 29, 2017 intubated for respiratory failure   LINES/TUBES: Nov 29, 2017 endotracheal tube>> 11-29-2017 right internal jugular CVL>>  DISCUSSION: 62 year old morbidly obese male who modality has been smoking recently states he is on NicoDerm patches.  He is noted to be and have been declining over the past 3 months following cardiac cath MI and stent placement.  He has not taken his medication for the last 5 days nor has he been out of bed for the last 5 days.  Significant other noted that he got up and fell during the night EMS was activated his transport to the emergency room he was unable to protect his airway and was intubated.  ASSESSMENT / PLAN:  PULMONARY A: Vent dependent respiratory failure secondary to inability to protect airway. CXR improving. Cardiogenic pulmonary edema Probable CAP left lower lobe  P:   Continue current ventilator support, work on pressure support  and SBT Follow up chest x-ray Continue diuresis, volume optimization   CARDIOVASCULAR A:  Coronary artery disease history with MI 3 (9/18) primary PCI to dominant left circumflex and PDA  with synergy drug-eluting stent.   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 Cooxemetry 87.9.  P:  Appreciate cardiology assistance  Hold bisoprolol, BP still low Continue Bidil, full tab Continue Captopril Continue furosemide 40 mg daily Continue Plavix, heparin drip Transition to Coumadin once acute issues stabilize   RENAL  A:   Acute renal failure with last known creatinine 1.24>>1.38. Stable. Presume cardiorenal syndrome. Continue to have good UOP. Hypernatremia-148. Free water deficit 4795 ml however given EF 10-15% will replace slowly.  P:   Lasix 40 mg daily  Continue free water 200cc q6 Follow urine output  Daily BMP   GASTROINTESTINAL A:   Transaminitis. Resolved. Hepatitis panel negative, RUQ [place] without any evidence of acute cholecystitis.  Suspect shock liver.   Morbid obesity GI protection Constipation- No BM in the past few days despite regimen P:   Continue PPI Continue tube  feeding as tolerated, note he had emesis 2/16 Smog Enema   HEMATOLOGIC A:  Large apical thrombus seen on Echo 2/14 with akinetic apex.  Platelets and Hemoglobin are stable. If patient tolerate extubation will consider starting Coumadin.  P:  Continue heparin as ordered Continue plavix Convert to Coumadin when stable from the acute episode   INFECTIOUS A:   Suspected left lower lobe CAP. Cultures and tracheal aspriates have been negative. Patient completed 7 days antibiotic course. Influenza and RVP negative. New rash noted on torso, abdomen, upper and lower extremities. Seems to be allergic reaction to chlorhexidine. Bilateral discharge from eyes concerning bacterial conjunctivitis   P:   Continue prednisone 20 mg bid for two days D/c chlorhexidine   Fever curve Continue cipro ophthalmic solution  ENDOCRINE CBG (last 3)  Recent Labs    11/30/17 1547 11/30/17 1929 11/30/17 2342  GLUCAP 161* 153* 145*    A:   T2DM, A1c 6.5.  Initial  hypoglycemia, resolved  P:   Enteral diabetic medications on hold SSI per protocol  NEUROLOGIC A:   Acute encephalopathy, toxic metabolic +possibly substances Cocaine abuse. Following commands  P:   Will discontinue propofol Wean sedation as able. RASS goal: -1,0    FAMILY  - Updates: Girlfriend at bedside daily    Lovena Neighbours, MD [facility], PGY-2 12/01/2017, 6:56 AM

## 2017-12-01 NOTE — Progress Notes (Signed)
Independently examined pt, evaluated data & formulated above care plan with NP/resident   62 year old obese man admitted 2/12 with 5 days of weakness and encephalopathy, hypoglycemia and transaminitis. with diabetes, COPD, coronary artery disease, history of CVA and hypertension, cocaine use. He had a left lower lobe consolidation thatwastreated as a community acquired pneumonia.He was found to have an EF of 10% with an LV thrombus, started on heparin.  On exam-sedated on low-dose Precedex, follows commands when aroused, minimal secretions, suggestive normal, 1+ bipedal edema. Maculopapular erythematous rash over neck arms thighs  Chest x-ray personally reviewed by me, improved bibasilar infiltrates. Labs show low pro-calcitonin, stable creatinine, mild hyponatremia.  Impression/plan Acute respiratory failure-continue weaning attempts, and if he does get down to pressure support 5/5 would like to obtain T-piece weaning prior to extubation.  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  Rash-seems to be due to chlorhexidine wipes, will avoid this and use prednisone for 2 more days  Prognosis guarded but hopeful of extubating him at some point  The patient is critically ill with multiple organ systems failure and requires high complexity decision making for assessment and support, frequent evaluation and titration of therapies, application of advanced monitoring technologies and extensive interpretation of multiple databases. Critical Care Time devoted to patient care services described in this note independent of APP/resident  time is 35 minutes.   Comer Locket Vassie Loll MD

## 2017-12-01 NOTE — Progress Notes (Signed)
Wasted 75 cc of Fentanyl in sink, as witnessed by Wynelle Fanny, Charity fundraiser.

## 2017-12-01 NOTE — Progress Notes (Signed)
Subjective:  Remains intubated  More awake and following commands today 02/22  1.9 L urine output/24 hrs.  Cr down to 1.38 today  Hypernatremia Na 148 Tachycardia improved after starting precedex.  Blood pressure improved   Objective:  Vital Signs in the last 24 hours: Temp:  [98.8 F (37.1 C)-100.6 F (38.1 C)] 100.6 F (38.1 C) (02/22 0734) Pulse Rate:  [62-198] 75 (02/22 0700) Resp:  [16-34] 16 (02/22 0700) BP: (82-110)/(57-93) 97/73 (02/22 0700) SpO2:  [91 %-99 %] 97 % (02/22 0700) FiO2 (%):  [30 %] 30 % (02/22 0350) Weight:  [131.3 kg (289 lb 7.4 oz)] 131.3 kg (289 lb 7.4 oz) (02/22 0404)  Intake/Output from previous day: 02/21 0701 - 02/22 0700 In: 2419.9 [I.V.:1019.9; NG/GT:1400] Out: 1985 [Urine:1985] Intake/Output from this shift: Total I/O In: 200 [NG/GT:200] Out: 125 [Urine:125]  Net -2.0 L  Physical Exam: Nursing note and vitals reviewed. Constitutional: He appears well-developed.  Morbidly obese. Intubated, sedated   HENT:  Head: Normocephalic.  Eyes: Bilateral eyes purulent discharge, improved.  Neck: Normal range of motion. Neck supple. JVD present.  Cardiovascular: Normal rate and regular rhythm.  Murmur (II/VI holosystolic murmur RLSB) heard. Distant heart sounds.   Respiratory: Effort normal.  B/l diminished breast sounds at bases  GI: Soft. Bowel sounds are normal. He exhibits distension.  Musculoskeletal: Normal range of motion. He exhibits edema (1+ b/l).  Lymphadenopathy:    He has no cervical adenopathy.  Neurological:  Awake. Following commands. Able to move all four extremities Skin: Skin is warm and dry.  Psychiatric:  Not assessed     Lab Results: Recent Labs    11/30/17 0331 12/01/17 0400  WBC 11.0* 11.4*  HGB 11.4* 12.2*  PLT 138* 156   Recent Labs    11/30/17 1803 12/01/17 0400  NA 149* 148*  K 3.8 4.0  CL 109 108  CO2 27 27  GLUCOSE 154* 159*  BUN 61* 61*  CREATININE 1.36* 1.38*   No results for  input(s): TROPONINI in the last 72 hours.  Invalid input(s): CK, MB Hepatic Function Panel Recent Labs    11/30/17 0331  PROT 6.0*  ALBUMIN 2.4*  AST 59*  ALT 116*  ALKPHOS 109  BILITOT 2.2*   Echocardiogram 11/23/2017 Study Conclusions   - Left ventricle: The cavity size was moderately dilated. Wall   thickness was increased in a pattern of mild LVH. Systolic   function was severely reduced. The estimated ejection fraction   was in the range of 10% to 15%. Diffuse hypokinesis. Akinesis of   the apical myocardium. The study is not technically sufficient to   allow evaluation of LV diastolic function. Acoustic contrast   opacification revealed a large, apicalthrombusassociated with an   akinetic segment. - Ventricular septum: The contour showed diastolic flattening and   systolic flattening. These changes are consistent with RV volume   and pressure overload. - Aortic valve: There was trivial regurgitation. - Mitral valve: There was mild to moderate regurgitation. - Left atrium: The atrium was severely dilated. - Right ventricle: The cavity size was severely dilated. Systolic   function was severely reduced. - Right atrium: The atrium was severely dilated. - Atrial septum: No defect or patent foramen ovale was identified. - Tricuspid valve: There was moderate regurgitation.  Assessment: Acute systolic heart failure, resolving cardiogenic shock Good urine output on low dose diuretics.  LV apical thrombus Acute Respiratory failure. Difficulty weaning. Sepsis, likely due to pneumonia B/l conjunctivitis?: On topical antibiotics Rash: ?Medication  induced. Being monitored AKI/CKD: Cr 1.38 today Lactic acid elevation, resolved Elevated liver enzymes: Likely shock liver. Now improving Mild troponin elevation: Type 2 MI CAD status post  primary PCI LCx PDA Synergy DES 2.5 X 16 mm 07/09/2017 H/o Hypertension Type 2 DM Hyperlipidemia Morbid obesity Tobacco  abuse Polysubstance abuse, including cocaine positive this admission H/o CVA   Recommendations:  Continue bidil 1/2 tab of 20-37.5 mg tid and captopril 6.25 mg tid for afterload reduction.  HR and BP lower with precedex, suggesting tachycardia could be due to anxiety.  I think he still needs more sustained RAAS inhibition before starting beta blocker. I would uptitrate bidil today to 1 tab 20-37/5 mg tid. Continue captopril. Eventually will switch to entresto.  Recommend IV lasix 40 mg daily. Agree with cautious use of free water   Weaning protocol per ICU team. Continue heparin, bridge to warfarin when possible. Continue plavix.  With his morbid obesity, history of medication noncompliance, h/o CVA, and most importantly substance abuse with urine positive for cocaine, he is not a candidate for any advanced heart failure therapy. I would not recommend escalation for his cardiac care beyond management with temporary inotropic support.   Prognosis remains guarded. I have discussed his current cardiac condition and prognosis with his partner Jeannine throughout this admission.  She is not his next of kin and not married to him.  His next of kin includes a son who lives in town, a son lives in [place], and a sister.     LOS: 10 days    Jakelyn Squyres J Alylah Blakney 12/01/2017, 8:15 AM  Tahmir Kleckner Emiliano Dyer, MD [facility]. [place] Pager: [phone] Office: [phone] If no answer Cell [phone]

## 2017-12-02 LAB — HEPARIN LEVEL (UNFRACTIONATED)
HEPARIN UNFRACTIONATED: 0.79 [IU]/mL — AB (ref 0.30–0.70)
HEPARIN UNFRACTIONATED: 0.81 [IU]/mL — AB (ref 0.30–0.70)
HEPARIN UNFRACTIONATED: 0.64 [IU]/mL (ref 0.30–0.70)

## 2017-12-02 LAB — BASIC METABOLIC PANEL
Anion gap: 11 (ref 5–15)
BUN: 66 mg/dL — ABNORMAL HIGH (ref 6–20)
CO2: 27 mmol/L (ref 22–32)
CREATININE: 1.45 mg/dL — AB (ref 0.61–1.24)
Calcium: 8.6 mg/dL — ABNORMAL LOW (ref 8.9–10.3)
Chloride: 109 mmol/L (ref 101–111)
GFR calc Af Amer: 59 mL/min — ABNORMAL LOW (ref >60)
GFR, EST NON AFRICAN AMERICAN: 51 mL/min — AB (ref >60)
GLUCOSE: 149 mg/dL — AB (ref 65–99)
POTASSIUM: 4 mmol/L (ref 3.5–5.1)
SODIUM: 147 mmol/L — AB (ref 135–145)

## 2017-12-02 LAB — GLUCOSE, CAPILLARY
GLUCOSE-CAPILLARY: 153 mg/dL — AB (ref 65–99)
GLUCOSE-CAPILLARY: 153 mg/dL — AB (ref 65–99)
GLUCOSE-CAPILLARY: 153 mg/dL — AB (ref 65–99)
Glucose-Capillary: 150 mg/dL — ABNORMAL HIGH (ref 65–99)
Glucose-Capillary: 158 mg/dL — ABNORMAL HIGH (ref 65–99)
Glucose-Capillary: 166 mg/dL — ABNORMAL HIGH (ref 65–99)

## 2017-12-02 LAB — CBC
HCT: 37.9 % — ABNORMAL LOW (ref 39.0–52.0)
HEMOGLOBIN: 12 g/dL — AB (ref 13.0–17.0)
MCH: 25.9 pg — AB (ref 26.0–34.0)
MCHC: 31.7 g/dL (ref 30.0–36.0)
MCV: 81.7 fL (ref 78.0–100.0)
Platelets: 205 10*3/uL (ref 150–400)
RBC: 4.64 MIL/uL (ref 4.22–5.81)
RDW: 22.6 % — AB (ref 11.5–15.5)
WBC: 10.6 10*3/uL — ABNORMAL HIGH (ref 4.0–10.5)

## 2017-12-02 LAB — PROCALCITONIN: PROCALCITONIN: 0.35 ng/mL

## 2017-12-02 MED ORDER — WARFARIN - PHARMACIST DOSING INPATIENT
Freq: Every day | Status: DC
  Administered 2017-12-02: 18:00:00

## 2017-12-02 MED ORDER — CARVEDILOL 3.125 MG PO TABS
3.1250 mg | ORAL_TABLET | Freq: Two times a day (BID) | ORAL | Status: DC
  Administered 2017-12-02: 3.125 mg via ORAL
  Filled 2017-12-02 (×4): qty 1

## 2017-12-02 MED ORDER — WHITE PETROLATUM EX OINT
TOPICAL_OINTMENT | CUTANEOUS | Status: AC
  Administered 2017-12-02: 11:00:00
  Filled 2017-12-02: qty 28.35

## 2017-12-02 MED ORDER — WARFARIN SODIUM 7.5 MG PO TABS
7.5000 mg | ORAL_TABLET | Freq: Once | ORAL | Status: AC
  Administered 2017-12-02: 7.5 mg via ORAL
  Filled 2017-12-02: qty 1

## 2017-12-02 NOTE — Progress Notes (Signed)
ANTICOAGULATION CONSULT NOTE   Pharmacy Consult for Heparin Indication: new apical thrombus  Allergies  Allergen Reactions  . Banana     Sick   . Chlorhexidine Gluconate     Caused contact dermatitis on skin  . Egg Yolk Nausea And Vomiting  . Kiwi Extract Nausea And Vomiting    Patient Measurements: Height: 6\' 3"  (190.5 cm) Weight: 296 lb 4.8 oz (134.4 kg) IBW/kg (Calculated) : 84.5 Heparin Dosing Weight: 116 kg  Vital Signs: Temp: 99.2 F (37.3 C) (02/23 2022) Temp Source: Oral (02/23 2022) BP: 97/70 (02/23 2103) Pulse Rate: 59 (02/23 2100)  Labs: Recent Labs    11/30/17 0331 11/30/17 1803 12/01/17 0400  12/02/17 0357 12/02/17 1139 12/02/17 2042  HGB 11.4*  --  12.2*  --  12.0*  --   --   HCT 36.7*  --  38.2*  --  37.9*  --   --   PLT 138*  --  156  --  205  --   --   HEPARINUNFRC 0.56  --   --    < > 0.79* 0.81* 0.64  CREATININE 1.45* 1.36* 1.38*  --  1.45*  --   --    < > = values in this interval not displayed.    Estimated Creatinine Clearance: 80.1 mL/min (A) (by C-G formula based on SCr of 1.45 mg/dL (H)).  Assessment: 62 y.o. male with CHF and apical thrombus. Pharmacy is dosing heparin.  -heparin level at goal after decrease to 1400 units/hr    Goal of Therapy:  Heparin level 0.3-0.7 units/ml Monitor platelets by anticoagulation protocol: Yes   Plan:  No heparin changes needed Daily heparin level and CBC   Harland German, Pharm D 12/02/2017 9:28 PM

## 2017-12-02 NOTE — Progress Notes (Signed)
Independently examined pt, evaluated data & formulated  care plan with NP/resident   62 year old obese man admitted 2/12 with 5 days of weakness and encephalopathy, hypoglycemia and transaminitis. PMH - diabetes, COPD, coronary artery disease, history of CVA and hypertension, cocaine use. He had a left lower lobe consolidation thatwastreated as a community acquired pneumonia.He was found to have an EF of 10% with an LV thrombus, started on heparin.  On exam-intermittent agitation on low-dose Precedex, minimal secretions, 1+ bipedal edema, maculopapular rash over neck, arms and thighs appears decreased.  Chest x-ray reviewed by me shows improved bibasilar infiltrates Labs show mild hyponatremia otherwise normal electrolytes, stable renal function, low pro-calcitonin.  Impression/plan  Acute respiratory failure-continue weaning attempts, would like to obtain T-piece weaning prior to extubation.  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  Rash-seems to be due to chlorhexidine wipes,  improved with prednisone  Prognosis remains guarded due to poor LV function but hopeful of extubating at some point  The patient is critically ill with multiple organ systems failure and requires high complexity decision making for assessment and support, frequent evaluation and titration of therapies, application of advanced monitoring technologies and extensive interpretation of multiple databases. Critical Care Time devoted to patient care services described in this note independent of APP/resident  time is 32 minutes.   Comer Locket Vassie Loll MD

## 2017-12-02 NOTE — Progress Notes (Addendum)
ANTICOAGULATION CONSULT NOTE   Pharmacy Consult for Heparin Indication: new apical thrombus  Allergies  Allergen Reactions  . Banana     Sick   . Chlorhexidine Gluconate     Caused contact dermatitis on skin  . Egg Yolk Nausea And Vomiting  . Kiwi Extract Nausea And Vomiting    Patient Measurements: Height: 6\' 3"  (190.5 cm) Weight: 296 lb 4.8 oz (134.4 kg) IBW/kg (Calculated) : 84.5 Heparin Dosing Weight: 116 kg  Vital Signs: Temp: 99.5 F (37.5 C) (02/23 1122) Temp Source: Oral (02/23 1122) BP: 86/72 (02/23 1140) Pulse Rate: 89 (02/23 1140)  Labs: Recent Labs    11/30/17 0331 11/30/17 1803 12/01/17 0400 12/01/17 0415 12/02/17 0357 12/02/17 1139  HGB 11.4*  --  12.2*  --  12.0*  --   HCT 36.7*  --  38.2*  --  37.9*  --   PLT 138*  --  156  --  205  --   HEPARINUNFRC 0.56  --   --  0.60 0.79* 0.81*  CREATININE 1.45* 1.36* 1.38*  --  1.45*  --     Estimated Creatinine Clearance: 80.1 mL/min (A) (by C-G formula based on SCr of 1.45 mg/dL (H)).  Assessment: 62 y.o. male with CHF and apical thrombus. Pharmacy is dosing heparin.   Heparin level remains supratherapeutic at 0.81 despite earlier rate decrease to 1600 units/hr. CBC is stable and no signs of bleeding have been noted.    Goal of Therapy:  Heparin level 0.3-0.7 units/ml Monitor platelets by anticoagulation protocol: Yes   Plan:  Decrease IV heparin to 1400 units/hr  F/u 6 hr HL Daily heparin level and CBC Monitor for signs/symptoms of bleeding F/U conversion to oral anticoagulation  Vinnie Level, PharmD., BCPS Clinical Pharmacist Pager [phone]  Addendum: Now starting warfarin. Will order warfarin 7.5 mg x 1 dose today. Drug interactions include plavix.   Vinnie Level, PharmD., BCPS Clinical Pharmacist Pager [phone]

## 2017-12-02 NOTE — Progress Notes (Signed)
PULMONARY / CRITICAL CARE MEDICINE   Name: Todd Sullivan MRN: [id number] DOB: 1956/03/17    ADMISSION DATE:  December 17, 2017 CONSULTATION DATE:  2017/12/17  REFERRING MD: Dr. Wilkie Aye  CHIEF COMPLAINT:  SOB  HISTORY OF PRESENT ILLNESS:   HPI obtained from medical chart review as patient is currently intubated and sedated on mechanical ventilation.  62 year old male with past medical history significant for COPD, asthma, diabetes, CAD status post STEMI/ stent 06/2017, hypertension, and CVA who presented via EMS with complaints of shortness of breath and altered mental status. His significant other reports she has not done well since he MI 3 months ago.  Also reports he has not taken his medications for the last 5 days.  He has not been out of bed for the last 5 days.  Found him after he fell out of bed called 911 and was transported to [facility].  On arrival to the emergency room he was unable to protect his airway and was intubated.  He is to have a CT of his head to rule out CVA and/or trauma since he has had a history of CVA in the past.  On my arrival in the emergency room to trauma a he was sedated with propofol and going to CT scan for evaluation.  Neuro exam was difficult due to high level of sedation.  Difficult due to high level sedation  Note he had profound hypoglycemia otherwise not on insulin but he is on metformin.  He had to be started on dextrose 10% drip to maintain his glucose levels greater than 90.  Labs noted for BUN 53, sCr 3.18 (baseline 1.5-1.6), lactic acid 11.99 -> 9.52, BNP 1886, AST 1073, ALT 804, t. Bili 5.4, WBC 14.4, Plts 107, TSH 0.888, initial CXR showing pulmonary edema   We will admit to the intensive care unit continue to explore laboratory data evaluate CT scan of the head.  Treat underlying lactic acidosis, evaluate for infectious process and cardiology is to evaluate.  SUBJECTIVE / Interval Events:  PICC placed overnight. Patient more awake and  alert. Agitated at times. Girlfriend has been at bedside.  VITAL SIGNS: BP 90/66   Pulse 73   Temp 99.9 F (37.7 C) (Oral)   Resp 17   Ht 6\' 3"  (1.905 m)   Wt 296 lb 4.8 oz (134.4 kg)   SpO2 97%   BMI 37.03 kg/m   HEMODYNAMICS:    VENTILATOR SETTINGS: Vent Mode: PRVC FiO2 (%):  [30 %] 30 % Set Rate:  [16 bmp] 16 bmp Vt Set:  [680 mL] 680 mL PEEP:  [5 cmH20] 5 cmH20 Plateau Pressure:  [19 cmH20-22 cmH20] 20 cmH20  INTAKE / OUTPUT: I/O last 3 completed shifts: In: 4057.2 [I.V.:1707.2; NG/GT:2350] Out: 3175 [Urine:2525; Emesis/NG output:650]  PHYSICAL EXAMINATION: General: Obese man, ill-appearing, sedated, intubated Neuro:.  Less responsive than over the last few days, tries to open eyes and turns head to voice HEENT: Right IJ CVC in place ET tube in place Cardiovascular: Distant, regular, holosystolic murmur 2/6 Lungs: coarse bilaterally, decreased at both bases musculoskeletal: No deformity Skin: Trace lower extremity edema, improving  LABS:  BMET Recent Labs  Lab 11/30/17 1803 12/01/17 0400 12/02/17 0357  NA 149* 148* 147*  K 3.8 4.0 4.0  CL 109 108 109  CO2 27 27 27   BUN 61* 61* 66*  CREATININE 1.36* 1.38* 1.45*  GLUCOSE 154* 159* 149*    Electrolytes Recent Labs  Lab 11/29/17 0344  11/30/17 1803 12/01/17 0400 12/02/17 0357  CALCIUM 8.7*   < > 8.6* 8.8* 8.6*  MG 1.6*  --   --   --   --   PHOS 2.4*  --   --   --   --    < > = values in this interval not displayed.    CBC Recent Labs  Lab 11/30/17 0331 12/01/17 0400 12/02/17 0357  WBC 11.0* 11.4* 10.6*  HGB 11.4* 12.2* 12.0*  HCT 36.7* 38.2* 37.9*  PLT 138* 156 205    Coag's No results for input(s): APTT, INR in the last 168 hours.  Sepsis Markers Recent Labs  Lab 11/30/17 0331 12/01/17 0400 12/02/17 0357  PROCALCITON 0.63 0.42 0.35    ABG No results for input(s): PHART, PCO2ART, PO2ART in the last 168 hours.  Liver Enzymes Recent Labs  Lab 11/27/17 0330 11/30/17 0331   AST 129* 59*  ALT 261* 116*  ALKPHOS 124 109  BILITOT 3.7* 2.2*  ALBUMIN 2.4* 2.4*    Cardiac Enzymes No results for input(s): TROPONINI, PROBNP in the last 168 hours.  Glucose Recent Labs  Lab 12/01/17 1132 12/01/17 1528 12/01/17 1941 12/01/17 2333 12/02/17 0324 12/02/17 0726  GLUCAP 142* 146* 161* 175* 153* 166*    Imaging [place] Ekg Site Rite  Result Date: 12/01/2017 If Site Rite image not attached, placement could not be confirmed due to current cardiac rhythm.    STUDIES:  2017/12/20 CT of the head>>No acute intracranial abnormalities CXR 2/15 >>Progression of bibasilar airspace disease and small pleural effusion.   CULTURES: 2017/12/20 blood cultures x2>>NGTD 12-20-17 sputum culture>>NGTD 12-20-2017 urine culture>>NGTD 12/20/2017 respiratory>> Candida albicans 12/20/2017 pro-calcitonin>>1.85 2017-12-20 respiratory virus panel>>Negative   ANTIBIOTICS: 2/13 Levaquin>>2/14 2/14 Ceftriaxone >>2/18 2/19 Ciprofloxacin gtt >>  SIGNIFICANT EVENTS: December 20, 2017 intubated for respiratory failure   LINES/TUBES: December 20, 2017 endotracheal tube>> 12-20-17 right internal jugular CVL>>  DISCUSSION: 62 year old morbidly obese male who modality has been smoking recently states he is on NicoDerm patches.  He is noted to be and have been declining over the past 3 months following cardiac cath MI and stent placement.  He has not taken his medication for the last 5 days nor has he been out of bed for the last 5 days.  Significant other noted that he got up and fell during the night EMS was activated his transport to the emergency room he was unable to protect his airway and was intubated.  ASSESSMENT / PLAN:  PULMONARY A: Vent dependent respiratory failure secondary to inability to protect airway. CXR improving. Cardiogenic pulmonary edema-improving Probable CAP left lower lobe-completed ceftriaxone therapy  P:   Continue current ventilator support, work on pressure support and  SBT will attempt extubation after T piece trial. Follow up chest x-ray Continue diuresis, volume optimization   CARDIOVASCULAR A:  Coronary artery disease history with MI 3 (9/18) primary PCI to dominant left circumflex and PDA with synergy drug-eluting stent.   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  CHF-EF 10-15%, diffuse akinesis and akinesis of the apical myocardium.  P:  Appreciate cardiology assistance  Hold bisoprolol, BP and HR still low Continue Bidil, full tab Continue Captopril 6.25 mg  Continue furosemide 40 mg daily Continue Plavix, heparin drip Transition to Coumadin once acute issues stabilize   RENAL A:   Acute renal failure with last known creatinine 1.38>>1.45. Stable. Presume cardiorenal syndrome. Continue to have good UOP. Hypernatremia-147. Free water deficit 4795 ml however given EF 10-15% will replace slowly.  P:   Lasix 40 mg daily  Continue free water 200cc q6 Follow urine output  Daily BMP   GASTROINTESTINAL A:   Transaminitis. Resolved. Hepatitis panel negative, RUQ [place] without any evidence of acute cholecystitis.  Suspect shock liver.   Morbid obesity GI protection Constipation- Large BM 2/22  P:   Continue PPI Continue tube feeding as tolerated Bowel regimen   HEMATOLOGIC A:  Large apical thrombus seen on Echo 2/14 with akinetic apex.  Platelets and Hemoglobin are stable. If patient tolerate extubation will consider starting Coumadin.  P:  Continue heparin as ordered Continue plavix Convert to Coumadin when stable from the acute episode   INFECTIOUS A:   -Suspected left lower lobe CAP. Cultures and tracheal aspriates have been negative. Patient completed 7 days antibiotic course. Influenza and RVP negative. -New rash noted on torso, abdomen, upper and lower extremities-improving with steroids. Likely to be allergic reaction to chlorhexidine. -Bilateral discharge from eyes concerning bacterial  conjunctivitis -Oral abscess likely cause of intermittent fever noted past few days   P:   Continue prednisone 20 mg bid END 2/24 Fever curve Continue cipro ophthalmic solution  ENDOCRINE CBG (last 3)  Recent Labs    12/01/17 2333 12/02/17 0324 12/02/17 0726  GLUCAP 175* 153* 166*    A:   T2DM, A1c 6.5.  Initial hypoglycemia, resolved  P:   Enteral diabetic medications on hold SSI per protocol  NEUROLOGIC A:   Acute encephalopathy, toxic metabolic +possibly substances Cocaine abuse. Following commands still mildly agitated.  P:   Will discontinue propofol Currently on Precedx Wean sedation as able. RASS goal: -1,0   FAMILY  - Updates: Girlfriend at bedside daily    Lovena Neighbours, MD [facility], PGY-2 12/02/2017, 8:25 AM

## 2017-12-02 NOTE — Progress Notes (Signed)
Subjective:  Sedated and on ventilator. Responds by trying to look and move head when his name is called  Objective:  Vital Signs in the last 24 hours: Temp:  [99.5 F (37.5 C)-100.1 F (37.8 C)] 99.5 F (37.5 C) (02/23 1122) Pulse Rate:  [64-95] 82 (02/23 1500) Resp:  [13-36] 31 (02/23 1500) BP: (86-134)/(54-91) 116/77 (02/23 1500) SpO2:  [93 %-99 %] 95 % (02/23 1500) FiO2 (%):  [30 %] 30 % (02/23 1140) Weight:  [134.4 kg (296 lb 4.8 oz)] 134.4 kg (296 lb 4.8 oz) (02/23 0404)  Intake/Output from previous day: 02/22 0701 - 02/23 0700 In: 3059.4 [I.V.:1209.4; NG/GT:1850] Out: 2075 [Urine:1425; Emesis/NG output:650]  Physical Exam: Blood pressure 116/77, pulse 82, temperature 99.5 F (37.5 C), temperature source Oral, resp. rate (!) 31, height _0  (1.905 m), weight 134.4 kg (296 lb 4.8 oz), SpO2 95 %.  General appearance: Sedated and intubated Eyes: negative findings: lids and lashes normal Neck: no adenopathy, no carotid bruit, no JVD, supple, symmetrical, trachea midline and thyroid not enlarged, symmetric, no tenderness/mass/nodules Neck: JVP - normal, carotids 2+= without bruits Resp: clear to auscultation bilaterally Chest wall: No trauma Cardio: regular rate and rhythm, S1, S2 normal, no murmur, click, rub or gallop GI: Soft and BS heard in all 4 quadrants Extremities: extremities normal, atraumatic, no cyanosis or edema    Lab Results: BMP Recent Labs    11/30/17 1803 12/01/17 0400 12/02/17 0357  NA 149* 148* 147*  K 3.8 4.0 4.0  CL 109 108 109  CO2 _1 GLUCOSE 154* 159* 149*  BUN 61* 61* 66*  CREATININE 1.36* 1.38* 1.45*  CALCIUM 8.6* 8.8* 8.6*  GFRNONAA 55* 54* 51*  GFRAA >60 >60 59*    CBC Recent Labs  Lab 12/02/17 0357  WBC 10.6*  RBC 4.64  HGB 12.0*  HCT 37.9*  PLT 205  MCV 81.7  MCH 25.9*  MCHC 31.7  RDW 22.6*    HEMOGLOBIN A1C Lab Results  Component Value Date   HGBA1C 6.5 (H) 11/25/2017   MPG 139.85 12/04/2017     Cardiac Panel (last 3 results) Recent Labs    11/27/2017 0439 12/07/2017 0823 11/29/2017 1445 11/22/17 0451 11/23/17 0524  CKTOTAL 1,886* 1,926*  --   --  381  CKMB  --  34.5*  --   --   --   TROPONINI  --  0.05* 0.06* 0.07*  --   RELINDX  --  1.8  --   --   --     BNP (last 3 results) No results for input(s): PROBNP in the last 8760 hours.  TSH Recent Labs    11/23/2017 0526  TSH 0.888    Lipid Panel     Component Value Date/Time   CHOL 124 07/07/2017 0032   TRIG 80 11/27/2017 0330   HDL 36 (L) 07/07/2017 0032   CHOLHDL 3.4 07/07/2017 0032   VLDL 12 07/07/2017 0032   LDLCALC 76 07/07/2017 0032     Hepatic Function Panel Recent Labs    11/22/17 0430  11/24/17 0446 11/27/17 0330 11/30/17 0331  PROT 5.9*   < > 5.3* 5.9* 6.0*  ALBUMIN 2.9*   < > 2.4* 2.4* 2.4*  AST 1,769*   < > 525* 129* 59*  ALT 1,173*   < > 646* 261* 116*  ALKPHOS 167*   < > 148* 124 109  BILITOT 5.3*   < > 4.5* 3.7* 2.2*  BILIDIR 3.0*  --   --   --   --  IBILI 2.3*  --   --   --   --    < > = values in this interval not displayed.    Imaging: Imaging results have been reviewed  Scheduled Meds: . captopril  6.25 mg Oral TID  . chlorhexidine gluconate (MEDLINE KIT)  15 mL Mouth Rinse BID  . Chlorhexidine Gluconate Cloth  6 each Topical Daily  . ciprofloxacin  2 drop Both Eyes QID  . clopidogrel  75 mg Per Tube Daily  . docusate  100 mg Per Tube Daily  . feeding supplement (PRO-STAT SUGAR FREE 64)  30 mL Per Tube 5 X Daily  . free water  200 mL Per Tube Q6H  . furosemide  40 mg Intravenous Daily  . insulin aspart  0-9 Units Subcutaneous Q4H  . isosorbide-hydrALAZINE  1 tablet Per Tube TID  . mouth rinse  15 mL Mouth Rinse 10 times per day  . pantoprazole sodium  40 mg Per Tube QHS  . predniSONE  20 mg Oral BID WC  . sennosides  5 mL Per Tube Daily  . sodium chloride flush  10-40 mL Intracatheter Q12H  . sodium chloride flush  10-40 mL Intracatheter Q12H   Continuous  Infusions: . sodium chloride 250 mL (12/02/17 0700)  . dexmedetomidine (PRECEDEX) IV infusion 0.5 mcg/kg/hr (12/02/17 1425)  . feeding supplement (VITAL HIGH PROTEIN) 1,000 mL (12/02/17 1144)  . heparin 1,400 Units/hr (12/02/17 1344)   PRN Meds:.sodium chloride, fentaNYL (SUBLIMAZE) injection, ipratropium-albuterol, sodium chloride flush, sodium chloride flush  Cardiac Studies: EKG 11/22/2017: Normal sinus rhythm with rate of 97 bpm, biatrial enlargement, cannot exclude inferior infarct old.  No ischemia.  Echocardiogram 11/23/2017: Moderately dilated LV, mild LVH, severely reduced LVEF of 10-15%.  Large apical thrombus associated with akinetic segment.  Ventricular septum shows  flattening consistent with RV volume and pressure overload.  Mild to moderate MR, severe biatrial enlargement, moderate tricuspid regurgitation.  PA pressure 37 to 52 mmHg depending on RA pressure of 3-15 mmHg as patient is intubated.  Assessment/Plan:  1.  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  No significant disease in the other vessels.  Serum troponin not elevated significantly, hence do not suspect non-STEMI. 2.  Mural thrombus, presently on IV heparin.  Mild hematuria noted. 3.  Acute systolic heart failure 4.  Morbid obesity  Recommendation: Patient's blood pressure is stable, we could start him on Coreg 3.25 mg twice daily.  We could transition him to Coumadin.  Would continue at this low dose of captopril and Coreg for now until he is hemodynamically stable and extubated and then we can titrate cardiac medications as tolerated.  Please call if questions for now we will sign off.  Adrian Prows, M.D. 12/02/2017, 3:20 PM [facility], [place] Pager: [phone] Office: [phone] If no answer: [phone]

## 2017-12-02 NOTE — Progress Notes (Signed)
ANTICOAGULATION CONSULT NOTE - Follow Up Consult  Pharmacy Consult for heparin Indication: apical thrombus  Labs: Recent Labs    11/30/17 0331 11/30/17 1803 12/01/17 0400 12/01/17 0415 12/02/17 0357  HGB 11.4*  --  12.2*  --  12.0*  HCT 36.7*  --  38.2*  --  37.9*  PLT 138*  --  156  --  205  HEPARINUNFRC 0.56  --   --  0.60 0.79*  CREATININE 1.45* 1.36* 1.38*  --  1.45*    Assessment: 62yo male now above goal on heparin after several levels at goal though had been steadily trending up; RN notes no gtt issues or signs of bleeding.  Goal of Therapy:  Heparin level 0.3-0.7 units/ml   Plan:  Will decrease heparin gtt by 1 unit/kg/hr to 1600 units/hr and check level in 6 hours.    Vernard Gambles, PharmD, BCPS  12/02/2017,5:38 AM

## 2017-12-03 ENCOUNTER — Inpatient Hospital Stay (HOSPITAL_COMMUNITY): Payer: Medicare Other

## 2017-12-03 LAB — BASIC METABOLIC PANEL
Anion gap: 11 (ref 5–15)
BUN: 71 mg/dL — ABNORMAL HIGH (ref 6–20)
CHLORIDE: 111 mmol/L (ref 101–111)
CO2: 27 mmol/L (ref 22–32)
CREATININE: 1.29 mg/dL — AB (ref 0.61–1.24)
Calcium: 8.6 mg/dL — ABNORMAL LOW (ref 8.9–10.3)
GFR calc Af Amer: >60 mL/min (ref >60)
GFR calc non Af Amer: 59 mL/min — ABNORMAL LOW (ref >60)
GLUCOSE: 154 mg/dL — AB (ref 65–99)
Potassium: 3.7 mmol/L (ref 3.5–5.1)
Sodium: 149 mmol/L — ABNORMAL HIGH (ref 135–145)

## 2017-12-03 LAB — CBC
HCT: 37.7 % — ABNORMAL LOW (ref 39.0–52.0)
HEMOGLOBIN: 11.9 g/dL — AB (ref 13.0–17.0)
MCH: 26.2 pg (ref 26.0–34.0)
MCHC: 31.6 g/dL (ref 30.0–36.0)
MCV: 82.9 fL (ref 78.0–100.0)
Platelets: 206 10*3/uL (ref 150–400)
RBC: 4.55 MIL/uL (ref 4.22–5.81)
RDW: 23.3 % — ABNORMAL HIGH (ref 11.5–15.5)
WBC: 10.7 10*3/uL — ABNORMAL HIGH (ref 4.0–10.5)

## 2017-12-03 LAB — GLUCOSE, CAPILLARY
Glucose-Capillary: 110 mg/dL — ABNORMAL HIGH (ref 65–99)
Glucose-Capillary: 130 mg/dL — ABNORMAL HIGH (ref 65–99)
Glucose-Capillary: 134 mg/dL — ABNORMAL HIGH (ref 65–99)
Glucose-Capillary: 135 mg/dL — ABNORMAL HIGH (ref 65–99)
Glucose-Capillary: 137 mg/dL — ABNORMAL HIGH (ref 65–99)
Glucose-Capillary: 152 mg/dL — ABNORMAL HIGH (ref 65–99)
Glucose-Capillary: 155 mg/dL — ABNORMAL HIGH (ref 65–99)

## 2017-12-03 LAB — HEPARIN LEVEL (UNFRACTIONATED): Heparin Unfractionated: 0.37 IU/mL (ref 0.30–0.70)

## 2017-12-03 NOTE — Progress Notes (Signed)
RT attempted to wean pt using a T-bar per MD request.  Pt failed wean on T-bar due to desat and increased agitation.  Pt placed on PSV/CPAP 12/5 to wean. Pt tolerating at this time.

## 2017-12-03 NOTE — Progress Notes (Signed)
ANTICOAGULATION CONSULT NOTE   Pharmacy Consult for Heparin Indication: new apical thrombus  Allergies  Allergen Reactions  . Banana     Sick   . Chlorhexidine Gluconate     Caused contact dermatitis on skin  . Egg Yolk Nausea And Vomiting  . Kiwi Extract Nausea And Vomiting    Patient Measurements: Height: 6\' 3"  (190.5 cm) Weight: 289 lb 11 oz (131.4 kg) IBW/kg (Calculated) : 84.5 Heparin Dosing Weight: 116 kg  Vital Signs: Temp: 98.6 F (37 C) (02/24 0739) Temp Source: Oral (02/24 0739) BP: 92/70 (02/24 0807) Pulse Rate: 63 (02/24 0807)  Labs: Recent Labs    12/01/17 0400  12/02/17 0357 12/02/17 1139 12/02/17 2042 12/03/17 0417  HGB 12.2*  --  12.0*  --   --  11.9*  HCT 38.2*  --  37.9*  --   --  37.7*  PLT 156  --  205  --   --  206  HEPARINUNFRC  --    < > 0.79* 0.81* 0.64 0.37  CREATININE 1.38*  --  1.45*  --   --  1.29*   < > = values in this interval not displayed.    Estimated Creatinine Clearance: 89 mL/min (A) (by C-G formula based on SCr of 1.29 mg/dL (H)).  Assessment: 62 y.o. male with CHF and apical thrombus. Pharmacy is dosing heparin.   Heparin level therapeutic at 0.37 on 1400 units/hr of IV heparin. CBC is stable and no signs of bleeding have been noted. Patient received 1 dose of warfarin yesterday but now d/c'ed due to plans for a tracheostomy soon.   Goal of Therapy:  Heparin level 0.3-0.7 units/ml Monitor platelets by anticoagulation protocol: Yes   Plan:  Continue IV heparin at 1400 units/hr  Daily heparin level and CBC Monitor for signs/symptoms of bleeding F/u resuming warfarin after trach.   Vinnie Level, PharmD., BCPS Clinical Pharmacist Pager [phone]

## 2017-12-03 NOTE — Progress Notes (Signed)
Pt placed back on full vent support due to increased RR, WOB, and agitation.  RN at bedside. MD notified.

## 2017-12-03 NOTE — Progress Notes (Signed)
Independently examined pt, evaluated data & formulated above care plan with NP/resident   62 year old obese man admitted2/12with 5 days of weakness and encephalopathy, hypoglycemia and transaminitis. He had a left lower lobe consolidation thatwastreated as a community acquired pneumonia.He had an EF of 10% with an LV thrombus,started on heparin. PMH - diabetes, COPD, coronary artery disease, history of CVA and hypertension, cocaine use.   On exam-awake and follows commands on low-dose Precedex, minimal secretions, maculopapular rash has decreased over neck, arms and thighs, 1+ bipedal edema.  Chest x-ray personally reviewed by me shows improved bibasilar infiltrates.  Labs show increasing hyponatremia, decreasing creatinine, normal blood counts, heparin levels in range.  Impression/plan Acute respiratory failure-tolerated pressure support 10/5 but does not tolerate T-piece weaning, likely needs further optimization of cardiac function before extubating. 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-hydralazine and nitrates and captopril being titrated, Coreg added,not a candidate for advanced heart failure therapies Continue IV heparin for LV thrombus, hold off on Coumadin until cleared by tracheostomy not required  Rash has improved, likely due to chlorhexidine wipes rather than antibiotics  Prognosis remains guarded due to poor LV function but still remain hopeful that we will extubate at some point  The patient is critically ill with multiple organ systems failure and requires high complexity decision making for assessment and support, frequent evaluation and titration of therapies, application of advanced monitoring technologies and extensive interpretation of multiple databases. Critical Care Time devoted to patient care services described in this note independent of APP/resident  time is 32 minutes.    Comer Locket Vassie Loll MD

## 2017-12-03 NOTE — Progress Notes (Addendum)
PULMONARY / CRITICAL CARE MEDICINE   Name: Todd Sullivan MRN: [id number] DOB: [birth year]    ADMISSION DATE:  11/26/2017 CONSULTATION DATE:  11/22/2017  REFERRING MD: Dr. Wilkie Aye  CHIEF COMPLAINT:  SOB  HISTORY OF PRESENT ILLNESS:   HPI obtained from medical chart review as patient is currently intubated and sedated on mechanical ventilation.  62 year old male with past medical history significant for COPD, asthma, diabetes, CAD status post STEMI/ stent 06/2017, hypertension, and CVA who presented via EMS with complaints of shortness of breath and altered mental status. His significant other reports she has not done well since he MI 3 months ago.  Also reports he has not taken his medications for the last 5 days.  He has not been out of bed for the last 5 days.  Found him after he fell out of bed called 911 and was transported to [facility]  On arrival to the emergency room he was unable to protect his airway and was intubated.  He is to have a CT of his head to rule out CVA and/or trauma since he has had a history of CVA in the past.  On my arrival in the emergency room to trauma a he was sedated with propofol and going to CT scan for evaluation.  Neuro exam was difficult due to high level of sedation.  Difficult due to high level sedation  Note he had profound hypoglycemia otherwise not on insulin but he is on metformin.  He had to be started on dextrose 10% drip to maintain his glucose levels greater than 90.  Labs noted for BUN 53, sCr 3.18 (baseline 1.5-1.6), lactic acid 11.99 -> 9.52, BNP 1886, AST 1073, ALT 804, t. Bili 5.4, WBC 14.4, Plts 107, TSH 0.888, initial CXR showing pulmonary edema   We will admit to the intensive care unit continue to explore laboratory data evaluate CT scan of the head.  Treat underlying lactic acidosis, evaluate for infectious process and cardiology is to evaluate.  SUBJECTIVE / Interval Events:  No acute events overnight.  VITAL SIGNS: BP  92/71   Pulse 64   Temp 98.6 F (37 C) (Oral)   Resp 16   Ht 6\' 3"  (1.905 m)   Wt 289 lb 11 oz (131.4 kg)   SpO2 94%   BMI 36.21 kg/m   HEMODYNAMICS:    VENTILATOR SETTINGS: Vent Mode: PRVC FiO2 (%):  [30 %] 30 % Set Rate:  [16 bmp] 16 bmp Vt Set:  [680 mL] 680 mL PEEP:  [5 cmH20] 5 cmH20 Pressure Support:  [8 cmH20-14 cmH20] 8 cmH20 Plateau Pressure:  [20 cmH20-31 cmH20] 22 cmH20  INTAKE / OUTPUT: I/O last 3 completed shifts: In: 4176.9 [I.V.:1706.9; NG/GT:2470] Out: 3800 [Urine:3400; Emesis/NG output:400]  PHYSICAL EXAMINATION: General: Obese man, ill-appearing, sedated, intubated Neuro:.  Less responsive than over the last few days, tries to open eyes and turns head to voice HEENT: Right IJ CVC in place ET tube in place Cardiovascular: Distant, regular, holosystolic murmur 2/6 Lungs: coarse bilaterally, decreased at both bases musculoskeletal: No deformity Skin: Trace lower extremity edema, improving  LABS:  BMET Recent Labs  Lab 12/01/17 0400 12/02/17 0357 12/03/17 0417  NA 148* 147* 149*  K 4.0 4.0 3.7  CL 108 109 111  CO2 27 27 27   BUN 61* 66* 71*  CREATININE 1.38* 1.45* 1.29*  GLUCOSE 159* 149* 154*    Electrolytes Recent Labs  Lab 11/29/17 0344  12/01/17 0400 12/02/17 0357 12/03/17 0417  CALCIUM 8.7*   < >  8.8* 8.6* 8.6*  MG 1.6*  --   --   --   --   PHOS 2.4*  --   --   --   --    < > = values in this interval not displayed.    CBC Recent Labs  Lab 12/01/17 0400 12/02/17 0357 12/03/17 0417  WBC 11.4* 10.6* 10.7*  HGB 12.2* 12.0* 11.9*  HCT 38.2* 37.9* 37.7*  PLT 156 205 206    Coag's No results for input(s): APTT, INR in the last 168 hours.  Sepsis Markers Recent Labs  Lab 11/30/17 0331 12/01/17 0400 12/02/17 0357  PROCALCITON 0.63 0.42 0.35    ABG No results for input(s): PHART, PCO2ART, PO2ART in the last 168 hours.  Liver Enzymes Recent Labs  Lab 11/27/17 0330 11/30/17 0331  AST 129* 59*  ALT 261* 116*   ALKPHOS 124 109  BILITOT 3.7* 2.2*  ALBUMIN 2.4* 2.4*    Cardiac Enzymes No results for input(s): TROPONINI, PROBNP in the last 168 hours.  Glucose Recent Labs  Lab 12/02/17 0726 12/02/17 1124 12/02/17 1557 12/02/17 2017 12/02/17 2359 12/03/17 0418  GLUCAP 166* 153* 150* 153* 158* 135*    Imaging Dg Chest Port 1 View  Result Date: 12/03/2017 CLINICAL DATA:  Pneumonia. EXAM: PORTABLE CHEST 1 VIEW COMPARISON:  December 01, 2017 FINDINGS: The feeding tube terminates below today's film, not well assessed. The ETT and right central line are in good position. A right PICC line terminates in the SVC is well. Stable cardiomegaly. The hila and mediastinum are unchanged. No pneumothorax. Small left effusion and underlying opacity. Mild pulmonary venous congestion. IMPRESSION: 1. Support apparatus as above. 2. Cardiomegaly and mild pulmonary venous congestion. 3. Probable small effusion and associated atelectasis. Electronically Signed   By: Gerome Sam III M.D   On: 12/03/2017 07:02     STUDIES:  11/26/2017 CT of the head>>No acute intracranial abnormalities CXR 2/15 >>Progression of bibasilar airspace disease and small pleural effusion.   CULTURES: 11/22/2017 blood cultures x2>>NGTD 11/26/2017 sputum culture>>NGTD 12/05/2017 urine culture>>NGTD 11/13/2017 respiratory>> Candida albicans 12/03/2017 pro-calcitonin>>1.85 11/16/2017 respiratory virus panel>>Negative   ANTIBIOTICS: 2/13 Levaquin>>2/14 2/14 Ceftriaxone >>2/18 2/19 Ciprofloxacin gtt >>  SIGNIFICANT EVENTS: 12/01/2017 intubated for respiratory failure   LINES/TUBES: 11/30/2017 endotracheal tube>> 12/02/2017 right internal jugular CVL>> 12/01/2017 PICC>>  DISCUSSION: 62 year old morbidly obese male who modality has been smoking recently states he is on NicoDerm patches.  He is noted to be and have been declining over the past 3 months following cardiac cath MI and stent placement.  He has not taken his medication for the  last 5 days nor has he been out of bed for the last 5 days.  Significant other noted that he got up and fell during the night EMS was activated his transport to the emergency room he was unable to protect his airway and was intubated.  ASSESSMENT / PLAN:  PULMONARY A: Vent dependent respiratory failure secondary to inability to protect airway. CXR improving. Cardiogenic pulmonary edema-improving Probable CAP left lower lobe-completed ceftriaxone therapy  P:   Continue current ventilator support, work on pressure support and SBT will attempt extubation after T piece trial. Follow up chest x-ray Continue diuresis, volume optimization   CARDIOVASCULAR A:  Coronary artery disease history with MI 3 (9/18) primary PCI to dominant left circumflex and PDA with synergy drug-eluting stent.   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  CHF-EF 10-15%, diffuse hypokinesis and akinesis of the apical myocardium. Decrease HR  and low BP this am in the setting on BB initiation yesterday.   P:  Cardiology is signing off, but available as needed Hold  Coreg 3.25 mg this am Continue Bidil, full tab Continue Captopril 6.25 mg  Continue furosemide 40 mg daily Continue Plavix, heparin drip Started Coumadin transition (2/23)   RENAL A:   Acute renal failure with last known creatinine 1.45>>1.29. Stable. Presume cardiorenal syndrome. Continue to have good UOP. Hypernatremia-149.   P:   Lasix 40 mg daily  Continue free water 200cc q6 Follow urine output  Daily BMP   GASTROINTESTINAL A:   Transaminitis. Resolved. Hepatitis panel negative, RUQ [place] without any evidence of acute cholecystitis.  Suspect shock liver.   Morbid obesity GI protection Constipation- Large BM 2/22  P:   Continue PPI Continue tube feeding as tolerated Bowel regimen   HEMATOLOGIC A:  Large apical thrombus seen on Echo 2/14 with akinetic apex.  Platelets and Hemoglobin are stable. If  patient tolerate extubation will consider starting Coumadin.  P:  Continue Heparin as ordered Continue Plavix Stop coumadin transition, unsure if patient will need trach.   INFECTIOUS A:   -Suspected left lower lobe CAP. Cultures and tracheal aspriates have been negative. Patient completed 7 days antibiotic course. Influenza and RVP negative. -New rash noted on torso, abdomen, upper and lower extremities-improving with steroids. Likely to be allergic reaction to chlorhexidine pads. -Bilateral discharge from eyes concerning bacterial conjunctivitis-improved -Oral abscess likely cause of intermittent fever noted past few days   P:   Prednisone 20 mg bid completed 2/23 Fever curve Continue cipro ophthalmic solution  ENDOCRINE CBG (last 3)  Recent Labs    12/02/17 2017 12/02/17 2359 12/03/17 0418  GLUCAP 153* 158* 135*    A:   T2DM, A1c 6.5.  Initial hypoglycemia, resolved  P:   Enteral diabetic medications on hold SSI per protocol  NEUROLOGIC A:   Acute encephalopathy, toxic metabolic +possibly substances Cocaine abuse. Following commands still mildly agitated.   P:   Will discontinue propofol Currently on Precedex, wean a tolerated  Wean sedation as able. RASS goal: -1,0    FAMILY  - Updates: Girlfriend at bedside daily    Lovena Neighbours, MD [facility], PGY-2 12/03/2017, 7:19 AM

## 2017-12-04 ENCOUNTER — Inpatient Hospital Stay (HOSPITAL_COMMUNITY): Payer: Medicare Other

## 2017-12-04 DIAGNOSIS — Z7189 Other specified counseling: Secondary | ICD-10-CM

## 2017-12-04 DIAGNOSIS — Z515 Encounter for palliative care: Secondary | ICD-10-CM

## 2017-12-04 LAB — HEPARIN LEVEL (UNFRACTIONATED)
Heparin Unfractionated: 0.28 IU/mL — ABNORMAL LOW (ref 0.30–0.70)
Heparin Unfractionated: 0.11 IU/mL — ABNORMAL LOW (ref 0.30–0.70)
Heparin Unfractionated: 0.55 IU/mL (ref 0.30–0.70)

## 2017-12-04 LAB — CBC WITH DIFFERENTIAL/PLATELET
BASOS ABS: 0.1 10*3/uL (ref 0.0–0.1)
Basophils Relative: 1 %
EOS ABS: 0.7 10*3/uL (ref 0.0–0.7)
EOS PCT: 7 %
HCT: 37.5 % — ABNORMAL LOW (ref 39.0–52.0)
Hemoglobin: 11.6 g/dL — ABNORMAL LOW (ref 13.0–17.0)
Lymphocytes Relative: 37 %
Lymphs Abs: 3.5 10*3/uL (ref 0.7–4.0)
MCH: 25.6 pg — ABNORMAL LOW (ref 26.0–34.0)
MCHC: 30.9 g/dL (ref 30.0–36.0)
MCV: 82.6 fL (ref 78.0–100.0)
MONO ABS: 0.5 10*3/uL (ref 0.1–1.0)
Monocytes Relative: 5 %
NEUTROS PCT: 50 %
Neutro Abs: 4.7 10*3/uL (ref 1.7–7.7)
PLATELETS: 220 10*3/uL (ref 150–400)
RBC: 4.54 MIL/uL (ref 4.22–5.81)
RDW: 22.7 % — ABNORMAL HIGH (ref 11.5–15.5)
WBC: 9.5 10*3/uL (ref 4.0–10.5)

## 2017-12-04 LAB — BASIC METABOLIC PANEL
Anion gap: 9 (ref 5–15)
BUN: 74 mg/dL — ABNORMAL HIGH (ref 6–20)
CO2: 28 mmol/L (ref 22–32)
Calcium: 8.6 mg/dL — ABNORMAL LOW (ref 8.9–10.3)
Chloride: 113 mmol/L — ABNORMAL HIGH (ref 101–111)
Creatinine, Ser: 1.24 mg/dL (ref 0.61–1.24)
Glucose, Bld: 138 mg/dL — ABNORMAL HIGH (ref 65–99)
POTASSIUM: 3.7 mmol/L (ref 3.5–5.1)
SODIUM: 150 mmol/L — AB (ref 135–145)

## 2017-12-04 LAB — GLUCOSE, CAPILLARY
GLUCOSE-CAPILLARY: 107 mg/dL — AB (ref 65–99)
GLUCOSE-CAPILLARY: 129 mg/dL — AB (ref 65–99)
GLUCOSE-CAPILLARY: 181 mg/dL — AB (ref 65–99)
GLUCOSE-CAPILLARY: 86 mg/dL (ref 65–99)
Glucose-Capillary: 83 mg/dL (ref 65–99)
Glucose-Capillary: 87 mg/dL (ref 65–99)

## 2017-12-04 LAB — BLOOD GAS, ARTERIAL
ACID-BASE EXCESS: 5.8 mmol/L — AB (ref 0.0–2.0)
BICARBONATE: 29.3 mmol/L — AB (ref 20.0–28.0)
Drawn by: 51155
FIO2: 30
LHR: 16 {breaths}/min
MECHVT: 690 mL
O2 Saturation: 96.6 %
PEEP/CPAP: 5 cmH2O
Patient temperature: 99.1
pCO2 arterial: 38.6 mmHg (ref 32.0–48.0)
pH, Arterial: 7.493 — ABNORMAL HIGH (ref 7.350–7.450)
pO2, Arterial: 89.8 mmHg (ref 83.0–108.0)

## 2017-12-04 LAB — MAGNESIUM: MAGNESIUM: 2.1 mg/dL (ref 1.7–2.4)

## 2017-12-04 MED ORDER — DOCUSATE SODIUM 100 MG PO CAPS
100.0000 mg | ORAL_CAPSULE | Freq: Every day | ORAL | Status: DC
  Administered 2017-12-05 – 2017-12-11 (×7): 100 mg via ORAL
  Filled 2017-12-04 (×7): qty 1

## 2017-12-04 MED ORDER — ISOSORB DINITRATE-HYDRALAZINE 20-37.5 MG PO TABS
1.0000 | ORAL_TABLET | Freq: Three times a day (TID) | ORAL | Status: DC
  Administered 2017-12-04 – 2017-12-11 (×22): 1 via ORAL
  Filled 2017-12-04 (×24): qty 1

## 2017-12-04 MED ORDER — CARVEDILOL 3.125 MG PO TABS: 3.1250 mg | ORAL_TABLET | Freq: Two times a day (BID) | ORAL | Status: DC

## 2017-12-04 MED ORDER — FUROSEMIDE 10 MG/ML IJ SOLN
40.0000 mg | Freq: Four times a day (QID) | INTRAMUSCULAR | Status: AC
  Administered 2017-12-04 (×3): 40 mg via INTRAVENOUS
  Filled 2017-12-04 (×3): qty 4

## 2017-12-04 MED ORDER — CARVEDILOL 3.125 MG PO TABS
3.1250 mg | ORAL_TABLET | Freq: Two times a day (BID) | ORAL | Status: DC
  Administered 2017-12-04 – 2017-12-11 (×16): 3.125 mg via ORAL
  Filled 2017-12-04 (×17): qty 1

## 2017-12-04 MED ORDER — PANTOPRAZOLE SODIUM 40 MG PO TBEC
40.0000 mg | DELAYED_RELEASE_TABLET | Freq: Every day | ORAL | Status: DC
  Administered 2017-12-04: 40 mg via ORAL
  Filled 2017-12-04: qty 1

## 2017-12-04 MED ORDER — SENNA 8.6 MG PO TABS
1.0000 | ORAL_TABLET | Freq: Every day | ORAL | Status: DC
  Administered 2017-12-05 – 2017-12-11 (×7): 8.6 mg via ORAL
  Filled 2017-12-04 (×7): qty 1

## 2017-12-04 MED ORDER — CLOPIDOGREL BISULFATE 75 MG PO TABS
75.0000 mg | ORAL_TABLET | Freq: Every day | ORAL | Status: DC
  Administered 2017-12-05 – 2017-12-11 (×7): 75 mg via ORAL
  Filled 2017-12-04 (×7): qty 1

## 2017-12-04 NOTE — Progress Notes (Signed)
eLink Physician-Brief Progress Note Patient Name: Cornelio Muncey DOB: 1956-04-19 MRN: [id number]   Date of Service  12/04/2017  HPI/Events of Note  No AM labs ordered.   eICU Interventions  Will order: 1. ABG, CBC, BMP and Mg++ level at 5 AM.     Intervention Category Major Interventions: Other:  Sommer,Steven Dennard Nip 12/04/2017, 3:48 AM

## 2017-12-04 NOTE — Progress Notes (Addendum)
PULMONARY / CRITICAL CARE MEDICINE   Name: Todd Sullivan MRN: [id number] DOB: 1955-12-11    ADMISSION DATE:  12/03/2017 CONSULTATION DATE:  12/05/2017  REFERRING MD: Dr. Wilkie Aye  CHIEF COMPLAINT:  SOB  HISTORY OF PRESENT ILLNESS:   HPI obtained from medical chart review as patient is currently intubated and sedated on mechanical ventilation.  62 year old male with past medical history significant for COPD, asthma, diabetes, CAD status post STEMI/ stent 06/2017, hypertension, and CVA who presented via EMS with complaints of shortness of breath and altered mental status. His significant other reports she has not done well since he MI 3 months ago.  Also reports he has not taken his medications for the last 5 days.  He has not been out of bed for the last 5 days.  Found him after he fell out of bed called 911 and was transported to [facility].  On arrival to the emergency room he was unable to protect his airway and was intubated.  He is to have a CT of his head to rule out CVA and/or trauma since he has had a history of CVA in the past.  On my arrival in the emergency room to trauma a he was sedated with propofol and going to CT scan for evaluation.  Neuro exam was difficult due to high level of sedation.  Difficult due to high level sedation  Note he had profound hypoglycemia otherwise not on insulin but he is on metformin.  He had to be started on dextrose 10% drip to maintain his glucose levels greater than 90.  Labs noted for BUN 53, sCr 3.18 (baseline 1.5-1.6), lactic acid 11.99 -> 9.52, BNP 1886, AST 1073, ALT 804, t. Bili 5.4, WBC 14.4, Plts 107, TSH 0.888, initial CXR showing pulmonary edema   We will admit to the intensive care unit continue to explore laboratory data evaluate CT scan of the head.  Treat underlying lactic acidosis, evaluate for infectious process and cardiology is to evaluate.  SUBJECTIVE / Interval Events:  No acute events overnight. Patient tried on a T  piece yesterday in an attempt to extubate. Patient did not tolerate, patient was placed back on pressure support.  VITAL SIGNS: BP 91/68   Pulse (!) 50   Temp 99.1 F (37.3 C) (Oral)   Resp 16   Ht 6\' 3"  (1.905 m)   Wt 288 lb 12.8 oz (131 kg)   SpO2 98%   BMI 36.10 kg/m   HEMODYNAMICS:    VENTILATOR SETTINGS: Vent Mode: PRVC FiO2 (%):  [30 %] 30 % Set Rate:  [16 bmp] 16 bmp Vt Set:  [680 mL-690 mL] 690 mL PEEP:  [5 cmH20] 5 cmH20 Pressure Support:  [12 cmH20] 12 cmH20 Plateau Pressure:  [18 cmH20-22 cmH20] 21 cmH20  INTAKE / OUTPUT: I/O last 3 completed shifts: In: 3697.6 [I.V.:1687.6; NG/GT:2010] Out: 3675 [Urine:3675]  PHYSICAL EXAMINATION: General: Obese man, ill-appearing, sedated, intubated Neuro:.  Less responsive than over the last few days, tries to open eyes and turns head to voice HEENT: Right IJ CVC in place ET tube in place Cardiovascular: Distant, regular, holosystolic murmur 2/6 Lungs: coarse bilaterally, decreased at both bases musculoskeletal: No deformity Skin: Trace lower extremity edema, improving  LABS:  BMET Recent Labs  Lab 12/02/17 0357 12/03/17 0417 12/04/17 0400  NA 147* 149* 150*  K 4.0 3.7 3.7  CL 109 111 113*  CO2 27 27 28   BUN 66* 71* 74*  CREATININE 1.45* 1.29* 1.24  GLUCOSE 149* 154*  138*    Electrolytes Recent Labs  Lab 11/29/17 0344  12/02/17 0357 12/03/17 0417 12/04/17 0400  CALCIUM 8.7*   < > 8.6* 8.6* 8.6*  MG 1.6*  --   --   --  2.1  PHOS 2.4*  --   --   --   --    < > = values in this interval not displayed.    CBC Recent Labs  Lab 12/02/17 0357 12/03/17 0417 12/04/17 0400  WBC 10.6* 10.7* 9.5  HGB 12.0* 11.9* 11.6*  HCT 37.9* 37.7* 37.5*  PLT 205 206 220    Coag's No results for input(s): APTT, INR in the last 168 hours.  Sepsis Markers Recent Labs  Lab 11/30/17 0331 12/01/17 0400 12/02/17 0357  PROCALCITON 0.63 0.42 0.35    ABG Recent Labs  Lab 12/04/17 0428  PHART 7.493*   PCO2ART 38.6  PO2ART 89.8    Liver Enzymes Recent Labs  Lab 11/30/17 0331  AST 59*  ALT 116*  ALKPHOS 109  BILITOT 2.2*  ALBUMIN 2.4*    Cardiac Enzymes No results for input(s): TROPONINI, PROBNP in the last 168 hours.  Glucose Recent Labs  Lab 12/03/17 0741 12/03/17 1153 12/03/17 1552 12/03/17 2000 12/03/17 2355 12/04/17 0346  GLUCAP 152* 110* 137* 134* 130* 129*    Imaging No results found.   STUDIES:  11/20/2017 CT of the head>>No acute intracranial abnormalities CXR 2/15 >>Progression of bibasilar airspace disease and small pleural effusion.   CULTURES: 11/25/2017 blood cultures x2>>NGTD 11/17/2017 sputum culture>>NGTD 11/22/2017 urine culture>>NGTD 11/17/2017 respiratory>> Candida albicans 11/25/2017 pro-calcitonin>>1.85 12/01/2017 respiratory virus panel>>Negative  12/03/2017 repi  ANTIBIOTICS: 2/13 Levaquin>>2/14 2/14 Ceftriaxone >>2/18 2/19 Ciprofloxacin gtt >>  SIGNIFICANT EVENTS: 11/19/2017 intubated for respiratory failure   LINES/TUBES: 11/28/2017 endotracheal tube>> 12/02/2017 right internal jugular CVL>> 12/01/2017 PICC>>  DISCUSSION: 62 year old morbidly obese male who modality has been smoking recently states he is on NicoDerm patches.  He is noted to be and have been declining over the past 3 months following cardiac cath MI and stent placement.  He has not taken his medication for the last 5 days nor has he been out of bed for the last 5 days.  Significant other noted that he got up and fell during the night EMS was activated his transport to the emergency room he was unable to protect his airway and was intubated.  ASSESSMENT / PLAN:  PULMONARY A: Vent dependent respiratory failure secondary to inability to protect airway. CXR improving. Cardiogenic pulmonary edema-improving Probable CAP left lower lobe-completed ceftriaxone therapy  P:   Continue current ventilator support, work on pressure support and SBT will attempt extubation  after T piece trial. Follow up chest x-ray Continue diuresis, volume optimization   CARDIOVASCULAR A:  Coronary artery disease history with MI 3 (9/18) primary PCI to dominant left circumflex and PDA with synergy drug-eluting stent.   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  CHF-EF 10-15%, diffuse hypokinesis and akinesis of the apical myocardium. Decrease HR and low BP this am in the setting on BB initiation yesterday.   P:  Cardiology is signing off, but available as needed Hold  Coreg 3.25 mg this am Continue Bidil, full tab Continue Captopril 6.25 mg  Continue furosemide 40 mg daily Continue Plavix, heparin drip Started Coumadin transition (2/23)   RENAL A:   Acute renal failure with last known creatinine 1.29>>1.24. Stable. Presume cardiorenal syndrome. Continue to have good UOP. Hypernatremia-149>>150.   P:   Lasix 40 mg  daily  Continue free water 200cc q6 Follow urine output  Daily BMP   GASTROINTESTINAL A:   Transaminitis. Resolved. Hepatitis panel negative, RUQ [place] without any evidence of acute cholecystitis.  Suspect shock liver.   Morbid obesity GI protection Constipation- Large BM 2/22  P:   Continue PPI Continue tube feeding as tolerated Bowel regimen   HEMATOLOGIC A:  Large apical thrombus seen on Echo 2/14 with akinetic apex.  Platelets and Hemoglobin are stable. If patient tolerate extubation will consider starting Coumadin.  P:  Continue Heparin as ordered Continue Plavix Stop coumadin transition, unsure if patient will need trach.   INFECTIOUS A:   -Suspected left lower lobe CAP. Cultures and tracheal aspriates have been negative. Patient completed 7 days antibiotic course. Influenza and RVP negative. -New rash noted on torso, abdomen, upper and lower extremities-improving with steroids. Likely to be allergic reaction to chlorhexidine pads. -Bilateral discharge from eyes concerning bacterial  conjunctivitis-improved -Oral abscess likely cause of intermittent fever noted past few days   P:   Prednisone 20 mg bid completed 2/23 Fever curve Continue cipro ophthalmic solution  ENDOCRINE CBG (last 3)  Recent Labs    12/03/17 2000 12/03/17 2355 12/04/17 0346  GLUCAP 134* 130* 129*    A:   T2DM, A1c 6.5.  Initial hypoglycemia, resolved  P:   Enteral diabetic medications on hold SSI per protocol  NEUROLOGIC A:   Acute encephalopathy, toxic metabolic +possibly substances Cocaine abuse. Following commands still mildly agitated.   P:   Will discontinue propofol Currently on Precedex, wean a tolerated  Wean sedation as able. RASS goal: -1,0   FAMILY  - Updates: Girlfriend at bedside daily  Lovena Neighbours, MD [facility], PGY-2 12/04/2017, 7:07 AM  Attending Note:  62 year old male with cocaine abuse history presenting with an EF of 10% and an apical thrombus.  Patient was intubated and is weaning some this AM but RR is elevated which is difficult to r/o if it is agitation vs true respiratory failure.  On exam, patient is awake and interactive, following all commands.  I reviewed CXR myself, pulmonary edema noted.  I spoke with patient and fiance bedside.  I asked him about reintubation and trach which he nodded and wrote absolutely no to.  He did ask that we contact his sister rozina to inform her of his decision.  Will hold TF for potential extubation today.  Will give a dose of lasix prior and will order morning labs.  Once extubated, will make DNR according to his wishes.  Will communicate with family first prior to extubation.  The patient is critically ill with multiple organ systems failure and requires high complexity decision making for assessment and support, frequent evaluation and titration of therapies, application of advanced monitoring technologies and extensive interpretation of multiple databases.   Critical Care Time devoted to  patient care services described in this note is  35  Minutes. This time reflects time of care of this signee Dr Koren Bound. This critical care time does not reflect procedure time, or teaching time or supervisory time of PA/NP/Med student/Med Resident etc but could involve care discussion time.  Alyson Reedy, M.D. [facility] Pulmonary/Critical Care Medicine. Pager: [phone]. After hours pager: [phone].

## 2017-12-04 NOTE — Progress Notes (Signed)
Addendum:  Patient wanted us to speak to his sister.  We were only able to get a hold of renee, attempted the other sister 4 times without success.  After contacting renee, she informed us that the patient had spoken to her in the past about his wishes and that he does not wish to remain on life support.  I informed her that we will give him some lasix then extubate but that chances are that he will not do well but these are his wishes.  She expressed understanding and informed me that she will inform the rest of the family.  I went back to speak with patient and informed him that his family will respect his wishes and that we will extubate after we give him some lasix and change him to DNR and informed him that we will not be inserting the ETT back in and will not be performing CPR or cardioversion which he was agreeable with and his fiance was bedside and agreed with the same thing.  Will proceed with extubation.  The patient is critically ill with multiple organ systems failure and requires high complexity decision making for assessment and support, frequent evaluation and titration of therapies, application of advanced monitoring technologies and extensive interpretation of multiple databases.   Critical Care Time devoted to patient care services described in this note is  60  Minutes. This time reflects time of care of this signee Dr Thayne Cindric. This critical care time does not reflect procedure time, or teaching time or supervisory time of PA/NP/Med student/Med Resident etc but could involve care discussion time.  Psalms Olarte G. Jaden Batchelder, M.D. [place] Pulmonary/Critical Care Medicine. Pager: [phone]. After hours pager: [phone]. 

## 2017-12-04 NOTE — Progress Notes (Signed)
ANTICOAGULATION CONSULT NOTE   Pharmacy Consult for Heparin Indication: new apical thrombus  Allergies  Allergen Reactions  . Banana     Sick   . Chlorhexidine Gluconate     Caused contact dermatitis on skin  . Egg Yolk Nausea And Vomiting  . Kiwi Extract Nausea And Vomiting    Patient Measurements: Height: 6\' 3"  (190.5 cm) Weight: 288 lb 12.8 oz (131 kg) IBW/kg (Calculated) : 84.5 Heparin Dosing Weight: 116 kg  Vital Signs: Temp: 99.1 F (37.3 C) (02/25 0347) Temp Source: Oral (02/25 0347) BP: 87/66 (02/25 0300) Pulse Rate: 66 (02/25 0326)  Labs: Recent Labs    12/02/17 0357  12/02/17 2042 12/03/17 0417 12/04/17 0400  HGB 12.0*  --   --  11.9* 11.6*  HCT 37.9*  --   --  37.7* 37.5*  PLT 205  --   --  206 PENDING  HEPARINUNFRC 0.79*   < > 0.64 0.37 0.28*  CREATININE 1.45*  --   --  1.29*  --    < > = values in this interval not displayed.    Estimated Creatinine Clearance: 88.8 mL/min (A) (by C-G formula based on SCr of 1.29 mg/dL (H)).  Assessment: 62 y.o. male with CHF and apical thrombus. Pharmacy is dosing heparin.   Heparin level is SUBtherapeutic. Will increase dose. Oral anticoagulation has been held due to possible need for trach.  Goal of Therapy:  Heparin level 0.3-0.7 units/ml Monitor platelets by anticoagulation protocol: Yes   Plan:  Increase heparin to 1500 units/hr Check confirmatory level Daily heparin level and CBC    Baldemar Friday 12/04/2017 4:47 AM

## 2017-12-04 NOTE — Progress Notes (Signed)
ANTICOAGULATION CONSULT NOTE   Pharmacy Consult for Heparin Indication: new apical thrombus  Allergies  Allergen Reactions  . Banana     Sick   . Chlorhexidine Gluconate     Caused contact dermatitis on skin  . Egg Yolk Nausea And Vomiting  . Kiwi Extract Nausea And Vomiting    Patient Measurements: Height: 6\' 3"  (190.5 cm) Weight: 288 lb 12.8 oz (131 kg) IBW/kg (Calculated) : 84.5 Heparin Dosing Weight: 116 kg  Vital Signs: Temp: 98.7 F (37.1 C) (02/25 2014) Temp Source: Oral (02/25 2014) BP: 117/87 (02/25 1800) Pulse Rate: 113 (02/25 1800)  Labs: Recent Labs    12/02/17 0357  12/03/17 0417 12/04/17 0400 12/04/17 1028 12/04/17 1947  HGB 12.0*  --  11.9* 11.6*  --   --   HCT 37.9*  --  37.7* 37.5*  --   --   PLT 205  --  206 220  --   --   HEPARINUNFRC 0.79*   < > 0.37 0.28* 0.11* 0.55  CREATININE 1.45*  --  1.29* 1.24  --   --    < > = values in this interval not displayed.    Estimated Creatinine Clearance: 92.4 mL/min (by C-G formula based on SCr of 1.24 mg/dL).  Assessment: 62 y.o. male with CHF and apical thrombus. Pharmacy is dosing heparin.   Heparin level is 0.55, therapeutic after rate was increased to 1700 units/hr this morning.  Goal of Therapy:  Heparin level 0.3-0.7 units/ml Monitor platelets by anticoagulation protocol: Yes   Plan:  Continue heparin at current rate F/u AM labs  Bayard Hugger, PharmD, BCPS  Clinical Pharmacist  Pager: [phone]   12/04/2017 8:41 PM

## 2017-12-04 NOTE — Progress Notes (Signed)
ANTICOAGULATION CONSULT NOTE   Pharmacy Consult for Heparin Indication: new apical thrombus  Allergies  Allergen Reactions  . Banana     Sick   . Chlorhexidine Gluconate     Caused contact dermatitis on skin  . Egg Yolk Nausea And Vomiting  . Kiwi Extract Nausea And Vomiting    Patient Measurements: Height: 6\' 3"  (190.5 cm) Weight: 288 lb 12.8 oz (131 kg) IBW/kg (Calculated) : 84.5 Heparin Dosing Weight: 116 kg  Vital Signs: Temp: 99.4 F (37.4 C) (02/25 0907) Temp Source: Oral (02/25 0907) BP: 127/106 (02/25 1100) Pulse Rate: 123 (02/25 1100)  Labs: Recent Labs    12/02/17 0357  12/03/17 0417 12/04/17 0400 12/04/17 1028  HGB 12.0*  --  11.9* 11.6*  --   HCT 37.9*  --  37.7* 37.5*  --   PLT 205  --  206 220  --   HEPARINUNFRC 0.Todd*   < > 0.37 0.28* 0.11*  CREATININE 1.45*  --  1.29* 1.24  --    < > = values in this interval not displayed.    Estimated Creatinine Clearance: 92.4 mL/min (by C-G formula based on SCr of 1.24 mg/dL).  Assessment: 62 y.o. Sullivan with CHF and apical thrombus. Pharmacy is dosing heparin.   Heparin level is SUBtherapeutic this morning. Infusion has been running without any issues per RN. CBC this AM was stable and no signs of bleeding have been noted. Patient is now extubated.   Goal of Therapy:  Heparin level 0.3-0.7 units/ml Monitor platelets by anticoagulation protocol: Yes   Plan:  Increase heparin to 1700 units/hr 6 hour heparin level Daily heparin level and CBC Monitor for signs/symptoms of bleeding  Sharin Mons, PharmD, BCPS PGY2 Infectious Diseases Pharmacy Resident Pager: [phone]  12/04/2017 11:34 AM

## 2017-12-04 NOTE — Procedures (Signed)
Extubation Procedure Note  Patient Details:   Name: Todd Sullivan DOB: [birth year] MRN: [id number]   Airway Documentation:     Evaluation  O2 sats: stable throughout Complications: No apparent complications Patient did tolerate procedure well. Bilateral Breath Sounds: Rhonchi, Diminished   Yes   Positive cuff leak noted.  Pt placed on [place] 4 L with humidity, no stridor noted, pt tolerating well at this time.  Pt able to reach 250 mL using incentive spirometer.  Pt will need coaching to improve using IS.  RN notified.  Family at bedside.  Todd Sullivan Todd Sullivan 12/04/2017, 11:11 AM

## 2017-12-05 ENCOUNTER — Inpatient Hospital Stay (HOSPITAL_COMMUNITY): Payer: Medicare Other

## 2017-12-05 DIAGNOSIS — R0902 Hypoxemia: Secondary | ICD-10-CM

## 2017-12-05 DIAGNOSIS — E876 Hypokalemia: Secondary | ICD-10-CM

## 2017-12-05 LAB — GLUCOSE, CAPILLARY
GLUCOSE-CAPILLARY: 81 mg/dL (ref 65–99)
GLUCOSE-CAPILLARY: 92 mg/dL (ref 65–99)
GLUCOSE-CAPILLARY: 81 mg/dL (ref 65–99)
Glucose-Capillary: 92 mg/dL (ref 65–99)
Glucose-Capillary: 102 mg/dL — ABNORMAL HIGH (ref 65–99)

## 2017-12-05 LAB — CBC
HCT: 37.9 % — ABNORMAL LOW (ref 39.0–52.0)
Hemoglobin: 11.8 g/dL — ABNORMAL LOW (ref 13.0–17.0)
MCH: 25.8 pg — ABNORMAL LOW (ref 26.0–34.0)
MCHC: 31.1 g/dL (ref 30.0–36.0)
MCV: 82.9 fL (ref 78.0–100.0)
PLATELETS: 292 10*3/uL (ref 150–400)
RBC: 4.57 MIL/uL (ref 4.22–5.81)
RDW: 22.7 % — ABNORMAL HIGH (ref 11.5–15.5)
WBC: 10.3 10*3/uL (ref 4.0–10.5)

## 2017-12-05 LAB — BASIC METABOLIC PANEL
ANION GAP: 13 (ref 5–15)
BUN: 55 mg/dL — ABNORMAL HIGH (ref 6–20)
CO2: 28 mmol/L (ref 22–32)
Calcium: 8.9 mg/dL (ref 8.9–10.3)
Chloride: 108 mmol/L (ref 101–111)
Creatinine, Ser: 1.17 mg/dL (ref 0.61–1.24)
GLUCOSE: 87 mg/dL (ref 65–99)
Potassium: 3.3 mmol/L — ABNORMAL LOW (ref 3.5–5.1)
Sodium: 149 mmol/L — ABNORMAL HIGH (ref 135–145)

## 2017-12-05 LAB — PHOSPHORUS: Phosphorus: 3.7 mg/dL (ref 2.5–4.6)

## 2017-12-05 LAB — HEPARIN LEVEL (UNFRACTIONATED): Heparin Unfractionated: 0.46 IU/mL (ref 0.30–0.70)

## 2017-12-05 LAB — MAGNESIUM: MAGNESIUM: 1.9 mg/dL (ref 1.7–2.4)

## 2017-12-05 MED ORDER — POTASSIUM CHLORIDE CRYS ER 20 MEQ PO TBCR
40.0000 meq | EXTENDED_RELEASE_TABLET | Freq: Four times a day (QID) | ORAL | Status: AC
  Administered 2017-12-05 (×3): 40 meq via ORAL
  Filled 2017-12-05 (×4): qty 2

## 2017-12-05 MED ORDER — FUROSEMIDE 10 MG/ML IJ SOLN
40.0000 mg | Freq: Three times a day (TID) | INTRAMUSCULAR | Status: AC
  Administered 2017-12-05 (×2): 40 mg via INTRAVENOUS
  Filled 2017-12-05 (×3): qty 4

## 2017-12-05 NOTE — Progress Notes (Signed)
ANTICOAGULATION CONSULT NOTE   Pharmacy Consult for Heparin Indication: new apical thrombus  Allergies  Allergen Reactions  . Banana     Sick   . Chlorhexidine Gluconate     Caused contact dermatitis on skin  . Egg Yolk Nausea And Vomiting  . Kiwi Extract Nausea And Vomiting    Patient Measurements: Height: 6\' 3"  (190.5 cm) Weight: 291 lb 0.1 oz (132 kg) IBW/kg (Calculated) : 84.5 Heparin Dosing Weight: 116 kg  Vital Signs: Temp: 98.3 F (36.8 C) (02/26 0331) Temp Source: Oral (02/26 0331) BP: 113/88 (02/26 0600) Pulse Rate: 105 (02/26 0600)  Labs: Recent Labs    12/03/17 0417 12/04/17 0400 12/04/17 1028 12/04/17 1947 12/05/17 0427 12/05/17 0428  HGB 11.9* 11.6*  --   --   --  11.8*  HCT 37.7* 37.5*  --   --   --  37.9*  PLT 206 220  --   --   --  292  HEPARINUNFRC 0.37 0.28* 0.11* 0.55 0.46  --   CREATININE 1.29* 1.24  --   --   --  1.17    Estimated Creatinine Clearance: 98.3 mL/min (by C-G formula based on SCr of 1.17 mg/dL).  Assessment: 62 y.o. male with CHF and apical thrombus. Pharmacy is dosing heparin.   Heparin level this morning remains therapeutic at 0.46 on 1700 units/hr. CBC is stable and no signs of bleeding have been noted.   Goal of Therapy:  Heparin level 0.3-0.7 units/ml Monitor platelets by anticoagulation protocol: Yes   Plan:  Continue heparin at 1700 units/hr Daily heparin level/CBC Monitor for signs/symptoms of bleeding  F/U transition to warfarin   Sharin Mons, PharmD, BCPS PGY2 Infectious Diseases Pharmacy Resident Pager: [phone]  12/05/2017 7:06 AM

## 2017-12-05 NOTE — Progress Notes (Signed)
Patient arrived to 6n02, IV fluids infusing, scd's on, patient on 11L high flow but comfortable. Patient reports no pain at the moment. Foley cath in place, bottom has two foam dressings one which fell off in process of turning onto new bed (will replace). No family in room at moment, will continue to monitor. Bed alarm on for safety patient not axox4.

## 2017-12-05 NOTE — Progress Notes (Signed)
Patient with multiple blood pressure and heart meds. Patient is not agreeable to any tube to administer medications at this time. Patient had been given meds in applesauce earlier today with no incident. Spoke with MD about current options given speech report. Order to give patient meds PO route. Patient was given two potassium tablets (both halved) in apple sauce with no problems. Patient did not experience any coughing or throat clearing. Will continue to monitor. Speech to follow up tomorrow.

## 2017-12-05 NOTE — Progress Notes (Addendum)
PULMONARY / CRITICAL CARE MEDICINE   Name: Todd Sullivan MRN: [id number] DOB: [birth year]    ADMISSION DATE:  12/04/2017 CONSULTATION DATE:  11/16/2017  REFERRING MD: Dr. Wilkie Aye  CHIEF COMPLAINT:  SOB  HISTORY OF PRESENT ILLNESS:   HPI obtained from medical chart review as patient is currently intubated and sedated on mechanical ventilation.  62 year old male with past medical history significant for COPD, asthma, diabetes, CAD status post STEMI/ stent 06/2017, hypertension, and CVA who presented via EMS with complaints of shortness of breath and altered mental status. His significant other reports she has not done well since he MI 3 months ago.  Also reports he has not taken his medications for the last 5 days.  He has not been out of bed for the last 5 days.  Found him after he fell out of bed called 911 and was transported to [facility].  On arrival to the emergency room he was unable to protect his airway and was intubated.  He is to have a CT of his head to rule out CVA and/or trauma since he has had a history of CVA in the past.  On my arrival in the emergency room to trauma a he was sedated with propofol and going to CT scan for evaluation.  Neuro exam was difficult due to high level of sedation.  Difficult due to high level sedation  Note he had profound hypoglycemia otherwise not on insulin but he is on metformin.  He had to be started on dextrose 10% drip to maintain his glucose levels greater than 90.  Labs noted for BUN 53, sCr 3.18 (baseline 1.5-1.6), lactic acid 11.99 -> 9.52, BNP 1886, AST 1073, ALT 804, t. Bili 5.4, WBC 14.4, Plts 107, TSH 0.888, initial CXR showing pulmonary edema   We will admit to the intensive care unit continue to explore laboratory data evaluate CT scan of the head.  Treat underlying lactic acidosis, evaluate for infectious process and cardiology is to evaluate.  SUBJECTIVE / Interval Events:  No acute events overnight. Patient tried on a T  piece yesterday in an attempt to extubate. Patient did not tolerate, patient was placed back on pressure support.  VITAL SIGNS: BP 113/88   Pulse (!) 105   Temp 98.3 F (36.8 C) (Oral)   Resp (!) 31   Ht 6\' 3"  (1.905 m)   Wt 291 lb 0.1 oz (132 kg)   SpO2 (!) 88%   BMI 36.37 kg/m   HEMODYNAMICS:    VENTILATOR SETTINGS: Vent Mode: PSV;CPAP FiO2 (%):  [30 %] 30 % PEEP:  [5 cmH20] 5 cmH20 Pressure Support:  [5 cmH20] 5 cmH20  INTAKE / OUTPUT: I/O last 3 completed shifts: In: 3181.6 [I.V.:1381.6; NG/GT:1800] Out: 4150 [Urine:4150]  PHYSICAL EXAMINATION: General: Obese man, ill-appearing, sedated, intubated Neuro:.  Less responsive than over the last few days, tries to open eyes and turns head to voice HEENT: Right IJ CVC in place ET tube in place Cardiovascular: Distant, regular, holosystolic murmur 2/6 Lungs: coarse bilaterally, decreased at both bases musculoskeletal: No deformity Skin: Trace lower extremity edema, improving  LABS:  BMET Recent Labs  Lab 12/03/17 0417 12/04/17 0400 12/05/17 0428  NA 149* 150* 149*  K 3.7 3.7 3.3*  CL 111 113* 108  CO2 27 28 28   BUN 71* 74* 55*  CREATININE 1.29* 1.24 1.17  GLUCOSE 154* 138* 87    Electrolytes Recent Labs  Lab 11/29/17 0344  12/03/17 0417 12/04/17 0400 12/05/17 0428  CALCIUM 8.7*   < >  8.6* 8.6* 8.9  MG 1.6*  --   --  2.1 1.9  PHOS 2.4*  --   --   --  3.7   < > = values in this interval not displayed.    CBC Recent Labs  Lab 12/03/17 0417 12/04/17 0400 12/05/17 0428  WBC 10.7* 9.5 10.3  HGB 11.9* 11.6* 11.8*  HCT 37.7* 37.5* 37.9*  PLT 206 220 292    Coag's No results for input(s): APTT, INR in the last 168 hours.  Sepsis Markers Recent Labs  Lab 11/30/17 0331 12/01/17 0400 12/02/17 0357  PROCALCITON 0.63 0.42 0.35    ABG Recent Labs  Lab 12/04/17 0428  PHART 7.493*  PCO2ART 38.6  PO2ART 89.8    Liver Enzymes Recent Labs  Lab 11/30/17 0331  AST 59*  ALT 116*  ALKPHOS  109  BILITOT 2.2*  ALBUMIN 2.4*    Cardiac Enzymes No results for input(s): TROPONINI, PROBNP in the last 168 hours.  Glucose Recent Labs  Lab 12/04/17 0733 12/04/17 1132 12/04/17 1529 12/04/17 2013 12/04/17 2333 12/05/17 0329  GLUCAP 181* 107* 83 87 86 92    Imaging Dg Chest Port 1 View  Result Date: 12/04/2017 CLINICAL DATA:  Intubated patient, SS positioning of endotracheal tube, respiratory failure, CHF, morbid obesity, current smoker. EXAM: PORTABLE CHEST 1 VIEW COMPARISON:  Portable chest x-ray of December 03, 2017 FINDINGS: The lungs are adequately inflated. The interstitial markings remain increased in the right mid and lower lung. There is subsegmental atelectasis at the left lung base with a trace of pleural fluid. The cardiac silhouette remains enlarged. The central pulmonary vascularity is prominent. The endotracheal tube tip lies 7.2 cm above the carina with the tip approximately 1 cm inferior to the inferior margin of the clavicular heads. The esophagogastric tube tip in proximal port project below the inferior margin of the image. The right PICC line tip projects over the distal third of the SVC. The right internal jugular venous catheter tip projects over the midportion of the SVC. IMPRESSION: Reasonable positioning of the support tubes. Persistent increased interstitial markings greatest on the right likely reflects edema. Cardiomegaly, left basilar atelectasis, and small left pleural effusion. Electronically Signed   By: David  [place] M.D.   On: 12/04/2017 07:43     STUDIES:  12-04-17 CT of the head>>No acute intracranial abnormalities CXR 2/15 >>Progression of bibasilar airspace disease and small pleural effusion.   CULTURES: 12-04-17 blood cultures x2>>NGTD December 04, 2017 sputum culture>>NGTD 2017-12-04 urine culture>>NGTD 12-04-17 respiratory>> Candida albicans 04-Dec-2017 pro-calcitonin>>1.85 12-04-17 respiratory virus panel>>Negative  12/03/2017  repi  ANTIBIOTICS: 2/13 Levaquin>>2/14 2/14 Ceftriaxone >>2/18 2/19 Ciprofloxacin gtt >>  SIGNIFICANT EVENTS: 12-04-17 intubated for respiratory failure   LINES/TUBES: 04-Dec-2017 endotracheal tube>> Dec 04, 2017 right internal jugular CVL>> 12/01/2017 PICC>>  DISCUSSION: 62 year old morbidly obese male who modality has been smoking recently states he is on NicoDerm patches.  He is noted to be and have been declining over the past 3 months following cardiac cath MI and stent placement.  He has not taken his medication for the last 5 days nor has he been out of bed for the last 5 days.  Significant other noted that he got up and fell during the night EMS was activated his transport to the emergency room he was unable to protect his airway and was intubated.  ASSESSMENT / PLAN:  PULMONARY A: Vent dependent respiratory failure secondary to inability to protect airway. Patient extubated on 2/26 tolerated well. Placed on [place] and maintained good O2 sat.  Cardiogenic pulmonary edema-improved Probable CAP left lower lobe-completed ceftriaxone therapy  P:   Oxygen therapy as needed  Follow up chest x-ray Continue diuresis, volume optimization   CARDIOVASCULAR A:  Coronary artery disease history with MI 3 (9/18) primary PCI to dominant left circumflex and PDA with synergy drug-eluting stent.   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  CHF-EF 10-15%, diffuse hypokinesis and akinesis of the apical myocardium. Increase HR after stopping sedation. Restarted BB with noted improvement in HR and BP.  P:  Cardiology is signing off, but available as needed Continue Coreg 3.25 mg this am Continue Bidil, full tab Continue Captopril 6.25 mg  Continue furosemide 40 mg daily Continue Plavix, heparin drip Will likely start transitioning to Coumadin in anticipation of transfer   RENAL A:   Acute renal failure with last known creatinine 1.24>>1.17. Stable. Presume  cardiorenal syndrome. Continue to have very good UOP. Hypernatremia-150>>149.   P:   Lasix 40 mg daily  Increase free water 300cc q6 Follow urine output  Daily BMP   GASTROINTESTINAL A:   Transaminitis. Resolved. Hepatitis panel negative, RUQ [place] without any evidence of acute cholecystitis.  Suspect shock liver.   Morbid obesity GI protection Constipation- Large BM 2/22  P:   Resume heart healthy diet Bowel regimen   HEMATOLOGIC A:  Large apical thrombus seen on Echo 2/14 with akinetic apex.  Platelets and Hemoglobin are stable. If patient tolerate extubation will consider starting Coumadin.  P:  Continue Heparin as ordered Continue Plavix Consider starting coumadin transition   INFECTIOUS A:   -Suspected left lower lobe CAP.  Patient completed 7 days antibiotic course. -New rash noted on torso, abdomen, upper and lower extremities, allergic rx -resolved -Bilateral discharge from eyes concerning bacterial conjunctivitis-improved -Oral abscess  P:   Fever curve Continue cipro ophthalmic solution  ENDOCRINE CBG (last 3)  Recent Labs    12/04/17 2013 12/04/17 2333 12/05/17 0329  GLUCAP 87 86 92    A:   T2DM, A1c 6.5.  Initial hypoglycemia, resolved  P:   Enteral diabetic medications on hold SSI per protocol  NEUROLOGIC A:   Acute encephalopathy, toxic metabolic-resolved. AOx4   P:   RASS goal: 0    FAMILY  - Updates: Girlfriend, children and sisters at bedside.   Lovena Neighbours, MD [facility], PGY-2 12/05/2017, 6:50 AM  Attending Note:  62 year old male with history of cocaine abuse and EF of 10%.  Started on coreg by cards but remains hypertensive.  Patient was extubated with no intention to reintubate.  On exam, lungs with bibasilar crackles.  I reviewed CXR myself, pulmonary edema and some pleural effusion but is diuresing very well.  Discussed with TR[facility]-MD.  Acute respiratory failure:  - Monitor for aspiration and  airway protection  - PT/OT  - OOB to chair  Hypoxemia:  - Titrate O2 for sat of 88-92%  Pulmonary edema:  - Extra dose of lasix given  Hypokalemia:  - K-dur 40 x3 doses  - BMET in AM  Dispo:  - Transfer to med-surg and to [facility] service with PCCM off 2/27.  Patient seen and examined, agree with above note.  I dictated the care and orders written for this patient under my direction.  Alyson Reedy, MD [phone]

## 2017-12-05 NOTE — Evaluation (Signed)
Physical Therapy Evaluation Patient Details Name: Todd Sullivan MRN: [id number] DOB: [birth year] Today's Date: 12/05/2017   History of Present Illness  62 y.o. male admitted on 11/11/2017 for SOB.  He was intubated in the ED and extubated on 12/04/17.  At the time of PT evaluation he was on 10-15 L of O2 via HFNC.  Pt dx with probably L LL CAP, and toxic metabolic encepholopathy.  Pt with pulmonary edema and pleural effusion started on lasix.  Other significant PMH includes stroke, HTN, gout, DM, COPD, CHF, asthma, anxiety,  MI s/p revasularization, and EF of 10%.    Clinical Impression  Pt is extremely weak and deconditioned with improving cognitive deficits.  Family is very supportive and helpful with his mobility.  He was too weak to stand even with two person assist, but we sat EOB for 20-30 mins and worked on tolerance to upright position, sitting balance, and LE exercises.  We practiced incentive spirometer and he was only able to get 500 mL max inspired volume.  He was on ~13 L HFNC and VSS throughout session.  He would be an excellent CIR candidate.   PT to follow acutely for deficits listed below.       Follow Up Recommendations CIR;Supervision/Assistance - 24 hour    Equipment Recommendations  Rolling walker with 5" wheels;Wheelchair (measurements PT);Wheelchair cushion (measurements PT);Hospital bed    Recommendations for Other Services Rehab consult;OT consult     Precautions / Restrictions Precautions Precautions: Fall;Other (comment) Precaution Comments: monitor vitals closely      Mobility  Bed Mobility Overal bed mobility: Needs Assistance Bed Mobility: Supine to Sit;Sit to Supine     Supine to sit: HOB elevated;Mod assist Sit to supine: +2 for physical assistance;Max assist   General bed mobility comments: Pt with significant more difficulty transitioning back into the bed after sitting up for nearly 20 mins EOB.  He was fatigued and had difficulty controlling his  trunk down to the bed and his legs back into bed against gravity.  His sister helped get him back and pulled up.   Transfers                 General transfer comment: Pt is too weak to safely attempt at this time without a second person, and honestly, he will likely need lift equipment initially.   Ambulation/Gait             General Gait Details: unable at this time.          Balance Overall balance assessment: Needs assistance Sitting-balance support: Feet supported;Bilateral upper extremity supported Sitting balance-Leahy Scale: Fair Sitting balance - Comments: close supervision and cueing to hold himself up as he consistantly loses his balance to the left in sitting.   Postural control: Left lateral lean                                   Pertinent Vitals/Pain Pain Assessment: No/denies pain    Home Living Family/patient expects to be discharged to:: Private residence Living Arrangements: Spouse/significant other Available Help at Discharge: Family;Personal care attendant Type of Home: House Home Access: Stairs to enter Entrance Stairs-Rails: (post on the left) Entrance Stairs-Number of Steps: 4 Home Layout: One level Home Equipment: Cane - single point Additional Comments: Pt reports he has an aide who helps with heavier housework.     Prior Function Level of Independence: Independent with assistive device(s)  Comments: Uses a cane for gait, does his own bathing and dressing.  Needs assist with heavier chores.  Per pt he used 2-3 L O2 Black PRN at home.      Hand Dominance        Extremity/Trunk Assessment   Upper Extremity Assessment Upper Extremity Assessment: Generalized weakness    Lower Extremity Assessment Lower Extremity Assessment: Generalized weakness(3-/5 throughout, equal bil)    Cervical / Trunk Assessment Cervical / Trunk Assessment: Normal  Communication   Communication: Other (comment)(low tone, soft  spoken)  Cognition Arousal/Alertness: Awake/alert Behavior During Therapy: Restless;Impulsive Overall Cognitive Status: Impaired/Different from baseline Area of Impairment: Problem solving;Awareness;Safety/judgement                         Safety/Judgement: Decreased awareness of safety;Decreased awareness of deficits Awareness: Intellectual Problem Solving: Slow processing;Decreased initiation;Difficulty sequencing;Requires verbal cues;Requires tactile cues General Comments: Pt, when asked direct questions usually gets them right given increased time to process and think about the question. He is often tangental and mutters some non-sensical things during our session. He recognizes his family in the room and is able to call them by name.       General Comments      Exercises General Exercises - Lower Extremity Long Arc Quad: AROM;Both;5 reps Hip Flexion/Marching: AROM;Both;5 reps Toe Raises: AROM;Both;5 reps   Assessment/Plan    PT Assessment Patient needs continued PT services  PT Problem List Decreased strength;Decreased activity tolerance;Decreased balance;Decreased mobility;Decreased knowledge of use of DME;Impaired sensation;Obesity;Cardiopulmonary status limiting activity       PT Treatment Interventions DME instruction;Gait training;Functional mobility training;Therapeutic activities;Therapeutic exercise;Balance training;Neuromuscular re-education;Cognitive remediation;Patient/family education    PT Goals (Current goals can be found in the Care Plan section)  Acute Rehab PT Goals Patient Stated Goal: to get better PT Goal Formulation: With patient/family Time For Goal Achievement: 12/19/17 Potential to Achieve Goals: Good    Frequency Min 3X/week    AM-PAC PT "6 Clicks" Daily Activity  Outcome Measure Difficulty turning over in bed (including adjusting bedclothes, sheets and blankets)?: Unable Difficulty moving from lying on back to sitting on the side of  the bed? : Unable Difficulty sitting down on and standing up from a chair with arms (e.g., wheelchair, bedside commode, etc,.)?: Unable Help needed moving to and from a bed to chair (including a wheelchair)?: Total Help needed walking in hospital room?: Total Help needed climbing 3-5 steps with a railing? : Total 6 Click Score: 6    End of Session Equipment Utilized During Treatment: Oxygen(13 L O2 HFNC) Activity Tolerance: Patient limited by fatigue Patient left: in bed;with call bell/phone within reach;with bed alarm set;with family/visitor present Nurse Communication: Mobility status PT Visit Diagnosis: Muscle weakness (generalized) (M62.81);Difficulty in walking, not elsewhere classified (R26.2)    Time: 0768-0881 PT Time Calculation (min) (ACUTE ONLY): 51 min   Charges:        Lurena Joiner B. Sheilia Reznick, PT, DPT [phone]    PT Evaluation $PT Eval Moderate Complexity: 1 Mod PT Treatments $Therapeutic Activity: 23-37 mins   12/05/2017, 5:56 PM

## 2017-12-05 NOTE — Evaluation (Addendum)
Clinical/Bedside Swallow Evaluation Patient Details  Name: Todd Sullivan MRN: [id number] Date of Birth: [birth year]  Today's Date: 12/05/2017 Time: SLP Start Time (ACUTE ONLY): 0941 SLP Stop Time (ACUTE ONLY): 0952 SLP Time Calculation (min) (ACUTE ONLY): 11 min  Past Medical History:  Past Medical History:  Diagnosis Date  . Anxiety   . Asthma   . CHF (congestive heart failure) (HCC)   . COPD (chronic obstructive pulmonary disease) (HCC)   . Depression   . Diabetes mellitus   . Gout   . Headache(784.0)   . Hypertension   . Shortness of breath   . Stroke [facility])    Past Surgical History:  Past Surgical History:  Procedure Laterality Date  . CORONARY/GRAFT ACUTE MI REVASCULARIZATION N/A 07/06/2017   Procedure: Coronary/Graft Acute MI Revascularization;  Surgeon: Elder Negus, MD;  Location: [facility] INVASIVE CV LAB;  Service: Cardiovascular;  Laterality: N/A;  . EYE SURGERY  62 years old  . LEFT HEART CATH AND CORONARY ANGIOGRAPHY N/A 07/06/2017   Procedure: LEFT HEART CATH AND CORONARY ANGIOGRAPHY;  Surgeon: Elder Negus, MD;  Location: [facility] INVASIVE CV LAB;  Service: Cardiovascular;  Laterality: N/A;   HPI:  62 year old male with past medical history significant for COPD, asthma, diabetes, CAD status post STEMI/ stent 06/2017, hypertension, and CVA who presented via EMS with complaints of shortness of breath and altered mental status on 2/12. Intubated on arrival secondary to inability to protect airway. Extubated 2/25 upon pt wishes. CXR improving from admission, now showing There is a degree of pulmonary vascular congestion. Cardiomegaly is stable. Small left pleural effusion. Mild bibasilar atelectasis. No consolidation. Central catheter tips in superior vena cava. No pneumothorax.    Assessment / Plan / Recommendation Clinical Impression    Pt presents with increased risk of aspiration given length of intubation, fluctuating respiratory rate/O2 sat (RR UP TO 40, SpO2 as  low as 86%), and delayed/immediate throat clear with minimal ice chip trial. Pt was consistently throat clearing/coughing, immediately and delayed throughout trials, concerning for aspiration with reduced sensation. Oral phase is overtly functional. Given signs concerning for aspiration and unstable respirations, recommend continuing NPO with medications administered via alternative means, and allowing a few ice chips after oral care with full staff supervision to facilitate use of his swallowing musculature. Pt will be more likely appropriate to begin diet when pt's respiratory status stabilized and with increased time post extubation.   SLP Visit Diagnosis: Dysphagia, oropharyngeal phase (R13.12)    Aspiration Risk  Moderate aspiration risk    Diet Recommendation NPO;Ice chips PRN after oral care   Medication Administration: Via alternative means    Other  Recommendations Oral Care Recommendations: Oral care QID;Oral care prior to ice chip/H20 Other Recommendations: Remove water pitcher   Follow up Recommendations Other (comment)(tbd)      Frequency and Duration min 2x/week  2 weeks       Prognosis Prognosis for Safe Diet Advancement: Fair Barriers to Reach Goals: Severity of deficits;Other (Comment)(length of intubation)      Swallow Study   General HPI: 62 year old male with past medical history significant for COPD, asthma, diabetes, CAD status post STEMI/ stent 06/2017, hypertension, and CVA who presented via EMS with complaints of shortness of breath and altered mental status on 2/12. Intubated on arrival secondary to inability to protect airway. Extubated 2/25 upon pt wishes. CXR improving from admission, now showing There is a degree of pulmonary vascular congestion. Cardiomegaly is stable. Small left pleural effusion. Mild bibasilar atelectasis. No  consolidation. Central catheter tips in superior vena cava. No pneumothorax.  Type of Study: Bedside Swallow Evaluation Previous  Swallow Assessment: none in chart Diet Prior to this Study: NPO Temperature Spikes Noted: No Respiratory Status: Nasal cannula;Increased respiratory rate(fluctuating into the 40s) History of Recent Intubation: Yes Length of Intubations (days): 14 days Date extubated: 12/04/17 Behavior/Cognition: Alert;Cooperative;Requires cueing Oral Cavity Assessment: Within Functional Limits Oral Care Completed by SLP: No Oral Cavity - Dentition: Adequate natural dentition Patient Positioning: Upright in bed Baseline Vocal Quality: Hoarse;Low vocal intensity Volitional Swallow: Unable to elicit    Oral/Motor/Sensory Function Overall Oral Motor/Sensory Function: Within functional limits   Ice Chips Ice chips: Impaired Presentation: Spoon Pharyngeal Phase Impairments: Throat Clearing - Delayed;Cough - Delayed;Throat Clearing - Immediate;Wet Vocal Quality;Change in Vital Signs   Thin Liquid Thin Liquid: Not tested    Nectar Thick Nectar Thick Liquid: Not tested   Honey Thick Honey Thick Liquid: Not tested   Puree Puree: Not tested   Solid   GO   Solid: Not tested        Todd Sullivan 12/05/2017,11:20 AM   Todd Sullivan SLP Student Clinician

## 2017-12-05 NOTE — Progress Notes (Signed)
Nutrition Follow-up  DOCUMENTATION CODES:   Obesity unspecified  INTERVENTION:    Monitor for diet advancement/PO intake and add PO supplements as indicated.   NUTRITION DIAGNOSIS:   Inadequate oral intake related to inability to eat as evidenced by NPO status.  Ongong  GOAL:   Patient will meet greater than or equal to 90% of their needs  Unmet  MONITOR:   Diet advancement, PO intake  ASSESSMENT:   62 yo Male with PMH significant for COPD, asthma, diabetes, CAD status post STEMI/ stent 06/2017, hypertension, and CVA who presented via EMS with complaints of shortness of breath and altered mental status.  Discussed patient with RN today. Extubated 2/25. TF off since extubation. SLP following for ability to begin PO diet; currently not ready for diet advancement, so patient remains NPO. TF off since extubation. Labs and medications reviewed. I/O -5 L since admission.  Diet Order:  Diet NPO time specified  EDUCATION NEEDS:   Not appropriate for education at this time  Skin:  Skin Assessment: Skin Integrity Issues: Skin Integrity Issues:: Stage II, Other (Comment) Stage II: R buttocks Other: MASD to groin  Last BM:  2/25  Height:   Ht Readings from Last 1 Encounters:  11/27/17 6\' 3"  (1.905 m)    Weight:   Wt Readings from Last 1 Encounters:  12/05/17 291 lb 0.1 oz (132 kg)    Ideal Body Weight:  89 kg  BMI:  Body mass index is 36.37 kg/m.  Estimated Nutritional Needs:   Kcal:  2200-2400  Protein:  130-145 gm   Fluid:  2.2-2.4 L    Joaquin Courts, RD, LDN, CNSC Pager [phone] After Hours Pager [phone]

## 2017-12-05 NOTE — Care Management Note (Addendum)
Case Management Note  Patient Details  Name: Jaeger Arneson MRN: [id number] Date of Birth: 1956/06/17  Subjective/Objective:  Admitted with severe encephalopathy                  Action/Plan:   PTA independent from home - however has experienced decline for last couple of months.  Pt will need PT eval once extubated    Expected Discharge Date:                  Expected Discharge Plan:  Home/Self Care  In-House Referral:     Discharge planning Services  CM Consult  Post Acute Care Choice:    Choice offered to:     DME Arranged:    DME Agency:     HH Arranged:    HH Agency:     Status of Service:     If discussed at [employer], dates discussed:    Additional Comments: 12/05/2017  Pt extubated yesterday - remains confused.  PT eval ordered. Pt states he is from home with his girlfriend and confirmed that he fell prior to admit.  Pt requested CM contact girlfriend.  Pt states he has PCP.  CM informed by Neldon Mc that he is on 5  liters home oxygen - SO to follow up with CM regarding oxygen company, pt has portable tanks for transport home.  Pt also has HH aide for approximately 1 hour a day to assist with ADLs per SO  11/30/17 Pt remains  on ventilator on Precedex drip  with poor prognosis.  GOC ongoing Cherylann Parr, RN 12/05/2017, 2:11 PM

## 2017-12-05 NOTE — Progress Notes (Signed)
Rehab Admissions Coordinator Note:  Patient was screened by Todd Sullivan for appropriateness for an Inpatient Acute Rehab Consult per PT recommendation.  At this time, we are recommending Inpatient Rehab consult once pt up a little more with therapy. Please advise.  Todd Sullivan 12/05/2017, 7:59 PM  I can be reached at [phone].

## 2017-12-06 DIAGNOSIS — G92 Toxic encephalopathy: Secondary | ICD-10-CM

## 2017-12-06 DIAGNOSIS — I5023 Acute on chronic systolic (congestive) heart failure: Secondary | ICD-10-CM

## 2017-12-06 DIAGNOSIS — I428 Other cardiomyopathies: Secondary | ICD-10-CM

## 2017-12-06 DIAGNOSIS — E870 Hyperosmolality and hypernatremia: Secondary | ICD-10-CM

## 2017-12-06 LAB — HEPARIN LEVEL (UNFRACTIONATED): Heparin Unfractionated: 0.45 IU/mL (ref 0.30–0.70)

## 2017-12-06 LAB — MAGNESIUM: Magnesium: 1.9 mg/dL (ref 1.7–2.4)

## 2017-12-06 LAB — BASIC METABOLIC PANEL
Anion gap: 11 (ref 5–15)
BUN: 43 mg/dL — ABNORMAL HIGH (ref 6–20)
CALCIUM: 8.9 mg/dL (ref 8.9–10.3)
CHLORIDE: 109 mmol/L (ref 101–111)
CO2: 29 mmol/L (ref 22–32)
CREATININE: 1.16 mg/dL (ref 0.61–1.24)
GFR calc Af Amer: >60 mL/min (ref >60)
GFR calc non Af Amer: >60 mL/min (ref >60)
GLUCOSE: 101 mg/dL — AB (ref 65–99)
Potassium: 4.3 mmol/L (ref 3.5–5.1)
Sodium: 149 mmol/L — ABNORMAL HIGH (ref 135–145)

## 2017-12-06 LAB — CBC
HEMATOCRIT: 36.2 % — AB (ref 39.0–52.0)
HEMOGLOBIN: 10.8 g/dL — AB (ref 13.0–17.0)
MCH: 25.3 pg — ABNORMAL LOW (ref 26.0–34.0)
MCHC: 29.8 g/dL — AB (ref 30.0–36.0)
MCV: 84.8 fL (ref 78.0–100.0)
Platelets: 408 10*3/uL — ABNORMAL HIGH (ref 150–400)
RBC: 4.27 MIL/uL (ref 4.22–5.81)
RDW: 23 % — ABNORMAL HIGH (ref 11.5–15.5)
WBC: 8.1 10*3/uL (ref 4.0–10.5)

## 2017-12-06 LAB — GLUCOSE, CAPILLARY
GLUCOSE-CAPILLARY: 107 mg/dL — AB (ref 65–99)
GLUCOSE-CAPILLARY: 111 mg/dL — AB (ref 65–99)
GLUCOSE-CAPILLARY: 127 mg/dL — AB (ref 65–99)
GLUCOSE-CAPILLARY: 96 mg/dL (ref 65–99)
Glucose-Capillary: 119 mg/dL — ABNORMAL HIGH (ref 65–99)
Glucose-Capillary: 134 mg/dL — ABNORMAL HIGH (ref 65–99)

## 2017-12-06 LAB — PHOSPHORUS: Phosphorus: 3.4 mg/dL (ref 2.5–4.6)

## 2017-12-06 LAB — CULTURE, RESPIRATORY W GRAM STAIN: Culture: NORMAL

## 2017-12-06 LAB — CULTURE, RESPIRATORY

## 2017-12-06 LAB — OSMOLALITY: Osmolality: 318 mOsm/kg — ABNORMAL HIGH (ref 275–295)

## 2017-12-06 MED ORDER — WARFARIN - PHARMACIST DOSING INPATIENT
Freq: Every day | Status: DC
  Administered 2017-12-07 – 2017-12-08 (×2)

## 2017-12-06 MED ORDER — WARFARIN SODIUM 5 MG PO TABS
10.0000 mg | ORAL_TABLET | Freq: Once | ORAL | Status: AC
  Administered 2017-12-06: 10 mg via ORAL
  Filled 2017-12-06: qty 2

## 2017-12-06 MED ORDER — FUROSEMIDE 10 MG/ML IJ SOLN
40.0000 mg | Freq: Two times a day (BID) | INTRAMUSCULAR | Status: DC
  Administered 2017-12-06 – 2017-12-07 (×2): 40 mg via INTRAVENOUS
  Filled 2017-12-06 (×2): qty 4

## 2017-12-06 MED ORDER — RESOURCE THICKENUP CLEAR PO POWD
ORAL | Status: DC | PRN
  Filled 2017-12-06: qty 125

## 2017-12-06 NOTE — Progress Notes (Signed)
  Speech Language Pathology Treatment: Dysphagia  Patient Details Name: Todd Sullivan MRN: [id number] DOB: 1956/04/25 Today's Date: 12/06/2017 Time: 3888-7579 SLP Time Calculation (min) (ACUTE ONLY): 11 min  Assessment / Plan / Recommendation Clinical Impression  Swallowing function appearing to be improving today with ability to consume diagnostic po trials of pureed solids and thin liquids without overt indication of aspiration, moderate-max cues for aspiration precautions given continued confusion, decreased attention. Multiple rapid swallows, particularly with liquids, raises concern for possible delayed swallow initiation and/or pharyngeal residue, likely residual dysphagia s/p prolonged intubation. Given risk of silent aspiration in this population, recommend proceeding with FEES to evaluate for potential aspiration and determine least restrictive diet.    HPI HPI: 62 year old male with past medical history significant for COPD, asthma, diabetes, CAD status post STEMI/ stent 06/2017, hypertension, and CVA who presented via EMS with complaints of shortness of breath and altered mental status on 2/12. Intubated on arrival secondary to inability to protect airway. Extubated 2/25 upon pt wishes. CXR improving from admission, now showing There is a degree of pulmonary vascular congestion. Cardiomegaly is stable. Small left pleural effusion. Mild bibasilar atelectasis. No consolidation. Central catheter tips in superior vena cava. No pneumothorax.       SLP Plan  (FEES)       Recommendations  Diet recommendations: NPO Medication Administration: Via alternative means                Oral Care Recommendations: Oral care QID Follow up Recommendations: (TBD) SLP Visit Diagnosis: Dysphagia, oropharyngeal phase (R13.12) Plan: (FEES)       GO                Aleiah Mohammed Meryl 12/06/2017, 9:21 AM

## 2017-12-06 NOTE — Progress Notes (Signed)
Rt attempted to drop Oxygen to 3 L/min. Pt. Started to have increased WOB. Pt was placed back on 5 L/min.

## 2017-12-06 NOTE — Progress Notes (Addendum)
ANTICOAGULATION CONSULT NOTE   Pharmacy Consult for Heparin Indication: new apical thrombus  Allergies  Allergen Reactions  . Banana     Sick   . Chlorhexidine Gluconate     Caused contact dermatitis on skin  . Egg Yolk Nausea And Vomiting  . Kiwi Extract Nausea And Vomiting    Patient Measurements: Height: 6\' 3"  (190.5 cm) Weight: 292 lb 1.8 oz (132.5 kg) IBW/kg (Calculated) : 84.5 Heparin Dosing Weight: 116 kg  Vital Signs: Temp: 98 F (36.7 C) (02/27 0541) Temp Source: Axillary (02/27 0541) BP: 129/73 (02/27 0541) Pulse Rate: 102 (02/27 0541)  Labs: Recent Labs    12/04/17 0400  12/04/17 1947 12/05/17 0427 12/05/17 0428 12/06/17 0334  HGB 11.6*  --   --   --  11.8* 10.8*  HCT 37.5*  --   --   --  37.9* 36.2*  PLT 220  --   --   --  292 408*  HEPARINUNFRC 0.28*   < > 0.55 0.46  --  0.45  CREATININE 1.24  --   --   --  1.17 1.16   < > = values in this interval not displayed.    Estimated Creatinine Clearance: 99.3 mL/min (by C-G formula based on SCr of 1.16 mg/dL).  Assessment: 62 y.o. male with CHF and apical thrombus who continues on IV heparin.  Heparin level this morning remains therapeutic at 0.45units/mL on 1700 units/hr. CBC is overall stable (platelets elevated today), no signs of bleeding have been noted.   Goal of Therapy:  Heparin level 0.3-0.7 units/ml Monitor platelets by anticoagulation protocol: Yes   Plan:  Continue heparin at 1700 units/hr Daily heparin level/CBC Monitor for signs/symptoms of bleeding  F/U transition to warfarin  Lauren D. Bajbus, PharmD, BCPS Clinical Pharmacist Clinical Phone for 12/06/2017 until 3:30pm: [phone] If after 3:30pm, please call main pharmacy at [phone] 12/06/2017 9:38 AM   PM: Adding warfarin -- 10 mg po x 1 then daily INR  Thank you Okey Regal, PharmD [phone]

## 2017-12-06 NOTE — Progress Notes (Signed)
PROGRESS NOTE   Todd Sullivan  WUJ:[id number]    DOB: 1956-03-04    DOA: 12/06/2017  PCP: Leilani Able, MD   I have briefly reviewed patients previous medical records in [facility].  Brief Narrative:  CCM to [facility] transfer 12/06/17. 62 year old male with PMH of COPD, asthma, DM, CAD status post STEMI/stent 06/2017, HTN, CVA who presented via EMS with complaints of dyspnea and altered mental status.  He apparently had not done well since his MI 3 months ago.  He had not taken his medications for 5 days PTA.  He was found after a fall out of bed.  In ED, unable to protect airway and hence intubated.  He was also hypoglycemic and placed on D10 drip.  He was admitted to ICU by CCM.  Extubated 2/26.   Assessment & Plan:   Active Problems:   Acute decompensated heart failure (HCC)   Respiratory failure (HCC)   Altered mental status   Pressure injury of skin   Acute respiratory failure with hypoxia: Unable to protect airway in the ED and was intubated right away.  Managed by CCM in ICU.  Probably due to cardiogenic pulmonary edema and community-acquired pneumonia.  Extubated 2/26.  Currently saturating 100% on 5 L/min [place] oxygen.  Wean as tolerated.  Probable community-acquired pneumonia, left lower lobe: Completed c 7 days of antibiotic therapy.  Tracheal aspirate: Normal respiratory flora.  Acute on chronic systolic CHF/nonischemic cardiomyopathy: Cardiology evaluated and signed off.  TTE: LVEF 10-15%, diffuse hypokinesis and diastolic dysfunction cannot be evaluated.  Continue carvedilol 3.125 mg twice daily, captopril 6.25 mg 3 times daily, BiDil.  Clinically volume overloaded.  Currently not on Lasix.  Discussed with cardiology who will reassess her either later today or tomorrow but advised to continue Lasix despite the hyponatremia due to volume overload status.  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  No significant disease in other vessels.  Cardiology did not suspect NSTEMI.  Continue carvedilol, captopril.  Also on Plavix.  Mural thrombus: Presently on IV heparin.  Initiate Coumadin per pharmacy.  Minimal hematuria noted, monitor closely.   Obesity/Body mass index is 36.51 kg/m.  Acute toxic metabolic encephalopathy: Unclear as to his baseline mental status.  CT head 11/11/2017: No acute intracranial abnormalities.  ABG 12/04/17 without CO2 retention.  No focal deficits on clinical exam. Minimize sedative medications and monitor closely.  Recent AKI and hypernatremia.  As per discussion with CCM today, patient was somnolent with waxing and waning alertness even yesterday and this does not appear to be new.  CCM also indicated that they had extensive discussion with patient's family regarding overall poor prognosis and patient had clarified that he did not want aggressive measures including intubation or tracheostomy.  Acute renal failure: Presumed cardiorenal syndrome.  Resolved.  Hypernatremia: Unclear etiology.  Has been stable in the 148-150 range for the last week or so.  Check serum osmolarity.  Follow BMP while on Lasix.  Encourage oral free water intake.  Transaminitis:?  Related to hepatic congestion.  Hepatitis panel negative.  RUQ ultrasound without evidence of acute cholecystitis.  Suspected shock liver.  Follow CMP in a.m.  Constipation: Had large BM on 2/22.  Continue bowel regimen.  Type II DM: A1c 6.5.  On SSI.  Controlled.  Oral diabetics on hold.  Cocaine abuse: UDS positive for cocaine.  Normocytic anemia: Relatively stable.  Follow CBCs closely.  DVT prophylaxis: Currently on IV heparin drip.  Coumadin per pharmacy initiated. Code Status: DNR.  Confirmed with CCM MD. Family Communication: None at bedside. Disposition: To be determined.   Consultants:  CCM-signed off 2/26 Cardiology  Procedures:  Foley catheter RUE PICC  line Extubated 2/26  Antimicrobials:  Completed course   Subjective: Waxing and waning mental status.  Oriented to self and partly to place.  States that he did "crack Ree Kida" before hospitalization.  Denies dyspnea or chest pain.  Discussed with nursing.  ROS: As above.  Objective:  Vitals:   12/06/17 0541 12/06/17 0823 12/06/17 1300 12/06/17 1359  BP: 129/73   127/87  Pulse: (!) 102   (!) 101  Resp: 16   20  Temp: 98 F (36.7 C)   98.2 F (36.8 C)  TempSrc: Axillary   Oral  SpO2: 98% 99% 100% 100%  Weight: 132.5 kg (292 lb 1.8 oz)     Height:        Examination:  General exam: Middle-aged male, moderately built and obese, sitting up comfortably in bed.  Unkempt.  Body odor +. Respiratory system: Reduced breath sounds in the bases with scattered few bibasilar crackles.  Rest of lung fields clear to auscultation. Respiratory effort normal. Cardiovascular system: S1 & S2 heard, RRR. No JVD, murmurs, rubs, gallops or clicks.  2+ pitting pedal edema extending up to thighs.  Also has trace bilateral upper extremity edema. Gastrointestinal system: Abdomen is nondistended, soft and nontender. No organomegaly or masses felt. Normal bowel sounds heard. Central nervous system: Waxing and waning mental status.  Oriented to self and partly to place. No focal neurological deficits. Extremities: Moves all extremities symmetrically. Skin: No rashes, lesions or ulcers Psychiatry: Judgement and insight impaired. Mood & affect cannot be assessed..     Data Reviewed: I have personally reviewed following labs and imaging studies  CBC: Recent Labs  Lab 12/02/17 0357 12/03/17 0417 12/04/17 0400 12/05/17 0428 12/06/17 0334  WBC 10.6* 10.7* 9.5 10.3 8.1  NEUTROABS  --   --  4.7  --   --   HGB 12.0* 11.9* 11.6* 11.8* 10.8*  HCT 37.9* 37.7* 37.5* 37.9* 36.2*  MCV 81.7 82.9 82.6 82.9 84.8  PLT 205 206 220 292 408*   Basic Metabolic Panel: Recent Labs  Lab 12/02/17 0357 12/03/17 0417  12/04/17 0400 12/05/17 0428 12/06/17 0334  NA 147* 149* 150* 149* 149*  K 4.0 3.7 3.7 3.3* 4.3  CL 109 111 113* 108 109  CO2 27 27 28 28 29   GLUCOSE 149* 154* 138* 87 101*  BUN 66* 71* 74* 55* 43*  CREATININE 1.45* 1.29* 1.24 1.17 1.16  CALCIUM 8.6* 8.6* 8.6* 8.9 8.9  MG  --   --  2.1 1.9 1.9  PHOS  --   --   --  3.7 3.4   Liver Function Tests: Recent Labs  Lab 11/30/17 0331  AST 59*  ALT 116*  ALKPHOS 109  BILITOT 2.2*  PROT 6.0*  ALBUMIN 2.4*   Coagulation Profile: No results for input(s): INR, PROTIME in the last 168 hours. Cardiac Enzymes: No results for input(s): CKTOTAL, CKMB, CKMBINDEX, TROPONINI in the last 168 hours. HbA1C: No results for input(s): HGBA1C in the last 72 hours. CBG: Recent Labs  Lab 12/05/17 2026 12/06/17 0016 12/06/17 0402 12/06/17 0810 12/06/17 1211  GLUCAP 81 107* 96 111* 119*    Recent Results (from the past 240 hour(s))  Culture, respiratory (NON-Expectorated)     Status: None  Collection Time: 12/03/17  1:35 PM  Result Value Ref Range Status   Specimen Description TRACHEAL ASPIRATE  Final   Special Requests NONE  Final   Gram Stain   Final    RARE WBC PRESENT, PREDOMINANTLY MONONUCLEAR RARE SQUAMOUS EPITHELIAL CELLS PRESENT NO ORGANISMS SEEN    Culture   Final    FEW Consistent with normal respiratory flora. Performed at [facility], [address], [place], [place] [address]    Report Status 12/06/2017 FINAL  Final         Radiology Studies: Dg Chest Port 1 View  Result Date: 12/05/2017 CLINICAL DATA:  Status postextubation EXAM: PORTABLE CHEST 1 VIEW COMPARISON:  December 04, 2017 FINDINGS: Right jugular and right subclavian catheter tips are in the superior vena cava. Nasogastric tube and endotracheal tube have been removed. No pneumothorax. There is a small left pleural effusion. There is mild bibasilar atelectatic change. No frank edema or consolidation. There is cardiomegaly with a degree of pulmonary  venous hypertension. IMPRESSION: There is a degree of pulmonary vascular congestion. Cardiomegaly is stable. Small left pleural effusion. Mild bibasilar atelectasis. No consolidation. Central catheter tips in superior vena cava. No pneumothorax. Electronically Signed   By: Bretta Bang III M.D.   On: 12/05/2017 07:31        Scheduled Meds: . captopril  6.25 mg Oral TID  . carvedilol  3.125 mg Oral BID WC  . clopidogrel  75 mg Oral Daily  . docusate sodium  100 mg Oral Daily  . insulin aspart  0-9 Units Subcutaneous Q4H  . isosorbide-hydrALAZINE  1 tablet Oral TID  . senna  1 tablet Oral Daily  . sodium chloride flush  10-40 mL Intracatheter Q12H  . sodium chloride flush  10-40 mL Intracatheter Q12H   Continuous Infusions: . sodium chloride 250 mL (12/02/17 0700)  . heparin 1,700 Units/hr (12/05/17 1100)     LOS: 15 days     Marcellus Scott, MD, FACP, [facility]. [facility] Pager [phone]  If 7PM-7AM, please contact night-coverage [email or web address] Password [facility] 12/06/2017, 2:57 PM

## 2017-12-06 NOTE — Procedures (Signed)
Objective Swallowing Evaluation: Type of Study: FEES-Fiberoptic Endoscopic Evaluation of Swallow   Patient Details  Name: Todd Sullivan MRN: [id number] Date of Birth: Aug 03, 1956  Today's Date: 12/06/2017 Time: SLP Start Time (ACUTE ONLY): 1105 -SLP Stop Time (ACUTE ONLY): 1140  SLP Time Calculation (min) (ACUTE ONLY): 35 min   Past Medical History:  Past Medical History:  Diagnosis Date  . Anxiety   . Asthma   . CHF (congestive heart failure) (HCC)   . COPD (chronic obstructive pulmonary disease) (HCC)   . Depression   . Diabetes mellitus   . Gout   . Headache(784.0)   . Hypertension   . Shortness of breath   . Stroke [facility])    Past Surgical History:  Past Surgical History:  Procedure Laterality Date  . CORONARY/GRAFT ACUTE MI REVASCULARIZATION N/A 07/06/2017   Procedure: Coronary/Graft Acute MI Revascularization;  Surgeon: Elder Negus, MD;  Location: [facility] INVASIVE CV LAB;  Service: Cardiovascular;  Laterality: N/A;  . EYE SURGERY  62 years old  . LEFT HEART CATH AND CORONARY ANGIOGRAPHY N/A 07/06/2017   Procedure: LEFT HEART CATH AND CORONARY ANGIOGRAPHY;  Surgeon: Elder Negus, MD;  Location: [facility] INVASIVE CV LAB;  Service: Cardiovascular;  Laterality: N/A;   HPI: 62 year old male with past medical history significant for COPD, asthma, diabetes, CAD status post STEMI/ stent 06/2017, hypertension, and CVA who presented via EMS with complaints of shortness of breath and altered mental status on 2/12. Intubated on arrival secondary to inability to protect airway. Extubated 2/25 upon pt wishes. CXR improving from admission, now showing There is a degree of pulmonary vascular congestion. Cardiomegaly is stable. Small left pleural effusion. Mild bibasilar atelectasis. No consolidation. Central catheter tips in superior vena cava. No pneumothorax.    Subjective: pt alert, mumbling, not following commands consistently but wants POs    Assessment / Plan /  Recommendation  CHL IP CLINICAL IMPRESSIONS 12/06/2017  Clinical Impression Pt has a mild oropharyngeal dysphagia that is likely secondary to altered mentation, recent intubation, and reduced UES clearance. Pt has an exceresence on the anterior portion of his laryngeal vestibule as well as bilateral ulcerations on the posterior third of his true vocal folds, suggestive of where the ETT was likely positioned. He has intermittent penetration with thin liquids, melted from ice chips and administered by cup/straw. With larger boluses this occurs during the swallow from reduced coordination with airway closure. His coordination improves with smaller boluses, but he has penetration after the swallow due to backflow from the UES that spills in between his arytenoids. With all episodes of penetration, pt has a spontaneous cough response that clears his airway well. No airway invasion is observed with nectar thick liquids or solids, although his oral preparation is prolonged with solid foods, with SLP providing Min-Mod cues to swallow. Given the above as well as pt's fluctuating mentation, recommend starting more conservatively with Dys 2 diet and nectar thick liquids; however, anticipate quick progression of solids/liquids clinically as his mentation improves.  SLP Visit Diagnosis Dysphagia, oropharyngeal phase (R13.12)  Attention and concentration deficit following --  Frontal lobe and executive function deficit following --  Impact on safety and function Mild aspiration risk      CHL IP TREATMENT RECOMMENDATION 12/06/2017  Treatment Recommendations Therapy as outlined in treatment plan below     Prognosis 12/06/2017  Prognosis for Safe Diet Advancement Good  Barriers to Reach Goals Cognitive deficits  Barriers/Prognosis Comment --    CHL IP DIET RECOMMENDATION 12/06/2017  SLP  Diet Recommendations Dysphagia 2 (Fine chop) solids;Nectar thick liquid  Liquid Administration via Cup;Straw  Medication  Administration Whole meds with puree  Compensations Minimize environmental distractions;Slow rate;Small sips/bites  Postural Changes Remain semi-upright after after feeds/meals (Comment);Seated upright at 90 degrees      CHL IP OTHER RECOMMENDATIONS 12/06/2017  Recommended Consults --  Oral Care Recommendations Oral care BID  Other Recommendations Order thickener from pharmacy;Prohibited food (jello, ice cream, thin soups);Remove water pitcher      CHL IP FOLLOW UP RECOMMENDATIONS 12/06/2017  Follow up Recommendations 24 hour supervision/assistance      CHL IP FREQUENCY AND DURATION 12/06/2017  Speech Therapy Frequency (ACUTE ONLY) min 2x/week  Treatment Duration 2 weeks           CHL IP ORAL PHASE 12/06/2017  Oral Phase Impaired  Oral - Pudding Teaspoon --  Oral - Pudding Cup --  Oral - Honey Teaspoon --  Oral - Honey Cup --  Oral - Nectar Teaspoon --  Oral - Nectar Cup WFL  Oral - Nectar Straw WFL  Oral - Thin Teaspoon WFL;Other (Comment)  Oral - Thin Cup WFL  Oral - Thin Straw WFL  Oral - Puree Delayed oral transit  Oral - Mech Soft Delayed oral transit;Impaired mastication  Oral - Regular --  Oral - Multi-Consistency --  Oral - Pill --  Oral Phase - Comment --    CHL IP PHARYNGEAL PHASE 12/06/2017  Pharyngeal Phase Impaired  Pharyngeal- Pudding Teaspoon --  Pharyngeal --  Pharyngeal- Pudding Cup --  Pharyngeal --  Pharyngeal- Honey Teaspoon --  Pharyngeal --  Pharyngeal- Honey Cup --  Pharyngeal --  Pharyngeal- Nectar Teaspoon --  Pharyngeal --  Pharyngeal- Nectar Cup Pharyngeal residue - cp segment  Pharyngeal --  Pharyngeal- Nectar Straw Pharyngeal residue - cp segment  Pharyngeal --  Pharyngeal- Thin Teaspoon Pharyngeal residue - cp segment;Penetration/Apiration after swallow;Inter-arytenoid space residue  Pharyngeal Material enters airway, CONTACTS cords and then ejected out  Pharyngeal- Thin Cup Penetration/Apiration after swallow;Pharyngeal residue - cp  segment  Pharyngeal Material enters airway, remains ABOVE vocal cords then ejected out  Pharyngeal- Thin Straw Delayed swallow initiation-pyriform sinuses;Penetration/Aspiration during swallow;Penetration/Apiration after swallow;Pharyngeal residue - cp segment  Pharyngeal Material enters airway, CONTACTS cords and not ejected out  Pharyngeal- Puree WFL  Pharyngeal --  Pharyngeal- Mechanical Soft WFL  Pharyngeal --  Pharyngeal- Regular --  Pharyngeal --  Pharyngeal- Multi-consistency --  Pharyngeal --  Pharyngeal- Pill --  Pharyngeal --  Pharyngeal Comment --     CHL IP CERVICAL ESOPHAGEAL PHASE 12/06/2017  Cervical Esophageal Phase Impaired  Pudding Teaspoon --  Pudding Cup --  Honey Teaspoon --  Honey Cup --  Nectar Teaspoon --  Nectar Cup Esophageal backflow into the pharynx  Nectar Straw Esophageal backflow into the pharynx  Thin Teaspoon Esophageal backflow into the pharynx;Esophageal backflow into the larynx  Thin Cup Esophageal backflow into the pharynx;Esophageal backflow into the larynx  Thin Straw Esophageal backflow into the pharynx;Esophageal backflow into the larynx  Puree [facility]  Mechanical Soft WFL  Regular --  Multi-consistency --  Pill --  Cervical Esophageal Comment --    No flowsheet data found.  Maxcine Ham 12/06/2017, 2:03 PM   Maxcine Ham, M.A. CCC-SLP [phone]

## 2017-12-06 NOTE — Progress Notes (Signed)
Patient placed in low bed per high fall risk protocol.

## 2017-12-06 NOTE — Evaluation (Signed)
Occupational Therapy Evaluation Patient Details Name: Todd Sullivan MRN: [id number] DOB: 19-Oct-1955 Today's Date: 12/06/2017    History of Present Illness 62 y.o. male admitted on 11/23/2017 for SOB.  He was intubated in the ED and extubated on 12/04/17.  At the time of PT evaluation he was on 10-15 L of O2 via HFNC.  Pt dx with probably L LL CAP, and toxic metabolic encepholopathy.  Pt with pulmonary edema and pleural effusion started on lasix.  Other significant PMH includes stroke, HTN, gout, DM, COPD, CHF, asthma, anxiety,  MI s/p revasularization, and EF of 10%.     Clinical Impression   PTA, pt was independent with cane for basic ADL and functional mobility. Per chart, he does have an aide to assist with heavier housework. Pt received working with nursing staff for pericare in bed. He was able to complete functional reaching tasks in preparation for ADL participation this session as well as multiple grooming tasks on command with min assist. He additionally demonstrated intellectual awareness and decreased problem solving skills but was able to engage with tasks with multisensory stimulation. Pt benefited from listening to jazz music during session and was clapping and attempting to dance becoming much more alert. Feel he is an excellent candidate for CIR level therapies post-acute D/C to maximize return to PLOF.     Follow Up Recommendations  CIR;Supervision/Assistance - 24 hour    Equipment Recommendations  Other (comment)(TBD at next venue of care)    Recommendations for Other Services Rehab consult     Precautions / Restrictions Precautions Precautions: Fall;Other (comment) Precaution Comments: monitor vitals closely Restrictions Weight Bearing Restrictions: No      Mobility Bed Mobility Overal bed mobility: Needs Assistance Bed Mobility: Rolling Rolling: Mod assist;+2 for physical assistance;Max assist         General bed mobility comments: Mod assist to roll to the R;  max assist to roll to the L.   Transfers                      Balance                                           ADL either performed or assessed with clinical judgement   ADL Overall ADL's : Needs assistance/impaired Eating/Feeding: NPO   Grooming: Minimal assistance;Bed level;Applying deodorant;Wash/dry face Grooming Details (indicate cue type and reason): Followed commands to wash face and apply deoderant. Assist as pt attempting to apply deoderant over his gown.  Upper Body Bathing: Moderate assistance;Bed level   Lower Body Bathing: Maximal assistance;Bed level   Upper Body Dressing : Moderate assistance;Bed level   Lower Body Dressing: Maximal assistance;Bed level       Toileting- Clothing Manipulation and Hygiene: Total assistance;Bed level         General ADL Comments: Pt received completing pericare with mobility tech and nurse tech. Required assistance to roll bilaterally. Seated in bed with HOB elevated, pt was able to apply deoderant and wash his face on command.      Vision   Additional Comments: R gaze and seems to demonstrate decreased awareness of L side of visual field.      Perception     Praxis      Pertinent Vitals/Pain Pain Assessment: Faces Faces Pain Scale: Hurts little more Pain Location: discomfort when OT assisting to lift R LE  Pain Descriptors / Indicators: Grimacing;Guarding Pain Intervention(s): Limited activity within patient's tolerance;Monitored during session     Hand Dominance     Extremity/Trunk Assessment Upper Extremity Assessment Upper Extremity Assessment: Generalized weakness(more readily utilizes L than R)   Lower Extremity Assessment Lower Extremity Assessment: Generalized weakness   Cervical / Trunk Assessment Cervical / Trunk Assessment: Normal   Communication Communication Communication: Other (comment)(soft spoken; intermittently understandable)   Cognition Arousal/Alertness:  Awake/alert Behavior During Therapy: Restless;Impulsive Overall Cognitive Status: Impaired/Different from baseline Area of Impairment: Problem solving;Awareness;Safety/judgement;Following commands                       Following Commands: Follows one step commands inconsistently(approximately 75% of the time) Safety/Judgement: Decreased awareness of safety;Decreased awareness of deficits Awareness: Intellectual Problem Solving: Slow processing;Decreased initiation;Difficulty sequencing;Requires verbal cues;Requires tactile cues General Comments: Pt speaking non-sensically and tangentially at times but is able to answer direct, simple questions. Followed direct commands with visual cues.    General Comments       Exercises Exercises: Other exercises Other Exercises Other Exercises: Functional reaching tasks with B UE to interact with objects in his room. Able to complete 5 repetitions each UE.    Shoulder Instructions      Home Living Family/patient expects to be discharged to:: Private residence Living Arrangements: Spouse/significant other Available Help at Discharge: Family;Personal care attendant Type of Home: House Home Access: Stairs to enter [employer] of Steps: 4 Entrance Stairs-Rails: (post on the L) Home Layout: One level               Home Equipment: Cane - single point   Additional Comments: Information from chart and PT note; no family present and pt not able to report today. Per PT note, has an aide to assist with heavier housework      Prior Functioning/Environment Level of Independence: Independent with assistive device(s)        Comments: Per chart, used cane for gait. Does bathing and dressing. Needs assist with heavier chores from aide. Per pt he used 2-3 L O2 [place] PRN at home.         OT Problem List: Decreased strength;Decreased range of motion;Decreased activity tolerance;Impaired balance (sitting and/or standing);Impaired  vision/perception;Decreased cognition;Decreased safety awareness;Decreased coordination;Decreased knowledge of use of DME or AE;Decreased knowledge of precautions;Pain      OT Treatment/Interventions: Self-care/ADL training;Therapeutic exercise;Energy conservation;DME and/or AE instruction;Therapeutic activities;Patient/family education;Balance training;Cognitive remediation/compensation    OT Goals(Current goals can be found in the care plan section) Acute Rehab OT Goals Patient Stated Goal: to get better OT Goal Formulation: With patient Time For Goal Achievement: 12/20/17 Potential to Achieve Goals: Good ADL Goals Pt Will Perform Grooming: with min assist;sitting Pt Will Transfer to Toilet: with max assist;with +2 assist;bedside commode Pt Will Perform Toileting - Clothing Manipulation and hygiene: with max assist;sitting/lateral leans Additional ADL Goal #1: Pt will complete bed mobility with overall min assist in preparation for ADL participation seated at EOB. Additional ADL Goal #2: Pt will demonstrate emergent awareness during seated grooming tasks.  OT Frequency: Min 3X/week   Barriers to D/C:            Co-evaluation              AM-PAC PT "6 Clicks" Daily Activity     Outcome Measure Help from another person eating meals?: Total Help from another person taking care of personal grooming?: A Little Help from another person toileting, which includes using toliet, bedpan,  or urinal?: Total Help from another person bathing (including washing, rinsing, drying)?: A Lot Help from another person to put on and taking off regular upper body clothing?: A Lot Help from another person to put on and taking off regular lower body clothing?: Total 6 Click Score: 10   End of Session Equipment Utilized During Treatment: Gait belt;Rolling walker;Oxygen(5L O2) Nurse Communication: Mobility status  Activity Tolerance: Patient tolerated treatment well Patient left: in bed;with call  bell/phone within reach;with bed alarm set  OT Visit Diagnosis: Other abnormalities of gait and mobility (R26.89);Muscle weakness (generalized) (M62.81);Other symptoms and signs involving cognitive function                Time: 1400-1420 OT Time Calculation (min): 20 min Charges:  OT General Charges $OT Visit: 1 Visit OT Evaluation $OT Eval Moderate Complexity: 1 Mod G-Codes:    Doristine Section, MS OTR/L  Pager: [phone]   Mahek Schlesinger A Katalin Colledge 12/06/2017, 4:57 PM

## 2017-12-07 ENCOUNTER — Inpatient Hospital Stay (HOSPITAL_COMMUNITY): Payer: Medicare Other

## 2017-12-07 DIAGNOSIS — I5043 Acute on chronic combined systolic (congestive) and diastolic (congestive) heart failure: Secondary | ICD-10-CM

## 2017-12-07 DIAGNOSIS — R5381 Other malaise: Secondary | ICD-10-CM

## 2017-12-07 LAB — COMPREHENSIVE METABOLIC PANEL
ALK PHOS: 82 U/L (ref 38–126)
ALT: 56 U/L (ref 17–63)
ANION GAP: 11 (ref 5–15)
AST: 28 U/L (ref 15–41)
Albumin: 2.8 g/dL — ABNORMAL LOW (ref 3.5–5.0)
BUN: 28 mg/dL — ABNORMAL HIGH (ref 6–20)
CHLORIDE: 108 mmol/L (ref 101–111)
CO2: 29 mmol/L (ref 22–32)
Calcium: 9.1 mg/dL (ref 8.9–10.3)
Creatinine, Ser: 1.05 mg/dL (ref 0.61–1.24)
GFR calc Af Amer: >60 mL/min (ref >60)
GFR calc non Af Amer: >60 mL/min (ref >60)
GLUCOSE: 114 mg/dL — AB (ref 65–99)
Potassium: 3.8 mmol/L (ref 3.5–5.1)
SODIUM: 148 mmol/L — AB (ref 135–145)
Total Bilirubin: 1.9 mg/dL — ABNORMAL HIGH (ref 0.3–1.2)
Total Protein: 6.7 g/dL (ref 6.5–8.1)

## 2017-12-07 LAB — CBC
HCT: 38.6 % — ABNORMAL LOW (ref 39.0–52.0)
HEMOGLOBIN: 11.8 g/dL — AB (ref 13.0–17.0)
MCH: 25.9 pg — ABNORMAL LOW (ref 26.0–34.0)
MCHC: 30.6 g/dL (ref 30.0–36.0)
MCV: 84.8 fL (ref 78.0–100.0)
PLATELETS: 467 10*3/uL — AB (ref 150–400)
RBC: 4.55 MIL/uL (ref 4.22–5.81)
RDW: 23.2 % — ABNORMAL HIGH (ref 11.5–15.5)
WBC: 9.6 10*3/uL (ref 4.0–10.5)

## 2017-12-07 LAB — GLUCOSE, CAPILLARY
GLUCOSE-CAPILLARY: 105 mg/dL — AB (ref 65–99)
GLUCOSE-CAPILLARY: 107 mg/dL — AB (ref 65–99)
GLUCOSE-CAPILLARY: 124 mg/dL — AB (ref 65–99)
Glucose-Capillary: 129 mg/dL — ABNORMAL HIGH (ref 65–99)
Glucose-Capillary: 152 mg/dL — ABNORMAL HIGH (ref 65–99)
Glucose-Capillary: 140 mg/dL — ABNORMAL HIGH (ref 65–99)
Glucose-Capillary: 106 mg/dL — ABNORMAL HIGH (ref 65–99)

## 2017-12-07 LAB — HEPARIN LEVEL (UNFRACTIONATED)
HEPARIN UNFRACTIONATED: 0.28 [IU]/mL — AB (ref 0.30–0.70)
HEPARIN UNFRACTIONATED: 0.28 [IU]/mL — AB (ref 0.30–0.70)

## 2017-12-07 LAB — PROTIME-INR
INR: 1.56
Prothrombin Time: 18.5 seconds — ABNORMAL HIGH (ref 11.4–15.2)

## 2017-12-07 LAB — SODIUM, URINE, RANDOM: Sodium, Ur: 62 mmol/L

## 2017-12-07 MED ORDER — ADULT MULTIVITAMIN W/MINERALS CH
1.0000 | ORAL_TABLET | Freq: Every day | ORAL | Status: DC
  Administered 2017-12-07 – 2017-12-11 (×5): 1 via ORAL
  Filled 2017-12-07 (×5): qty 1

## 2017-12-07 MED ORDER — IPRATROPIUM-ALBUTEROL 0.5-2.5 (3) MG/3ML IN SOLN
3.0000 mL | Freq: Four times a day (QID) | RESPIRATORY_TRACT | Status: DC
Start: 2017-12-08 — End: 2017-12-09
  Administered 2017-12-08 – 2017-12-09 (×5): 3 mL via RESPIRATORY_TRACT
  Filled 2017-12-07 (×5): qty 3

## 2017-12-07 MED ORDER — ACETAMINOPHEN 325 MG PO TABS
650.0000 mg | ORAL_TABLET | Freq: Four times a day (QID) | ORAL | Status: DC | PRN
  Administered 2017-12-08 – 2017-12-09 (×3): 650 mg via ORAL
  Filled 2017-12-07 (×3): qty 2

## 2017-12-07 MED ORDER — IPRATROPIUM-ALBUTEROL 0.5-2.5 (3) MG/3ML IN SOLN
3.0000 mL | Freq: Four times a day (QID) | RESPIRATORY_TRACT | Status: DC
  Administered 2017-12-07 (×2): 3 mL via RESPIRATORY_TRACT
  Filled 2017-12-07: qty 3

## 2017-12-07 MED ORDER — WARFARIN SODIUM 7.5 MG PO TABS
7.5000 mg | ORAL_TABLET | Freq: Once | ORAL | Status: AC
  Administered 2017-12-07: 7.5 mg via ORAL
  Filled 2017-12-07: qty 1

## 2017-12-07 NOTE — Progress Notes (Signed)
Physical Therapy Treatment Patient Details Name: Todd Sullivan MRN: [id number] DOB: [birth year] Today's Date: 12/07/2017    History of Present Illness 62 y.o. male admitted on 11-22-2017 for SOB.  He was intubated in the ED and extubated on 12/04/17.  At the time of PT evaluation he was on 10-15 L of O2 via HFNC.  Pt dx with probably L LL CAP, and toxic metabolic encepholopathy.  Pt with pulmonary edema and pleural effusion started on lasix.  Other significant PMH includes stroke, HTN, gout, DM, COPD, CHF, asthma, anxiety,  MI s/p revasularization, and EF of 10%.      PT Comments    Patient progressing with mobility slowly.  Still needs UE support to sit on EOB, but participating in small dynamic movements within BOS.  Somewhat internally distracted hearing his own music and at times dancing, but overall appropriate affect and participating well.  Still limited by gout pain (R seems worse than L) and unable to attempt standing or pivot transfers.  Feel continued skilled PT in the acute setting appropriate prior to d/c to SNF level rehab as not appropriate for CIR.     Follow Up Recommendations  Supervision/Assistance - 24 hour;SNF     Equipment Recommendations  Rolling walker with 5" wheels;Wheelchair (measurements PT);Wheelchair cushion (measurements PT);Hospital bed    Recommendations for Other Services       Precautions / Restrictions Precautions Precautions: Fall;Other (comment)    Mobility  Bed Mobility Overal bed mobility: Needs Assistance Bed Mobility: Rolling Rolling: Mod assist;+2 for safety/equipment   Supine to sit: Max assist;+2 for physical assistance;HOB elevated Sit to supine: Max assist;+2 for physical assistance   General bed mobility comments: heavy lifting help for trunk; assist slowly for legs off bed; to supine assist for bringing trunk to R due to leaning L and for legs onto bed  Transfers                 General transfer comment: unable due to gout  pain R LE, weakness and balance  Ambulation/Gait                 Stairs            Wheelchair Mobility    Modified Rankin (Stroke Patients Only)       Balance Overall balance assessment: Needs assistance Sitting-balance support: Feet supported;Bilateral upper extremity supported Sitting balance-Leahy Scale: Poor Sitting balance - Comments: loss of balance when L hand removed from EOB, but able to hold himself up with UE support Postural control: Left lateral lean     Standing balance comment: sat EOB about 15 minutes with various activities to challenge balance: reaching for cup, turning to look over L shoulder, attempting to balance without L UE support, etc.                             Cognition Arousal/Alertness: Awake/alert Behavior During Therapy: Restless Overall Cognitive Status: Impaired/Different from baseline Area of Impairment: Attention                   Current Attention Level: Focused   Following Commands: Follows one step commands inconsistently;Follows one step commands with increased time Safety/Judgement: Decreased awareness of safety;Decreased awareness of deficits   Problem Solving: Slow processing;Decreased initiation;Requires verbal cues;Difficulty sequencing;Requires tactile cues General Comments: patient internally distracted      Exercises      General Comments General comments (skin integrity, edema, etc.): SLP  in room initially and pt able to take sips of nectar thick liquid using R hand to hold cup      Pertinent Vitals/Pain Pain Assessment: Faces Faces Pain Scale: Hurts whole lot Pain Location: more pain with R LE movement Pain Descriptors / Indicators: Grimacing;Guarding Pain Intervention(s): Monitored during session;Repositioned;RN gave pain meds during session    Home Living                      Prior Function            PT Goals (current goals can now be found in the care plan  section) Progress towards PT goals: Progressing toward goals(slowly)    Frequency    Min 3X/week      PT Plan Discharge plan needs to be updated    Co-evaluation              AM-PAC PT "6 Clicks" Daily Activity  Outcome Measure  Difficulty turning over in bed (including adjusting bedclothes, sheets and blankets)?: Unable Difficulty moving from lying on back to sitting on the side of the bed? : Unable Difficulty sitting down on and standing up from a chair with arms (e.g., wheelchair, bedside commode, etc,.)?: Unable Help needed moving to and from a bed to chair (including a wheelchair)?: Total Help needed walking in hospital room?: Total Help needed climbing 3-5 steps with a railing? : Total 6 Click Score: 6    End of Session Equipment Utilized During Treatment: Oxygen Activity Tolerance: Patient limited by fatigue Patient left: in bed;with call bell/phone within reach;with bed alarm set;with family/visitor present   PT Visit Diagnosis: Muscle weakness (generalized) (M62.81);Difficulty in walking, not elsewhere classified (R26.2)     Time: 8099-8338 PT Time Calculation (min) (ACUTE ONLY): 28 min  Charges:  $Therapeutic Activity: 23-37 mins                    G CodesSheran Lawless, [place] [phone] 12/07/2017    [name] 12/07/2017, 4:57 PM

## 2017-12-07 NOTE — Progress Notes (Signed)
PROGRESS NOTE   Todd Sullivan  ZOX:[id number]    DOB: Jun 17, 1956    DOA: November 24, 2017  PCP: Todd Able, MD   I have briefly reviewed patients previous medical records in [facility].  Brief Narrative:  CCM to [facility] transfer 12/06/17. 62 year old male with PMH of COPD, asthma, DM, CAD status post STEMI/stent 06/2017, HTN, CVA who presented via EMS with complaints of dyspnea and altered mental status.  He apparently had not done well since his MI 3 months ago.  He had not taken his medications for 5 days PTA.  He was found after a fall out of bed.  In ED, unable to protect airway and hence intubated.  He was also hypoglycemic and placed on D10 drip.  He was admitted to ICU by CCM.  Extubated 2/26.   Assessment & Plan:   Active Problems:   Acute decompensated heart failure (HCC)   Respiratory failure (HCC)   Altered mental status   Pressure injury of skin   Acute respiratory failure with hypoxia: Unable to protect airway in the ED and was intubated right away.  Managed by CCM in ICU.  Probably due to cardiogenic pulmonary edema and community-acquired pneumonia.  Extubated 2/26.  Currently saturating 99% on 4 L l/min [place] oxygen.  Wean as tolerated.  Probable community-acquired pneumonia, left lower lobe: Completed 7 days of antibiotic therapy.  Tracheal aspirate: Normal respiratory flora.  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  TTE: LVEF 10-15%, diffuse hypokinesis and diastolic dysfunction cannot be evaluated.  Continue carvedilol 3.125 mg twice daily, captopril 6.25 mg 3 times daily and BiDil.  Cardiology follow-up 2/28 appreciated.  They feel that patient is euvolemic, hypenatremic with increased serum osmolarity.  Cardiology stopped IV Lasix, checking urine osmolarity and reassessing need for diuretics on a day-to-day basis.  Chest x-ray however suggest ongoing CHF with mild pulmonary interstitial edema.   Patient's extremity edema may be third spacing of fluid.  Probable nonischemic cardiomyopathy: As per cardiology 2/23: Has single vessel coronary disease and angioplasty when he presented with acute MI 07/06/17.  No significant disease in other vessels.  Cardiology did not suspect NSTEMI.  Continue carvedilol, captopril.  Also on Plavix.  Mural thrombus: Presently on IV heparin.  Initiated Coumadin per pharmacy.  Minimal hematuria noted, monitor closely.   Obesity/Body mass index is 35 kg/m.  Acute toxic metabolic encephalopathy: Unclear as to his baseline mental status.  CT head 11-24-2017: No acute intracranial abnormalities.  ABG 12/04/17 without CO2 retention.  No focal deficits on clinical exam. Minimized sedative medications and monitor closely.  Recent AKI and current hypernatremia may be contributing.  As per discussion with CCM 2/27, patient was somnolent with waxing and waning alertness even 2/26 and this does not appear to be new.  CCM also indicated that they had extensive discussion with patient's family regarding overall poor prognosis and patient had clarified that he did not want aggressive measures including intubation or tracheostomy.  Acute renal failure: Presumed cardiorenal syndrome.  Resolved.  Hypernatremia: Unclear etiology.  Has been stable in the 148-150 range for the last week or so.  Serum osmolarity 318.  May not be Sullivan to give free water by mouth due to dysphagia and modified diet.  Hesitant to start hypotonic IV fluids due to concern for worsening CHF.  Transaminitis:?  Related to hepatic congestion.  Hepatitis panel negative.  RUQ ultrasound without evidence of acute cholecystitis.  Suspected shock liver.  Much improved.  Constipation: Had large BM on 2/22.  Continue bowel regimen.  Type II DM: A1c 6.5.  On SSI.  Controlled.  Oral diabetics on hold.  Cocaine abuse: UDS positive for cocaine.  Normocytic anemia: Relatively stable.  Follow CBCs closely.  Dysphagia:  On a modified diet as per speech therapy.  Adult failure to thrive: Multifactorial.  Patient very ill and has poor overall prognosis.  Palliative care consulted for goals of care.   DVT prophylaxis: Currently on IV heparin drip.  Coumadin per pharmacy initiated. Code Status: DNR.  Confirmed with CCM MD. Family Communication: None at bedside. Unable to reach daughter or sister on phone numbers that are available. Disposition: To be determined.   Consultants:  CCM-signed off 2/26 Cardiology Rehab MD  Procedures:  Foley catheter RUE PICC line Extubated 2/26  Antimicrobials:  Completed course   Subjective: On my visit today, patient somnolent but arousable, mumbles incomprehensively.  Oriented to self.  Unable to provide any coherent history.  As per nursing, patient reports pain on moving.  Intermittent mild wheezing.  ROS: As above.  Objective:  Vitals:   12/07/17 0800 12/07/17 1242 12/07/17 1358 12/07/17 1442  BP: (!) 136/92 (!) 146/105 128/75   Pulse: 98 (!) 102 (!) 104   Resp: (!) 30 (!) 30    Temp: 97.7 F (36.5 C) 98.4 F (36.9 C)    TempSrc: Oral Oral    SpO2: 99% 99%  97%  Weight:      Height:        Examination:  General exam: Middle-aged male, moderately built and obese, sitting up comfortably in bed.  Unkempt.  Body odor +.  Ill looking. Respiratory system: Diminished breath sounds in the bases with few bibasilar crackles.  Rest of lung fields seem clear to auscultation.  No increased work of breathing. Cardiovascular system: S1 & S2 heard, RRR. No JVD, murmurs, rubs, gallops or clicks.  1+ pitting pedal edema.  Also has trace bilateral upper extremity edema. Gastrointestinal system: Abdomen is nondistended, soft and nontender. No organomegaly or masses felt. Normal bowel sounds heard. Central nervous system: Waxing and waning mental status as indicated above. No focal neurological deficits. Extremities: Moves all extremities symmetrically. Skin: No  rashes, lesions or ulcers Psychiatry: Judgement and insight impaired. Mood & affect cannot be assessed..     Data Reviewed: I have personally reviewed following labs and imaging studies  CBC: Recent Labs  Lab 12/03/17 0417 12/04/17 0400 12/05/17 0428 12/06/17 0334 12/07/17 0450  WBC 10.7* 9.5 10.3 8.1 9.6  NEUTROABS  --  4.7  --   --   --   HGB 11.9* 11.6* 11.8* 10.8* 11.8*  HCT 37.7* 37.5* 37.9* 36.2* 38.6*  MCV 82.9 82.6 82.9 84.8 84.8  PLT 206 220 292 408* 467*   Basic Metabolic Panel: Recent Labs  Lab 12/03/17 0417 12/04/17 0400 12/05/17 0428 12/06/17 0334 12/07/17 0450  NA 149* 150* 149* 149* 148*  K 3.7 3.7 3.3* 4.3 3.8  CL 111 113* 108 109 108  CO2 27 28 28 29 29   GLUCOSE 154* 138* 87 101* 114*  BUN 71* 74* 55* 43* 28*  CREATININE 1.29* 1.24 1.17 1.16 1.05  CALCIUM 8.6* 8.6* 8.9 8.9 9.1  MG  --  2.1 1.9 1.9  --   PHOS  --   --  3.7 3.4  --    Liver Function Tests: Recent Labs  Lab 12/07/17 0450  AST 28  ALT 56  ALKPHOS 82  BILITOT 1.9*  PROT  6.7  ALBUMIN 2.8*   Coagulation Profile: Recent Labs  Lab 12/07/17 0450  INR 1.56   Cardiac Enzymes: No results for input(s): CKTOTAL, CKMB, CKMBINDEX, TROPONINI in the last 168 hours. HbA1C: No results for input(s): HGBA1C in the last 72 hours. CBG: Recent Labs  Lab 12/06/17 1940 12/07/17 0010 12/07/17 0404 12/07/17 0800 12/07/17 1151  GLUCAP 127* 105* 124* 107* 129*    Recent Results (from the past 240 hour(s))  Culture, respiratory (NON-Expectorated)     Status: None   Collection Time: 12/03/17  1:35 PM  Result Value Ref Range Status   Specimen Description TRACHEAL ASPIRATE  Final   Special Requests NONE  Final   Gram Stain   Final    RARE WBC PRESENT, PREDOMINANTLY MONONUCLEAR RARE SQUAMOUS EPITHELIAL CELLS PRESENT NO ORGANISMS SEEN    Culture   Final    FEW Consistent with normal respiratory flora. Performed at [facility], [address], [place], [place] [address]     Report Status 12/06/2017 FINAL  Final         Radiology Studies: Dg Chest Port 1 View  Result Date: 12/07/2017 CLINICAL DATA:  CHF.  History of asthma-COPD, previous CVA EXAM: PORTABLE CHEST 1 VIEW COMPARISON:  Portable chest x-ray of December 05, 2017 FINDINGS: The cardiac silhouette remains enlarged. The pulmonary vascularity is engorged. The interstitial markings remain increased. The left hemidiaphragm is largely obscured however. There is no large pleural effusion. The right-sided PICC line tip projects over the midportion of the SVC. IMPRESSION: CHF with mild pulmonary interstitial edema. Probable small left pleural effusion. Electronically Signed   By: David  [place] M.D.   On: 12/07/2017 07:52        Scheduled Meds: . captopril  6.25 mg Oral TID  . carvedilol  3.125 mg Oral BID WC  . clopidogrel  75 mg Oral Daily  . docusate sodium  100 mg Oral Daily  . ipratropium-albuterol  3 mL Nebulization Q6H  . isosorbide-hydrALAZINE  1 tablet Oral TID  . multivitamin with minerals  1 tablet Oral Daily  . senna  1 tablet Oral Daily  . sodium chloride flush  10-40 mL Intracatheter Q12H  . sodium chloride flush  10-40 mL Intracatheter Q12H  . warfarin  7.5 mg Oral ONCE-1800  . Warfarin - Pharmacist Dosing Inpatient   Does not apply q1800   Continuous Infusions: . sodium chloride 250 mL (12/02/17 0700)  . heparin 1,850 Units/hr (12/07/17 0807)     LOS: 16 days     Marcellus Scott, MD, FACP, [facility]. [facility] Pager [phone]  If 7PM-7AM, please contact night-coverage [email or web address] Password [facility] 12/07/2017, 2:52 PM

## 2017-12-07 NOTE — Progress Notes (Signed)
ANTICOAGULATION CONSULT NOTE   Pharmacy Consult for Heparin Indication: new apical thrombus  Allergies  Allergen Reactions  . Banana     Sick   . Chlorhexidine Gluconate     Caused contact dermatitis on skin  . Egg Yolk Nausea And Vomiting  . Kiwi Extract Nausea And Vomiting   Patient Measurements: Height: 6\' 3"  (190.5 cm) Weight: 280 lb (127 kg) IBW/kg (Calculated) : 84.5 Heparin Dosing Weight: 116 kg  Assessment: 62 y.o. male on heparin gtt for new mural thrombus noted on ECHO. Coumadin restarted on 2/27. INR today is 1.56. Last heparin gtt down to 0.28. Hgb 11.8, plts 467.  Goal of Therapy:  Heparin level 0.3-0.7 units/ml Monitor platelets by anticoagulation protocol: Yes   Plan:  Increase heparin gtt to 1,850 units/hr  Give Coumadin 7.5mg  po x 1 Monitor daily INR / heparin level, CBC, s/s of bleed  Enzo Bi, PharmD, [facility] Clinical Pharmacist Pager [phone] 12/07/2017 7:43 AM

## 2017-12-07 NOTE — Progress Notes (Signed)
Nutrition Follow-up  DOCUMENTATION CODES:   Obesity unspecified  INTERVENTION:   -Magic Cup TID with meals -MVI daily  NUTRITION DIAGNOSIS:   Increased nutrient needs related to wound healing as evidenced by estimated needs.  Ongoing  GOAL:   Patient will meet greater than or equal to 90% of their needs  Progressing  MONITOR:   PO intake, Supplement acceptance, Diet advancement, Labs, Weight trends, Skin, I & O's  REASON FOR ASSESSMENT:   Consult Enteral/tube feeding initiation and management  ASSESSMENT:   62 yo Male with PMH significant for COPD, asthma, diabetes, CAD status post STEMI/ stent 06/2017, hypertension, and CVA who presented via EMS with complaints of shortness of breath and altered mental status.  2/26- extubated, TF d/c, transferred to floor from ICU 2/27- s/p FEES, advanced to dysphagia 2 diet with nectar thick liquids  Pt being cleaned by nursing staff at this time; unable to speak with pt.   Pt is tolerating current diet well; noted meal completion 100%. RD will add supplements, due to increased nutritional needs for wound healing  Labs reviewed: Na: 148, CBGS: 107-124  Diet Order:  DIET DYS 2 Room service appropriate? Yes; Fluid consistency: Nectar Thick  EDUCATION NEEDS:   Not appropriate for education at this time  Skin:  Skin Assessment: Skin Integrity Issues: Skin Integrity Issues:: Stage II, Other (Comment) Stage II: R buttocks Other: MASD to groin  Last BM:  12/06/17  Height:   Ht Readings from Last 1 Encounters:  11/27/17 6\' 3"  (1.905 m)    Weight:   Wt Readings from Last 1 Encounters:  12/07/17 280 lb (127 kg)    Ideal Body Weight:  89 kg  BMI:  Body mass index is 35 kg/m.  Estimated Nutritional Needs:   Kcal:  2200-2400  Protein:  130-145 gm   Fluid:  2.2-2.4 L    Egidio Lofgren A. Mayford Knife, RD, LDN, CDE Pager: [phone] After hours Pager: [phone]

## 2017-12-07 NOTE — Progress Notes (Signed)
  Speech Language Pathology Treatment: Dysphagia  Patient Details Name: Todd Sullivan MRN: [id number] DOB: [birth year] Today's Date: 12/07/2017 Time: 2119-4174 SLP Time Calculation (min) (ACUTE ONLY): 15 min  Assessment / Plan / Recommendation Clinical Impression  F/u diet tolerance assessment complete s/p FEES 2/27. Patient lethargic but arousable, cooperative, confused. Spouse present and assisting with feeding patient in an almost fully reclined position increasing aspiration risk. SLP provided instruction and physical assistance with 90 degree positioning to maximize safety with po intake. Eventually positioned edge of bed with PT assistance to further maximize alertness. Patient able to self feed nectar thick liquids, thickened by SLP for demonstration to spouse, without overt indication of aspiration. Intake limited due to lethargy today. Overall, current diet remains appropriate. Recommend continuation of current diet as based on penetration seen on FEES and continued lethargy, AMS, risk of aspiration judged too high for upgrade to thin liquids today.    HPI HPI: 62 year old male with past medical history significant for COPD, asthma, diabetes, CAD status post STEMI/ stent 06/2017, hypertension, and CVA who presented via EMS with complaints of shortness of breath and altered mental status on 2/12. Intubated on arrival secondary to inability to protect airway. Extubated 2/25 upon pt wishes. CXR improving from admission, now showing There is a degree of pulmonary vascular congestion. Cardiomegaly is stable. Small left pleural effusion. Mild bibasilar atelectasis. No consolidation. Central catheter tips in superior vena cava. No pneumothorax.       SLP Plan  Continue with current plan of care       Recommendations  Diet recommendations: Dysphagia 2 (fine chop);Nectar-thick liquid Liquids provided via: Cup Medication Administration: Whole meds with puree Supervision: Staff to assist with  self feeding;Full supervision/cueing for compensatory strategies Compensations: Slow rate;Small sips/bites;Minimize environmental distractions Postural Changes and/or Swallow Maneuvers: Seated upright 90 degrees                Oral Care Recommendations: Oral care BID SLP Visit Diagnosis: Dysphagia, oropharyngeal phase (R13.12) Plan: Continue with current plan of care       GO              [facility] MA, CCC-SLP [phone]   Todd Sullivan 12/07/2017, 3:27 PM

## 2017-12-07 NOTE — Progress Notes (Addendum)
Subjective:  In good spirits. Smiles. Eating well.  3.3 L urine output/24 hrs.  Cr down to 1.05 today  Hypernatremia Na 148    Objective:  Vital Signs in the last 24 hours: Temp:  [98.1 F (36.7 C)-98.2 F (36.8 C)] 98.1 F (36.7 C) (02/28 0409) Pulse Rate:  [95-101] 101 (02/28 0409) Resp:  [20-24] 24 (02/28 0409) BP: (115-127)/(76-87) 122/76 (02/28 0409) SpO2:  [99 %-100 %] 100 % (02/28 0409) Weight:  [127 kg (280 lb)] 127 kg (280 lb) (02/28 0409)  Intake/Output from previous day: 02/27 0701 - 02/28 0700 In: 120 [P.O.:100; I.V.:20] Out: 3350 [Urine:3350] Intake/Output from this shift: No intake/output data recorded.  Net -10.8 L  Physical Exam: Nursing note and vitals reviewed. Constitutional: He appears well-developed.  Generalized weakness. No focal deficit  HENT:  Head: Normocephalic.  Eyes: PEERL. No conjunctival discahrge Neck: Normal range of motion. Neck supple. JVD present.  Cardiovascular: Normal rate and regular rhythm.  Murmur (II/VI holosystolic murmur RLSB) heard. Distant heart sounds.   Respiratory: Effort normal.  Clear lungs, diminished breath sounds at bases  GI: Soft. Bowel sounds are normal. No distension Musculoskeletal: Normal range of motion. He exhibits edema.  Lymphadenopathy:    He has no cervical adenopathy.  Neurological:  Awake. Alert. Oriented X3. No focal deficit Skin: Skin is warm and dry.  Psychiatric: Appropriate affect. Smiles.    Lab Results: Recent Labs    12/06/17 0334 12/07/17 0450  WBC 8.1 9.6  HGB 10.8* 11.8*  PLT 408* 467*   Recent Labs    12/06/17 0334 12/07/17 0450  NA 149* 148*  K 4.3 3.8  CL 109 108  CO2 29 29  GLUCOSE 101* 114*  BUN 43* 28*  CREATININE 1.16 1.05   No results for input(s): TROPONINI in the last 72 hours.  Invalid input(s): CK, MB Hepatic Function Panel Recent Labs    12/07/17 0450  PROT 6.7  ALBUMIN 2.8*  AST 28  ALT 56  ALKPHOS 82  BILITOT 1.9*   Echocardiogram  11/23/2017 Study Conclusions   - Left ventricle: The cavity size was moderately dilated. Wall   thickness was increased in a pattern of mild LVH. Systolic   function was severely reduced. The estimated ejection fraction   was in the range of 10% to 15%. Diffuse hypokinesis. Akinesis of   the apical myocardium. The study is not technically sufficient to   allow evaluation of LV diastolic function. Acoustic contrast   opacification revealed a large, apicalthrombusassociated with an   akinetic segment. - Ventricular septum: The contour showed diastolic flattening and   systolic flattening. These changes are consistent with RV volume   and pressure overload. - Aortic valve: There was trivial regurgitation. - Mitral valve: There was mild to moderate regurgitation. - Left atrium: The atrium was severely dilated. - Right ventricle: The cavity size was severely dilated. Systolic   function was severely reduced. - Right atrium: The atrium was severely dilated. - Atrial septum: No defect or patent foramen ovale was identified. - Tricuspid valve: There was moderate regurgitation.  Assessment: Acute on chronic systolic and diastolic heart failure Nonischemic cardiomyopathy Apical thrombus CAD, single vessel disease s/p primary PCI to LCx Hypernatremia:  Euvolumic with increased serum osmolality.  AKI: Resolved Morbid obesity Cocaine abuse: IDS positive on admission CAP: Completed treatment Acute hypoxic failure: Resolved   Recommendations:  Continue bidil 1 tab of 20-37.5 mg tid and captopril 6.25 mg tid for afterload reduction Continue coreg 3.125 mg bid. Further  uptitration of GDMT for heart failure most likely outpatient.  Recommend stopping IV lasix. Hold diuretics for today. Check urine osm. Agree with cautious use of free water   Continue heparin/warfarin bridge. Goal INR 2.0-3.0 Will reassess need for diuretics on a day to day basis. Suspect he will need rehab   LOS: 16  days    Todd Sullivan 12/07/2017, 8:22 AM  Kenai Fluegel Emiliano Dyer, MD [facility] [place] Pager: [phone] Office: [phone] If no answer Cell [phone]

## 2017-12-07 NOTE — Progress Notes (Signed)
ANTICOAGULATION CONSULT NOTE   Pharmacy Consult for Heparin Indication: new apical thrombus  Allergies  Allergen Reactions  . Banana     Sick   . Chlorhexidine Gluconate     Caused contact dermatitis on skin  . Egg Yolk Nausea And Vomiting  . Kiwi Extract Nausea And Vomiting   Patient Measurements: Height: 6\' 3"  (190.5 cm) Weight: 280 lb (127 kg) IBW/kg (Calculated) : 84.5 Heparin Dosing Weight: 116 kg  Assessment: 62 y.o. male on heparin gtt for new mural thrombus noted on ECHO. Coumadin restarted on 2/27. INR today is 1.56. Last heparin gtt remains borderline low at 0.28 after rate increase this am. Hgb 11.8, plts 467.  Goal of Therapy:  Heparin level 0.3-0.7 units/ml Monitor platelets by anticoagulation protocol: Yes   Plan:  Increase heparin gtt to 1,950 units/hr  Give Coumadin 7.5mg  po x 1 Monitor daily INR / heparin level, CBC, s/s of bleed  Enzo Bi, PharmD, [facility] Clinical Pharmacist Pager [phone] 12/07/2017 3:17 PM

## 2017-12-07 NOTE — Consult Note (Signed)
Physical Medicine and Rehabilitation Consult   Reason for Consult: Debility with encephalopathy.  Referring Physician: Dr. Waymon Amato.    HPI: Todd Sullivan is a 62 y.o. male with history of moderate to severe COPD, T2DM, OSA, HTN, single vessel CAD s/p stent 9/18,  CVA, FTT since MI who was admitted with reports of malaise for 5 days PTA and fall out of bed on 12-01-2017. He was noted to be profoundly hypoglycemic with inability to protect his airway and was intubated in ED and started on dextrose drip. UDS positive for cocaine. CT head without acute abnormality.  He was treated for possible LLL CAP and required lasix as well as milrinone for diuresis due to acute on chronic CHF. 2 D echo done revealing EF 10-15% with diffuse hypokinesis and large apical thrombus associated with akinetic segment. He was started on IV heparin and  Dr. Rosemary Holms following of input and felt that CM likely due to combination of cocaine induced CM and sepsis in setting of underlying combined CHF. He tolerated extubation 2/25 and now with decreased in oxygen needs to 4-5 L at rest. He continues to be encephalopathic with mild oropharyngeal dysphagia and debilitated. CIR recommended for follow up therapy.    Review of Systems  Unable to perform ROS: Mental acuity      Past Medical History:  Diagnosis Date  . Anxiety   . Asthma   . CHF (congestive heart failure) (HCC)   . COPD (chronic obstructive pulmonary disease) (HCC)   . Depression   . Diabetes mellitus   . Gout   . Headache(784.0)   . Hypertension   . Shortness of breath   . Stroke [facility])     Past Surgical History:  Procedure Laterality Date  . CORONARY/GRAFT ACUTE MI REVASCULARIZATION N/A 07/06/2017   Procedure: Coronary/Graft Acute MI Revascularization;  Surgeon: Elder Negus, MD;  Location: [facility] INVASIVE CV LAB;  Service: Cardiovascular;  Laterality: N/A;  . EYE SURGERY  62 years old  . LEFT HEART CATH AND CORONARY ANGIOGRAPHY N/A  07/06/2017   Procedure: LEFT HEART CATH AND CORONARY ANGIOGRAPHY;  Surgeon: Elder Negus, MD;  Location: [facility] INVASIVE CV LAB;  Service: Cardiovascular;  Laterality: N/A;    Family History  Problem Relation Age of Onset  . Diabetes type II Mother   . Asthma Mother   . Diabetes type II Sister   . Asthma Sister   . Asthma Brother   . Asthma Son     Social History: Indicates that he lives alone? Per reports that he has been smoking cigarettes.  He has a 20.00 pack-year smoking history. He has never used smokeless tobacco. Per reports that he does not drink alcohol or use drugs. + cocaine on admission.    Allergies  Allergen Reactions  . Banana     Sick   . Chlorhexidine Gluconate     Caused contact dermatitis on skin  . Egg Yolk Nausea And Vomiting  . Kiwi Extract Nausea And Vomiting   Medications Prior to Admission  Medication Sig Dispense Refill  . albuterol (PROVENTIL HFA;VENTOLIN HFA) 108 (90 BASE) MCG/ACT inhaler Inhale 2 puffs into the lungs every 4 (four) hours as needed. For shortness of breath. 1 Inhaler 3  . atorvastatin (LIPITOR) 80 MG tablet Take 1 tablet (80 mg total) by mouth daily at 6 PM. 30 tablet 6  . budesonide-formoterol (SYMBICORT) 160-4.5 MCG/ACT inhaler Inhale 2 puffs into the lungs 2 (two) times daily. 1 Inhaler 3  .  colchicine 0.6 MG tablet Take 0.6 mg by mouth at bedtime.   0  . cyclobenzaprine (FLEXERIL) 10 MG tablet Take 10 mg by mouth 2 (two) times daily.    Marland Kitchen diltiazem (TIAZAC) 180 MG 24 hr capsule Take 360 mg by mouth at bedtime.    . furosemide (LASIX) 40 MG tablet Take 1 tablet (40 mg total) by mouth as needed. For leg swelling and shortness of breath 30 tablet 1  . isosorbide-hydrALAZINE (BIDIL) 20-37.5 MG tablet Take 1 tablet by mouth 3 (three) times daily. 30 tablet 1  . metFORMIN (GLUCOPHAGE) 500 MG tablet Take 500 mg by mouth 2 (two) times daily with a meal.    . ticagrelor (BRILINTA) 90 MG TABS tablet Take 1 tablet (90 mg total) by mouth  2 (two) times daily. 60 tablet 6  . carvedilol (COREG) 25 MG tablet Take 1 tablet (25 mg total) by mouth 2 (two) times daily with a meal. (Patient not taking: Reported on 12-14-17) 60 tablet 4  . nicotine (NICODERM CQ - DOSED IN MG/24 HR) 7 mg/24hr patch Place 1 patch (7 mg total) onto the skin daily. (Patient not taking: Reported on December 14, 2017) 28 patch 0    Home: Home Living Family/patient expects to be discharged to:: Private residence Living Arrangements: Spouse/significant other Available Help at Discharge: Family, Personal care attendant Type of Home: House Home Access: Stairs to enter Secretary/administrator of Steps: 4 Entrance Stairs-Rails: (post on the L) Home Layout: One level Home Equipment: Cane - single point Additional Comments: Information from chart and PT note; no family present and pt not able to report today. Per PT note, has an aide to assist with heavier housework  Functional History: Prior Function Level of Independence: Independent with assistive device(s) Comments: Per chart, used cane for gait. Does bathing and dressing. Needs assist with heavier chores from aide. Per pt he used 2-3 L O2 [place] PRN at home.  Functional Status:  Mobility: Bed Mobility Overal bed mobility: Needs Assistance Bed Mobility: Rolling Rolling: Mod assist, +2 for physical assistance, Max assist Supine to sit: HOB elevated, Mod assist Sit to supine: +2 for physical assistance, Max assist General bed mobility comments: Mod assist to roll to the R; max assist to roll to the L.  Transfers General transfer comment: Pt is too weak to safely attempt at this time without a second person, and honestly, he will likely need lift equipment initially.  Ambulation/Gait General Gait Details: unable at this time.     ADL: ADL Overall ADL's : Needs assistance/impaired Eating/Feeding: NPO Grooming: Minimal assistance, Bed level, Applying deodorant, Wash/dry face Grooming Details (indicate cue type  and reason): Followed commands to wash face and apply deoderant. Assist as pt attempting to apply deoderant over his gown.  Upper Body Bathing: Moderate assistance, Bed level Lower Body Bathing: Maximal assistance, Bed level Upper Body Dressing : Moderate assistance, Bed level Lower Body Dressing: Maximal assistance, Bed level Toileting- Clothing Manipulation and Hygiene: Total assistance, Bed level General ADL Comments: Pt received completing pericare with mobility tech and nurse tech. Required assistance to roll bilaterally. Seated in bed with HOB elevated, pt was able to apply deoderant and wash his face on command.   Cognition: Cognition Overall Cognitive Status: Impaired/Different from baseline Orientation Level: Oriented to person, Disoriented to time, Disoriented to situation, Disoriented to place Cognition Arousal/Alertness: Awake/alert Behavior During Therapy: Restless, Impulsive Overall Cognitive Status: Impaired/Different from baseline Area of Impairment: Problem solving, Awareness, Safety/judgement, Following commands Following Commands: Follows one step  commands inconsistently(approximately 75% of the time) Safety/Judgement: Decreased awareness of safety, Decreased awareness of deficits Awareness: Intellectual Problem Solving: Slow processing, Decreased initiation, Difficulty sequencing, Requires verbal cues, Requires tactile cues General Comments: Pt speaking non-sensically and tangentially at times but is able to answer direct, simple questions. Followed direct commands with visual cues.   Blood pressure (!) 136/92, pulse 98, temperature 97.7 F (36.5 C), temperature source Oral, resp. rate (!) 30, height 6\' 3"  (1.905 m), weight 127 kg (280 lb), SpO2 99 %. Physical Exam  Nursing note and vitals reviewed. Constitutional: He appears well-developed and well-nourished. He appears lethargic. He is easily aroused. No distress. Nasal cannula in place.  Obese male slumped to the  left in bed.   HENT:  Head: Normocephalic and atraumatic.  Eyes: Conjunctivae are normal. Pupils are equal, round, and reactive to light.  Exophthalmus noted   Neck: Normal range of motion.  Cardiovascular: Regular rhythm. Tachycardia present.  Respiratory: No stridor. No respiratory distress. He has wheezes. He exhibits no tenderness.  Increased WOB with conversation. Audible wheezing noted.   GI: Soft. Bowel sounds are normal. He exhibits no distension. There is no tenderness.  Genitourinary:  Genitourinary Comments: Foley in place.   Neurological: He is easily aroused. He appears lethargic.  Had difficulty staying awake. Oriented to self only. He was able to state name with cues but output limited with garbled speech. Able to follow simple motor commands with tactile cues. RLE limited by discomfort.   Skin: Skin is warm and dry. He is not diaphoretic.  Psychiatric: His affect is inappropriate. His speech is slurred. Cognition and memory are impaired. He expresses inappropriate judgment. He is inattentive.  Patient is dyspneic at rest, speech is unintelligible secondary to poor breath support related to dyspnea Difficult to assess manual muscle testing secondary to lethargy.  Grossly 3- in the right upper extremity 4- in the left upper extremity and 3- bilateral lower extremities.  Results for orders placed or performed during the hospital encounter of 29-Nov-2017 (from the past 24 hour(s))  Glucose, capillary     Status: Abnormal   Collection Time: 12/06/17 12:11 PM  Result Value Ref Range   Glucose-Capillary 119 (H) 65 - 99 mg/dL  Osmolality     Status: Abnormal   Collection Time: 12/06/17  3:55 PM  Result Value Ref Range   Osmolality 318 (H) 275 - 295 mOsm/kg  Glucose, capillary     Status: Abnormal   Collection Time: 12/06/17  4:29 PM  Result Value Ref Range   Glucose-Capillary 134 (H) 65 - 99 mg/dL  Glucose, capillary     Status: Abnormal   Collection Time: 12/06/17  7:40 PM    Result Value Ref Range   Glucose-Capillary 127 (H) 65 - 99 mg/dL  Glucose, capillary     Status: Abnormal   Collection Time: 12/07/17 12:10 AM  Result Value Ref Range   Glucose-Capillary 105 (H) 65 - 99 mg/dL  Glucose, capillary     Status: Abnormal   Collection Time: 12/07/17  4:04 AM  Result Value Ref Range   Glucose-Capillary 124 (H) 65 - 99 mg/dL  Heparin level (unfractionated)     Status: Abnormal   Collection Time: 12/07/17  4:50 AM  Result Value Ref Range   Heparin Unfractionated 0.28 (L) 0.30 - 0.70 IU/mL  CBC     Status: Abnormal   Collection Time: 12/07/17  4:50 AM  Result Value Ref Range   WBC 9.6 4.0 - 10.5 K/uL   RBC  4.55 4.22 - 5.81 MIL/uL   Hemoglobin 11.8 (L) 13.0 - 17.0 g/dL   HCT 65.7 (L) 84.6 - 96.2 %   MCV 84.8 78.0 - 100.0 fL   MCH 25.9 (L) 26.0 - 34.0 pg   MCHC 30.6 30.0 - 36.0 g/dL   RDW 95.2 (H) 84.1 - 32.4 %   Platelets 467 (H) 150 - 400 K/uL  Comprehensive metabolic panel     Status: Abnormal   Collection Time: 12/07/17  4:50 AM  Result Value Ref Range   Sodium 148 (H) 135 - 145 mmol/L   Potassium 3.8 3.5 - 5.1 mmol/L   Chloride 108 101 - 111 mmol/L   CO2 29 22 - 32 mmol/L   Glucose, Bld 114 (H) 65 - 99 mg/dL   BUN 28 (H) 6 - 20 mg/dL   Creatinine, Ser 4.01 0.61 - 1.24 mg/dL   Calcium 9.1 8.9 - 02.7 mg/dL   Total Protein 6.7 6.5 - 8.1 g/dL   Albumin 2.8 (L) 3.5 - 5.0 g/dL   AST 28 15 - 41 U/L   ALT 56 17 - 63 U/L   Alkaline Phosphatase 82 38 - 126 U/L   Total Bilirubin 1.9 (H) 0.3 - 1.2 mg/dL   GFR calc non Af Amer >60 >60 mL/min   GFR calc Af Amer >60 >60 mL/min   Anion gap 11 5 - 15  Protime-INR     Status: Abnormal   Collection Time: 12/07/17  4:50 AM  Result Value Ref Range   Prothrombin Time 18.5 (H) 11.4 - 15.2 seconds   INR 1.56   Glucose, capillary     Status: Abnormal   Collection Time: 12/07/17  8:00 AM  Result Value Ref Range   Glucose-Capillary 107 (H) 65 - 99 mg/dL  Sodium, urine, random     Status: None   Collection  Time: 12/07/17  8:48 AM  Result Value Ref Range   Sodium, Ur 62 mmol/L   Dg Chest Port 1 View  Result Date: 12/07/2017 CLINICAL DATA:  CHF.  History of asthma-COPD, previous CVA EXAM: PORTABLE CHEST 1 VIEW COMPARISON:  Portable chest x-ray of December 05, 2017 FINDINGS: The cardiac silhouette remains enlarged. The pulmonary vascularity is engorged. The interstitial markings remain increased. The left hemidiaphragm is largely obscured however. There is no large pleural effusion. The right-sided PICC line tip projects over the midportion of the SVC. IMPRESSION: CHF with mild pulmonary interstitial edema. Probable small left pleural effusion. Electronically Signed   By: David  Sw[place]iland M.D.   On: 12/07/2017 07:52    Assessment/Plan: Diagnosis: Debility secondary to congestive heart failure sepsis cocaine induced cardiomyopathy, sepsis 1. Does the need for close, 24 hr/day medical supervision in concert with the patient's rehab needs make it unreasonable for this patient to be served in a less intensive setting? No 2. Co-Morbidities requiring supervision/potential complications: COPD, type 2 diabetes, obstructive sleep apnea, hypertension, coronary artery disease 3. Due to bladder management, bowel management, safety, skin/wound care, disease management and medication administration, does the patient require 24 hr/day rehab nursing? Potentially 4. Does the patient require coordinated care of a physician, rehab nurse, PT, OT to address physical and functional deficits in the context of the above medical diagnosis(es)? No Addressing deficits in the following areas: balance, endurance, locomotion, strength, transferring, bathing, dressing, toileting, cognition and psychosocial support 5. Can the patient actively participate in an intensive therapy program of at least 3 hrs of therapy per day at least 5 days per week? No 6.  The potential for patient to make measurable gains while on inpatient rehab is  poor 7. Anticipated functional outcomes upon discharge from inpatient rehab are n/a  with PT, n/a with OT, n/a with SLP. 8. Estimated rehab length of stay to reach the above functional goals is: NA 9. Anticipated D/C setting: SNF 10. Anticipated post D/C treatments: SNF 11. Overall Rehab/Functional Prognosis: poor  RECOMMENDATIONS: This patient's condition is appropriate for continued rehabilitative care in the following setting: SNF Patient has agreed to participate in recommended program. N/A Note that insurance prior authorization may be required for reimbursement for recommended care.  Comment:   Erick Colace M.D. [facility] FAAPM&R (Sports Med, Neuromuscular Med) Diplomate Am Board of Electrodiagnostic Med  Jacquelynn Cree, PA-C 12/07/2017

## 2017-12-08 ENCOUNTER — Inpatient Hospital Stay (HOSPITAL_COMMUNITY): Payer: Medicare Other

## 2017-12-08 DIAGNOSIS — E162 Hypoglycemia, unspecified: Secondary | ICD-10-CM

## 2017-12-08 DIAGNOSIS — J9602 Acute respiratory failure with hypercapnia: Secondary | ICD-10-CM

## 2017-12-08 DIAGNOSIS — Z7189 Other specified counseling: Secondary | ICD-10-CM

## 2017-12-08 DIAGNOSIS — Z515 Encounter for palliative care: Secondary | ICD-10-CM

## 2017-12-08 LAB — BASIC METABOLIC PANEL
ANION GAP: 11 (ref 5–15)
BUN: 21 mg/dL — ABNORMAL HIGH (ref 6–20)
CO2: 28 mmol/L (ref 22–32)
Calcium: 9 mg/dL (ref 8.9–10.3)
Chloride: 105 mmol/L (ref 101–111)
Creatinine, Ser: 1 mg/dL (ref 0.61–1.24)
GFR calc Af Amer: >60 mL/min (ref >60)
GFR calc non Af Amer: >60 mL/min (ref >60)
GLUCOSE: 130 mg/dL — AB (ref 65–99)
POTASSIUM: 3.4 mmol/L — AB (ref 3.5–5.1)
Sodium: 144 mmol/L (ref 135–145)

## 2017-12-08 LAB — PROTIME-INR
INR: 1.84
Prothrombin Time: 21.1 seconds — ABNORMAL HIGH (ref 11.4–15.2)

## 2017-12-08 LAB — CBC
HEMATOCRIT: 37.9 % — AB (ref 39.0–52.0)
HEMOGLOBIN: 11.5 g/dL — AB (ref 13.0–17.0)
MCH: 25.6 pg — AB (ref 26.0–34.0)
MCHC: 30.3 g/dL (ref 30.0–36.0)
MCV: 84.4 fL (ref 78.0–100.0)
Platelets: 497 10*3/uL — ABNORMAL HIGH (ref 150–400)
RBC: 4.49 MIL/uL (ref 4.22–5.81)
RDW: 23.2 % — ABNORMAL HIGH (ref 11.5–15.5)
WBC: 10.1 10*3/uL (ref 4.0–10.5)

## 2017-12-08 LAB — GLUCOSE, CAPILLARY
GLUCOSE-CAPILLARY: 100 mg/dL — AB (ref 65–99)
GLUCOSE-CAPILLARY: 103 mg/dL — AB (ref 65–99)
GLUCOSE-CAPILLARY: 89 mg/dL (ref 65–99)
Glucose-Capillary: 94 mg/dL (ref 65–99)
Glucose-Capillary: 112 mg/dL — ABNORMAL HIGH (ref 65–99)

## 2017-12-08 LAB — HEPARIN LEVEL (UNFRACTIONATED): Heparin Unfractionated: 0.53 IU/mL (ref 0.30–0.70)

## 2017-12-08 MED ORDER — WARFARIN SODIUM 5 MG PO TABS
5.0000 mg | ORAL_TABLET | Freq: Once | ORAL | Status: AC
  Administered 2017-12-08: 5 mg via ORAL
  Filled 2017-12-08: qty 1

## 2017-12-08 MED ORDER — MORPHINE SULFATE 10 MG/5ML PO SOLN
2.5000 mg | ORAL | Status: DC | PRN
  Administered 2017-12-08 – 2017-12-11 (×6): 2.5 mg via ORAL
  Filled 2017-12-08 (×6): qty 2

## 2017-12-08 MED ORDER — OLANZAPINE 2.5 MG PO TABS
2.5000 mg | ORAL_TABLET | Freq: Every day | ORAL | Status: DC
  Administered 2017-12-08 – 2017-12-11 (×4): 2.5 mg via ORAL
  Filled 2017-12-08 (×4): qty 1

## 2017-12-08 MED ORDER — IPRATROPIUM-ALBUTEROL 0.5-2.5 (3) MG/3ML IN SOLN: 3.0000 mL | Freq: Four times a day (QID) | RESPIRATORY_TRACT | Status: DC

## 2017-12-08 MED ORDER — FUROSEMIDE 10 MG/ML IJ SOLN
40.0000 mg | Freq: Once | INTRAMUSCULAR | Status: AC
  Administered 2017-12-08: 40 mg via INTRAVENOUS
  Filled 2017-12-08: qty 4

## 2017-12-08 MED ORDER — MORPHINE SULFATE (PF) 4 MG/ML IV SOLN
2.0000 mg | Freq: Once | INTRAVENOUS | Status: AC
  Administered 2017-12-08: 2 mg via INTRAVENOUS
  Filled 2017-12-08: qty 1

## 2017-12-08 MED ORDER — FUROSEMIDE 40 MG PO TABS
40.0000 mg | ORAL_TABLET | Freq: Every day | ORAL | Status: DC
  Administered 2017-12-08 – 2017-12-11 (×4): 40 mg via ORAL
  Filled 2017-12-08 (×4): qty 1

## 2017-12-08 NOTE — Progress Notes (Signed)
PROGRESS NOTE   Todd Sullivan  WUJ:[id number]    DOB: 11/26/1955    DOA: 12/06/2017  PCP: Leilani Able, MD   I have briefly reviewed patients previous medical records in [facility].  Brief Narrative:  CCM to [facility] transfer 12/06/17. 34 male COPD, asthma, DM,  CAD status post STEMI/stent 06/2017, HTN, CVA who presented via EMS with complaints of dyspnea and altered mental status. He apparently had not done well since his MI 3 months ago.  He had not taken his medications for 5 days PTA.  He was found after a fall out of bed. In ED, unable to protect airway and hence intubated.  He was also hypoglycemic and placed on D10 drip.  He was admitted to ICU by CCM.  Extubated 2/26.   Assessment & Plan:   Active Problems:   CHF (congestive heart failure) (HCC)   Acute respiratory failure (HCC)   Respiratory failure (HCC)   Altered mental status   Pressure injury of skin   Goals of care, counseling/discussion   Palliative care by specialist   Acute respiratory failure with hypoxia: Unable to protect airway in the ED and was intubated right away.  Managed by CCM in ICU.  Probably due to cardiogenic pulmonary edema and community-acquired pneumonia.  Extubated 2/26.  Currently saturating 99% on 4 L l/min [place] oxygen.  Wean as tolerated.  Probable community-acquired pneumonia, left lower lobe: Completed 7 days of antibiotic therapy.  Tracheal aspirate: Normal respiratory flora.  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  TTE: LVEF 10-15%, diffuse hypokinesis and diastolic dysfunction cannot be evaluated.  Continue carvedilol 3.125 mg twice daily, captopril 6.25 mg 3 times daily and BiDil.  Cardiology follow-up 2/28 appreciated.  They feel that patient is euvolemic, hypernatremic with increased serum osmolarity.  Cardiology managing fluids  Probable nonischemic cardiomyopathy: As per cardiology 2/23: Has single  vessel coronary disease and angioplasty when he presented with acute MI 07/06/17.  No significant disease in other vessels.  Cardiology did not suspect NSTEMI.  Continue carvedilol, captopril.  Also on Plavix.  Mural thrombus: Presently on IV heparin.  Initiated Coumadin per pharmacy.  Minimal hematuria noted, monitor closely.  INR still subt  Obesity/Body mass index is 35.12 kg/m.  Acute toxic metabolic encephalopathy: Unclear as to his baseline mental status.  CT head 11/26/2017: No acute intracranial abnormalities.  ABG 12/04/17 without CO2 retention.  No focal deficits on clinical exam. Minimized sedative medications and monitor closely.  Recent AKI and current hypernatremia may be contributing.  As per discussion with CCM 2/27, patient was somnolent with waxing and waning alertness even 2/26 and this does not appear to be new.  CCM also indicated that they had extensive discussion with patient's family regarding overall poor prognosis and patient had clarified that he did not want aggressive measures including intubation or tracheostomy. Starting Zyprexa 2.5 daily for metabolic encephalopathy with some hallucinations  Acute renal failure: Presumed cardiorenal syndrome.  Resolved.  Hypernatremia: Unclear etiology.  Has been stable in the 148-150 range for the last week or so.  Serum osmolarity 318.  May not be able to give free water by mouth due to dysphagia and modified diet.  Hesitant to start hypotonic IV fluids due to concern for worsening CHF.  Transaminitis:?  Related to hepatic congestion.  Hepatitis panel negative.  RUQ ultrasound without evidence of acute cholecystitis.  Suspected shock liver.  Much improved. Rpt LFT's in am  Constipation: Had large  BM on 2/22.  Continue bowel regimen.  Type II DM: A1c 6.5.  On SSI.  Controlled.  Oral diabetics on hold.  Cocaine abuse: UDS positive for cocaine-Uses Crack at home, unclear if this has affected mentation long term  Normocytic anemia:  Relatively stable.  Follow CBCs closely.  Dysphagia: On a modified diet as per speech therapy.  Adult failure to thrive: Multifactorial.  Patient very ill and has poor overall prognosis.  Palliative care consulted for goals of care.   DVT prophylaxis: Currently on IV heparin drip.  Coumadin per pharmacy initiated. Code Status: DNR.  Confirmed with CCM MD. Family Communication: d/w son at bedside Disposition: To be determined.   Consultants:  CCM-signed off 2/26 Cardiology Rehab MD  Procedures:  Foley catheter RUE PICC line Extubated 2/26  Antimicrobials:  Completed course   Subjective:  Awake but confused Can follow commands but not making much sense-in addition some hallucination  Objective:  Vitals:   12/08/17 0447 12/08/17 0833 12/08/17 0854 12/08/17 1302  BP:   126/80   Pulse:   (!) 104   Resp:      Temp:      TempSrc:      SpO2:  100%  96%  Weight: 127.5 kg (281 lb)     Height:        Examination:  General exam: Middle-aged male, moderately built and obese, sitting up comfortably in bed.  Unkempt.  Body odor +.  Ill looking. Respiratory system: Diminished breath sounds in the bases with few bibasilar crackles.  Rest of lung fields seem clear to auscultation.  No increased work of breathing. Cardiovascular system: S1 & S2 heard, RRR. No JVD, murmurs, rubs, gallops or clicks.  1+ pitting pedal edema.  Also has trace bilateral upper extremity edema. Gastrointestinal system: Abdomen is nondistended, soft and nontender. No organomegaly or masses felt. Normal bowel sounds heard. Central nervous system: Waxing and waning mental status as indicated above. No focal neurological deficits. Extremities: Moves all extremities symmetrically. Skin: No rashes, lesions or ulcers Psychiatry: Judgement and insight impaired. Mood & affect cannot be assessed..   Data Reviewed: I have personally reviewed following labs and imaging studies  CBC: Recent Labs  Lab  12/04/17 0400 12/05/17 0428 12/06/17 0334 12/07/17 0450 12/08/17 0737  WBC 9.5 10.3 8.1 9.6 10.1  NEUTROABS 4.7  --   --   --   --   HGB 11.6* 11.8* 10.8* 11.8* 11.5*  HCT 37.5* 37.9* 36.2* 38.6* 37.9*  MCV 82.6 82.9 84.8 84.8 84.4  PLT 220 292 408* 467* 497*   Basic Metabolic Panel: Recent Labs  Lab 12/04/17 0400 12/05/17 0428 12/06/17 0334 12/07/17 0450 12/08/17 0737  NA 150* 149* 149* 148* 144  K 3.7 3.3* 4.3 3.8 3.4*  CL 113* 108 109 108 105  CO2 28 28 29 29 28   GLUCOSE 138* 87 101* 114* 130*  BUN 74* 55* 43* 28* 21*  CREATININE 1.24 1.17 1.16 1.05 1.00  CALCIUM 8.6* 8.9 8.9 9.1 9.0  MG 2.1 1.9 1.9  --   --   PHOS  --  3.7 3.4  --   --    Liver Function Tests: Recent Labs  Lab 12/07/17 0450  AST 28  ALT 56  ALKPHOS 82  BILITOT 1.9*  PROT 6.7  ALBUMIN 2.8*   Coagulation Profile: Recent Labs  Lab 12/07/17 0450 12/08/17 0737  INR 1.56 1.84   Cardiac Enzymes: No results for input(s): CKTOTAL, CKMB, CKMBINDEX, TROPONINI in the last 168  hours. HbA1C: No results for input(s): HGBA1C in the last 72 hours. CBG: Recent Labs  Lab 12/07/17 2027 12/07/17 2331 12/08/17 0439 12/08/17 0809 12/08/17 1312  GLUCAP 140* 106* 94 112* 100*    Recent Results (from the past 240 hour(s))  Culture, respiratory (NON-Expectorated)     Status: None   Collection Time: 12/03/17  1:35 PM  Result Value Ref Range Status   Specimen Description TRACHEAL ASPIRATE  Final   Special Requests NONE  Final   Gram Stain   Final    RARE WBC PRESENT, PREDOMINANTLY MONONUCLEAR RARE SQUAMOUS EPITHELIAL CELLS PRESENT NO ORGANISMS SEEN    Culture   Final    FEW Consistent with normal respiratory flora. Performed at [facility], [address] [place], [place] [address]    Report Status 12/06/2017 FINAL  Final    Radiology Studies: Dg Chest Port 1 View  Result Date: 12/08/2017 CLINICAL DATA:  Shortness of Breath EXAM: PORTABLE CHEST 1 VIEW COMPARISON:  12/07/2017  FINDINGS: Right PICC line now courses into the left innominate vein. Cardiomegaly with vascular congestion. No visible significant effusions or acute bony abnormality. IMPRESSION: Cardiomegaly, vascular congestion. Right PICC line courses into the left innominate vein. Electronically Signed   By: Charlett Nose M.D.   On: 12/08/2017 01:28   Dg Chest Port 1 View  Result Date: 12/07/2017 CLINICAL DATA:  CHF.  History of asthma-COPD, previous CVA EXAM: PORTABLE CHEST 1 VIEW COMPARISON:  Portable chest x-ray of December 05, 2017 FINDINGS: The cardiac silhouette remains enlarged. The pulmonary vascularity is engorged. The interstitial markings remain increased. The left hemidiaphragm is largely obscured however. There is no large pleural effusion. The right-sided PICC line tip projects over the midportion of the SVC. IMPRESSION: CHF with mild pulmonary interstitial edema. Probable small left pleural effusion. Electronically Signed   By: David  Swaziland M.D.   On: 12/07/2017 07:52   Scheduled Meds: . captopril  6.25 mg Oral TID  . carvedilol  3.125 mg Oral BID WC  . clopidogrel  75 mg Oral Daily  . docusate sodium  100 mg Oral Daily  . furosemide  40 mg Oral Daily  . ipratropium-albuterol  3 mL Nebulization QID  . isosorbide-hydrALAZINE  1 tablet Oral TID  . multivitamin with minerals  1 tablet Oral Daily  . OLANZapine  2.5 mg Oral Daily  . senna  1 tablet Oral Daily  . sodium chloride flush  10-40 mL Intracatheter Q12H  . sodium chloride flush  10-40 mL Intracatheter Q12H  . warfarin  5 mg Oral ONCE-1800  . Warfarin - Pharmacist Dosing Inpatient   Does not apply q1800   Continuous Infusions: . sodium chloride 250 mL (12/02/17 0700)  . heparin 1,950 Units/hr (12/08/17 0850)     LOS: 17 days     Pleas Koch, MD [facility]   If 7PM-7AM, please contact night-coverage [email or web address] Password [email or web address] 12/08/2017, 2:12 PM

## 2017-12-08 NOTE — Progress Notes (Signed)
ANTICOAGULATION CONSULT NOTE   Pharmacy Consult for Heparin and Coumadin Indication: new apical thrombus  Allergies  Allergen Reactions  . Banana     Sick   . Chlorhexidine Gluconate     Caused contact dermatitis on skin  . Egg Yolk Nausea And Vomiting  . Kiwi Extract Nausea And Vomiting   Patient Measurements: Height: 6\' 3"  (190.5 cm) Weight: 281 lb (127.5 kg) IBW/kg (Calculated) : 84.5 Heparin Dosing Weight: 116 kg  Assessment: 62 y.o. male on heparin gtt for new mural thrombus noted on ECHO. Coumadin restarted on 2/27. INR today is up to 1.84. Last heparin level therapeutic at 0.53. Hgb 11.5, plts 497.  Goal of Therapy:  Heparin level 0.3-0.7 units/ml  INR 2.0-3.0 Monitor platelets by anticoagulation protocol: Yes   Plan:  Continue heparin gtt at 1,950 units/hr  Give Coumadin 5mg  po x 1 Monitor daily INR / heparin level, CBC, s/s of bleed  Enzo Bi, PharmD, [facility] Clinical Pharmacist Pager [phone] 12/08/2017 9:21 AM

## 2017-12-08 NOTE — Progress Notes (Signed)
Occupational Therapy Treatment Patient Details Name: Todd Sullivan MRN: [id number] DOB: 1956/06/05 Today's Date: 12/08/2017    History of present illness 62 y.o. male admitted on 12/06/2017 for SOB.  He was intubated in the ED and extubated on 12/04/17.  At the time of PT evaluation he was on 10-15 L of O2 via HFNC.  Pt dx with probably L LL CAP, and toxic metabolic encepholopathy.  Pt with pulmonary edema and pleural effusion started on lasix.  Other significant PMH includes stroke, HTN, gout, DM, COPD, CHF, asthma, anxiety,  MI s/p revasularization, and EF of 10%.     OT comments  Pt making slow progress with functional goals. Pt continues to require extensive assist with ADLs, mobility, Poor balance/trunk control, impairments in coordination, balance, strength, endurance and awareness. OT will continue to follow acutely. CIR had declined pt, now recommending SNF for further therapy intervention after acute care d/c       Follow Up Recommendations  SNF;Supervision/Assistance - 24 hour (CIR has declined pt)   Equipment Recommendations  Other (comment)(TBD at next venue of care)    Recommendations for Other Services      Precautions / Restrictions Precautions Precautions: Fall Precaution Comments: monitor vitals closely       Mobility Bed Mobility Overal bed mobility: Needs Assistance Bed Mobility: Supine to Sit;Sit to Supine     Supine to sit: Max assist;+2 for physical assistance;HOB elevated Sit to supine: Max assist;+2 for physical assistance   General bed mobility comments: heavy lifting help for trunk; assist slowly for legs off bed; to supine assist for bringing trunk to R due to leaning L and for legs onto bed  Transfers                 General transfer comment: unable due to gout pain R LE, weakness and balance    Balance Overall balance assessment: Needs assistance Sitting-balance support: Feet supported;Bilateral upper extremity supported Sitting  balance-Leahy Scale: Poor Sitting balance - Comments: max  A + 2 for balance sitting EOB Postural control: Left lateral lean;Posterior lean                                 ADL either performed or assessed with clinical judgement   ADL Overall ADL's : Needs assistance/impaired       Grooming Details (indicate cue type and reason): unable to attempt simple grooming seated EOB due to Poor balance and fatigue                               General ADL Comments: pt sat EOB x 5 minutes with max A + 2 for baalnce and support to be maintained     Vision   Additional Comments: R gaze, L inattention   Perception     Praxis      Cognition Arousal/Alertness: Awake/alert Behavior During Therapy: Restless Overall Cognitive Status: Impaired/Different from baseline Area of Impairment: Attention                   Current Attention Level: Focused   Following Commands: Follows one step commands inconsistently;Follows one step commands with increased time Safety/Judgement: Decreased awareness of safety;Decreased awareness of deficits Awareness: Intellectual Problem Solving: Slow processing;Decreased initiation;Requires verbal cues;Difficulty sequencing;Requires tactile cues General Comments: patient  distracted        Exercises Other Exercises Other Exercises: pt able  to complete 2 repititions of reaching tasks due to Poor dynamic balance at EOB, faitgue; posterior and L lateral leaning Other Exercises: L side awareness acitivties at bed level with 2/5 correct trials   Shoulder Instructions       General Comments      Pertinent Vitals/ Pain       Pain Assessment: No/denies pain Faces Pain Scale: Hurts even more Pain Location: more pain with R LE movement Pain Descriptors / Indicators: Grimacing;Guarding Pain Intervention(s): Limited activity within patient's tolerance;Monitored during session;Repositioned  Home Living                                           Prior Functioning/Environment              Frequency  Min 3X/week        Progress Toward Goals  OT Goals(current goals can now be found in the care plan section)  Progress towards OT goals: OT to reassess next treatment     Plan Discharge plan needs to be updated    Co-evaluation                 AM-PAC PT "6 Clicks" Daily Activity     Outcome Measure   Help from another person eating meals?: Total Help from another person taking care of personal grooming?: A Little Help from another person toileting, which includes using toliet, bedpan, or urinal?: Total Help from another person bathing (including washing, rinsing, drying)?: A Lot Help from another person to put on and taking off regular upper body clothing?: A Lot Help from another person to put on and taking off regular lower body clothing?: Total 6 Click Score: 10    End of Session    OT Visit Diagnosis: Other abnormalities of gait and mobility (R26.89);Muscle weakness (generalized) (M62.81);Other symptoms and signs involving cognitive function   Activity Tolerance Patient limited by fatigue   Patient Left in bed;with call bell/phone within reach;with bed alarm set;with family/visitor present   Nurse Communication      Functional Assessment Tool Used: AM-PAC 6 Clicks Daily Activity   Time: 1610-9604 OT Time Calculation (min): 27 min  Charges: OT G-codes **NOT FOR INPATIENT CLASS** Functional Assessment Tool Used: AM-PAC 6 Clicks Daily Activity OT General Charges $OT Visit: 1 Visit OT Treatments $Therapeutic Activity: 8-22 mins $Neuromuscular Re-education: 8-22 mins     Galen Manila 12/08/2017, 12:51 PM

## 2017-12-08 NOTE — Progress Notes (Signed)
Pt RR running 25-30 bpm. Pt seems agitated looks uncomfortable. Other VSS. Made NP on call. Ordered stat cxray, duoned q4h and x1 morphine.

## 2017-12-08 NOTE — Progress Notes (Signed)
  Speech Language Pathology Treatment: Dysphagia  Patient Details Name: Todd Sullivan MRN: [id number] DOB: 16-May-1956 Today's Date: 12/08/2017 Time: 3151-7616 SLP Time Calculation (min) (ACUTE ONLY): 13 min  Assessment / Plan / Recommendation Clinical Impression  Pt is more alert today but his mentation remains altered, including his awareness and sustained attention. SLP provided trials of advanced thin liquids, with immediate coughing suggestive of airway compromise that followed straw sip and large, self-fed cup sips. During FEES pt had consistent coughing with thin liquid penetrates and cleared them from his laryngeal vestibule, but his persistently altered mentation, strong/wet coughing, and current respiratory status still puts him at a higher risk for aspiration. I also worry that he would not tolerate aspiration very well given his more tenuous overall health. SLP provided Mod A for small, single cup sips of water, with no further coughing episodes noted across almost 4 ounces. Pt's vocal quality also remained clear. Given the above and also considering ways to increase pt's PO intake, recommend continuing Dys 2 diet and nectar thick liquids but allowing small, single sips of water in between meals. Pt's mouth should be cleaned prior to administering liquids, he should have full supervision, and trials should be stopped if coughing is noted. SLP will continue to follow for tolerance and potential to advance.   HPI HPI: 62 year old male with past medical history significant for COPD, asthma, diabetes, CAD status post STEMI/ stent 06/2017, hypertension, and CVA who presented via EMS with complaints of shortness of breath and altered mental status on 2/12. Intubated on arrival secondary to inability to protect airway. Extubated 2/25 upon pt wishes. CXR improving from admission, now showing There is a degree of pulmonary vascular congestion. Cardiomegaly is stable. Small left pleural effusion. Mild  bibasilar atelectasis. No consolidation. Central catheter tips in superior vena cava. No pneumothorax.       SLP Plan  Continue with current plan of care       Recommendations  Diet recommendations: Dysphagia 2 (fine chop);Nectar-thick liquid;Other(comment)(allow sips of water per water protocol) Liquids provided via: Cup Medication Administration: Whole meds with puree Supervision: Staff to assist with self feeding;Full supervision/cueing for compensatory strategies Compensations: Slow rate;Small sips/bites;Minimize environmental distractions Postural Changes and/or Swallow Maneuvers: Seated upright 90 degrees                Oral Care Recommendations: Oral care BID;Oral care prior to ice chip/H20 Follow up Recommendations: Skilled Nursing facility SLP Visit Diagnosis: Dysphagia, oropharyngeal phase (R13.12) Plan: Continue with current plan of care       GO                Maxcine Ham 12/08/2017, 4:10 PM  Maxcine Ham, M.A. CCC-SLP [phone]

## 2017-12-08 NOTE — Care Management Important Message (Signed)
Important Message  Patient Details  Name: Khamir Applin MRN: [id number] Date of Birth: 04-30-1956   Medicare Important Message Given:  Yes    Jaimee Corum 12/08/2017, 4:00 PM

## 2017-12-08 NOTE — Discharge Instructions (Signed)

## 2017-12-08 NOTE — Progress Notes (Signed)
Rehab admissions - Please see rehab consult done by Dr. Wynn Banker recommending SNF placement for therapies.  Does not have potential to make substantial gains in an acute rehab setting.  Call me for questions.  #[phone]

## 2017-12-08 NOTE — Progress Notes (Signed)
Subjective:  In good spirits. Smiles. Eating well.  1.9 L urine output/24 hrs without furosemide  Cr down to 1.00 today  UOsm 318. Ur Na 62 Suggest hypertonicity. Hypernatremia resolved, Na 144    Objective:  Vital Signs in the last 24 hours: Temp:  [98.3 F (36.8 C)-98.4 F (36.9 C)] 98.3 F (36.8 C) (03/01 0443) Pulse Rate:  [100-104] 104 (03/01 0854) Resp:  [22-30] 22 (03/01 0443) BP: (126-146)/(75-105) 126/80 (03/01 0854) SpO2:  [97 %-100 %] 100 % (03/01 0833) Weight:  [127.5 kg (281 lb)] 127.5 kg (281 lb) (03/01 0447)  Intake/Output from previous day: 02/28 0701 - 03/01 0700 In: 2712.2 [P.O.:1080; I.V.:1632.2] Out: 1925 [Urine:1925] Intake/Output from this shift: No intake/output data recorded.  Net -10.8 L  Physical Exam: Nursing note and vitals reviewed. Constitutional: He appears well-developed.  Generalized weakness. No focal deficit  HENT:  Head: Normocephalic.  Eyes: PEERL. No conjunctival discahrge Neck: Normal range of motion. Neck supple. JVD present.  Cardiovascular: Normal rate and regular rhythm.  Murmur (II/VI holosystolic murmur RLSB) heard. Distant heart sounds.   Respiratory: Effort normal.  Clear lungs, diminished breath sounds at bases  GI: Soft. Bowel sounds are normal. No distension Musculoskeletal: Normal range of motion. He exhibits edema.  Lymphadenopathy:    He has no cervical adenopathy.  Neurological:  Awake. Alert. Oriented X3. No focal deficit Skin: Skin is warm and dry.  Psychiatric: Appropriate affect. Smiles.    Lab Results: Recent Labs    12/07/17 0450 12/08/17 0737  WBC 9.6 10.1  HGB 11.8* 11.5*  PLT 467* 497*   Recent Labs    12/07/17 0450 12/08/17 0737  NA 148* 144  K 3.8 3.4*  CL 108 105  CO2 29 28  GLUCOSE 114* 130*  BUN 28* 21*  CREATININE 1.05 1.00   No results for input(s): TROPONINI in the last 72 hours.  Invalid input(s): CK, MB Hepatic Function Panel Recent Labs    12/07/17 0450  PROT  6.7  ALBUMIN 2.8*  AST 28  ALT 56  ALKPHOS 82  BILITOT 1.9*   Echocardiogram 11/23/2017 Study Conclusions   - Left ventricle: The cavity size was moderately dilated. Wall   thickness was increased in a pattern of mild LVH. Systolic   function was severely reduced. The estimated ejection fraction   was in the range of 10% to 15%. Diffuse hypokinesis. Akinesis of   the apical myocardium. The study is not technically sufficient to   allow evaluation of LV diastolic function. Acoustic contrast   opacification revealed a large, apicalthrombusassociated with an   akinetic segment. - Ventricular septum: The contour showed diastolic flattening and   systolic flattening. These changes are consistent with RV volume   and pressure overload. - Aortic valve: There was trivial regurgitation. - Mitral valve: There was mild to moderate regurgitation. - Left atrium: The atrium was severely dilated. - Right ventricle: The cavity size was severely dilated. Systolic   function was severely reduced. - Right atrium: The atrium was severely dilated. - Atrial septum: No defect or patent foramen ovale was identified. - Tricuspid valve: There was moderate regurgitation.  Assessment: Acute on chronic systolic and diastolic heart failure Nonischemic cardiomyopathy Apical thrombus CAD, single vessel disease s/p primary PCI to LCx Hypernatremia:  Resolved after stopping IV lasix 40 mg bid. Likely due to overdiuresis. AKI: Resolved Morbid obesity Cocaine abuse: UDS positive on admission CAP: Completed treatment Acute hypoxic failure: Resolved   Recommendations:  Continue bidil 1 tab of 20-37.5  mg tid and captopril 6.25 mg tid for afterload reduction Continue coreg 3.125 mg bid. Recommend 40 mg PO lasix maintenance dose. This can be changed to alternate day, should his sodium or creatinine increase. Further uptitration of GDMT for heart failure most likely outpatient.  Continue heparin/warfarin  bridge. Goal INR 2.0-3.0 Suspect he will need rehab  Will sign off at this time. Please call us back, if any questions.   LOS: 17 days    Todd Sullivan J Todd Sullivan 12/08/2017, 9:28 AM  Jenet Durio Emiliano Dyer, MD [facility] [place] Pager: [phone] Office: [phone] If no answer Cell [phone]

## 2017-12-08 NOTE — Consult Note (Signed)
Consultation Note Date: 12/08/2017   Patient Name: Todd Sullivan  DOB: [birth year]  MRN: [id number]  Age / Sex: 62 y.o., male  PCP: Leilani Able, MD Referring Physician: Rhetta Mura, MD  Reason for Consultation: Establishing goals of care and Psychosocial/spiritual support  HPI/Patient Profile: 62 y.o. male  with past medical history of CVA, hypertension, gout, diabetes, COPD, MI in September 2018, cocaine use disorder (uses crack cocaine approximately 3-4 times a week) congestive heart failure with an ejection fraction of 10-15% admitted on 11/25/17 with shortness of breath, altered mental status.  Patient required intubation for airway protection but self extubated on 12/04/2017.  He was found to have a left lower lobe pneumonia as well as a mural thrombus.  Per chart review, patient has always said he would not want a trach or intubation.  Consult ordered for goals of care.   Clinical Assessment and Goals of Care: Patient seen, chart reviewed.  Patient's son Todd Sullivan was at the bedside.  Mr. Klemz has 5 children, is unmarried but has a fianc.  Per son Todd Sullivan, patient would not want to live like this.  Patient's life is  about music.  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. 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  Todd Sullivan is still verbalizing hope for improvement although he does acknowledge she does not see that his father has much rehab potential because of the severity of his heart disease  Mr. Mcduffee did not designate 1 of his children as a primary healthcare power of attorney.  According to [facility], fall to the majority of the children at this point    SUMMARY OF RECOMMENDATIONS   Reaffirmed DNR  DNI Continue to treat the treatable.  Patient is not full comfort care.  This is likely something is going to need a little more time for family to recognize that patient is likely not a good rehab candidate I do feel like he would meet residential hospice criteria if he continues on this path Code Status/Advance Care Planning:  DNR    Symptom Management:   Dyspnea: Patient appears short of breath at rest.  Recommend adding low-dose morphine 1-2 mg every 2 hours as needed for shortness of breath  Pain: Continue with Tylenol for mild to moderate pain, ; consider adding low-dose morphine as needed  Delirium (hypoactive versus nearing death awareness): See that patient has a QTc interval of 494 but he does appear to be hallucinating in the room.  His son Todd Sullivan also has seen this.  Would recommend low-dose Zyprexa at 2.5-5 mg nightly.  I feel that benefits of this medication outweigh the risks.  His mental status also could be complicated by his years of crack cocaine usage  Palliative Prophylaxis:   Aspiration, Bowel Regimen, Delirium Protocol, Eye Care, Frequent Pain Assessment, Oral Care  and Turn Reposition  Additional Recommendations (Limitations, Scope, Preferences):  Avoid Hospitalization, Minimize Medications, No Artificial Feeding, No Hemodialysis, No Radiation, No Surgical Procedures and No Tracheostomy  Psycho-social/Spiritual:   Desire for further Chaplaincy support:no  Additional Recommendations: Grief/Bereavement Support  Prognosis:  < 2 weeks in the setting of end-stage heart failure with an EF of 10-15%, history of MI in September 2018, comorbidities of diabetes, COPD hypertension, gout, pleural effusions, pulmonary edema.  Patient is only taking bites and sips.  Patient appears to meet residential hospice criteria especially if things continue to decline at this rate Discharge Planning: To Be Determined      Primary Diagnoses: Present on Admission: . Respiratory  failure (HCC)   I have reviewed the medical record, interviewed the patient and family, and examined the patient. The following aspects are pertinent.  Past Medical History:  Diagnosis Date  . Anxiety   . Asthma   . CHF (congestive heart failure) (HCC)   . COPD (chronic obstructive pulmonary disease) (HCC)   . Depression   . Diabetes mellitus   . Gout   . Headache(784.0)   . Hypertension   . Shortness of breath   . Stroke [facility])    Social History   Socioeconomic History  . Marital status: Legally Separated    Spouse name: None  . Number of children: None  . Years of education: None  . Highest education level: None  Social Needs  . Financial resource strain: None  . Food insecurity - worry: None  . Food insecurity - inability: None  . Transportation needs - medical: None  . Transportation needs - non-medical: None  Occupational History  . None  Tobacco Use  . Smoking status: Current Every Day Smoker    Packs/day: 0.50    Years: 40.00    Pack years: 20.00    Types: Cigarettes  . Smokeless tobacco: Never Used  Substance and Sexual Activity  . Alcohol use: No  . Drug use: No  . Sexual activity: None  Other Topics Concern  . None  Social History Narrative  . None   Family History  Problem Relation Age of Onset  . Diabetes type II Mother   . Asthma Mother   . Diabetes type II Sister   . Asthma Sister   . Asthma Brother   . Asthma Son    Scheduled Meds: . captopril  6.25 mg Oral TID  . carvedilol  3.125 mg Oral BID WC  . clopidogrel  75 mg Oral Daily  . docusate sodium  100 mg Oral Daily  . furosemide  40 mg Oral Daily  . ipratropium-albuterol  3 mL Nebulization QID  . isosorbide-hydrALAZINE  1 tablet Oral TID  . multivitamin with minerals  1 tablet Oral Daily  . senna  1 tablet Oral Daily  . sodium chloride flush  10-40 mL Intracatheter Q12H  . sodium chloride flush  10-40 mL Intracatheter Q12H  . warfarin  5 mg Oral ONCE-1800  . Warfarin - Pharmacist  Dosing Inpatient   Does not apply q1800   Continuous Infusions: . sodium chloride 250 mL (12/02/17 0700)  . heparin 1,950 Units/hr (12/08/17 0850)   PRN Meds:.sodium chloride, acetaminophen, ipratropium-albuterol, RESOURCE THICKENUP CLEAR, sodium chloride flush, sodium chloride flush Medications Prior to Admission:  Prior to Admission medications   Medication Sig Start Date End Date Taking? Authorizing Provider  albuterol (PROVENTIL HFA;VENTOLIN HFA) 108 (90 BASE) MCG/ACT inhaler Inhale 2 puffs into the lungs every 4 (four) hours as needed.  For shortness of breath. 09/19/11  Yes Osvaldo Shipper, MD  atorvastatin (LIPITOR) 80 MG tablet Take 1 tablet (80 mg total) by mouth daily at 6 PM. 07/09/17  Yes Patwardhan, Manish J, MD  budesonide-formoterol (SYMBICORT) 160-4.5 MCG/ACT inhaler Inhale 2 puffs into the lungs 2 (two) times daily. 09/19/11  Yes Osvaldo Shipper, MD  colchicine 0.6 MG tablet Take 0.6 mg by mouth at bedtime.  06/02/17  Yes [provider]  cyclobenzaprine (FLEXERIL) 10 MG tablet Take 10 mg by mouth 2 (two) times daily.   Yes [provider]  diltiazem (TIAZAC) 180 MG 24 hr capsule Take 360 mg by mouth at bedtime.   Yes [provider]  furosemide (LASIX) 40 MG tablet Take 1 tablet (40 mg total) by mouth as needed. For leg swelling and shortness of breath 07/09/17  Yes Patwardhan, Manish J, MD  isosorbide-hydrALAZINE (BIDIL) 20-37.5 MG tablet Take 1 tablet by mouth 3 (three) times daily. 07/09/17  Yes Patwardhan, Manish J, MD  metFORMIN (GLUCOPHAGE) 500 MG tablet Take 500 mg by mouth 2 (two) times daily with a meal.   Yes [provider]  ticagrelor (BRILINTA) 90 MG TABS tablet Take 1 tablet (90 mg total) by mouth 2 (two) times daily. 07/09/17  Yes Patwardhan, Manish J, MD  carvedilol (COREG) 25 MG tablet Take 1 tablet (25 mg total) by mouth 2 (two) times daily with a meal. Patient not taking: Reported on 11-28-2017 01/04/13 11/22/23  Bensimhon, Bevelyn Buckles, MD  nicotine (NICODERM CQ - DOSED IN MG/24 HR) 7 mg/24hr patch Place 1 patch (7 mg total) onto the skin daily. Patient not taking: Reported on 11/28/17 07/09/17   Elder Negus, MD   Allergies  Allergen Reactions  . Banana     Sick   . Chlorhexidine Gluconate     Caused contact dermatitis on skin  . Egg Yolk Nausea And Vomiting  . Kiwi Extract Nausea And Vomiting   Review of Systems  Unable to perform ROS: Acuity of condition    Physical Exam  Constitutional: He appears well-developed and well-nourished.  Acutely ill-appearing older man; questionable hypoactive delirium versus nearing death awareness  HENT:  Head: Normocephalic and atraumatic.  Neck: Normal range of motion.  Cardiovascular: Normal rate.  Pulmonary/Chest: Effort normal.  Abdominal: Soft.  Musculoskeletal: Normal range of motion.  Neurological: He is alert.  Response to his name; speech is dysarthric difficult to understand  Skin: Skin is warm and dry.  Psychiatric:  Fluctuating level of consciousness Questionable hypoactive delirium versus nearing death awareness  Nursing note and vitals reviewed.   Vital Signs: BP 126/80   Pulse (!) 104   Temp 98.3 F (36.8 C) (Axillary)   Resp (!) 22   Ht 6\' 3"  (1.905 m)   Wt 127.5 kg (281 lb)   SpO2 100%   BMI 35.12 kg/m  Pain Assessment: PAINAD   Pain Score: Asleep   SpO2: SpO2: 100 % O2 Device:SpO2: 100 % O2 Flow Rate: .O2 Flow Rate (L/min): 1 L/min  IO: Intake/output summary:   Intake/Output Summary (Last 24 hours) at 12/08/2017 1217 Last data filed at 12/08/2017 0400 Gross per 24 hour  Intake 2352.19 ml  Output 825 ml  Net 1527.19 ml    LBM: Last BM Date: 12/01/17 Baseline Weight: Weight: 129.5 kg (285 lb 7.9 oz) Most recent weight: Weight: 127.5 kg (281 lb)     Palliative Assessment/Data:   Flowsheet Rows     Most Recent Value  Intake Tab  Referral  Department  Hospitalist  Unit at Time of Referral  Med/Surg Unit  Palliative  Care Primary Diagnosis  Cardiac  Date Notified  12/07/17  Palliative Care Type  New Palliative care  Reason for referral  Clarify Goals of Care  Date of Admission  12/15/17  Date first seen by Palliative Care  12/08/17  # of days Palliative referral response time  1 Day(s)  # of days IP prior to Palliative referral  16  Clinical Assessment  Palliative Performance Scale Score  30%  Pain Max last 24 hours  Not able to report  Pain Min Last 24 hours  Not able to report  Dyspnea Max Last 24 Hours  Not able to report  Dyspnea Min Last 24 hours  Not able to report  Nausea Max Last 24 Hours  Not able to report  Nausea Min Last 24 Hours  Not able to report  Anxiety Max Last 24 Hours  Not able to report  Anxiety Min Last 24 Hours  Not able to report  Other Max Last 24 Hours  Not able to report  Psychosocial & Spiritual Assessment  Palliative Care Outcomes  Patient/Family meeting held?  Yes  Who was at the meeting?  pt's son, [facility]  Palliative Care Outcomes  Provided psychosocial or spiritual support      Time In: 1100 Time Out: 1215 Time Total: 75 min Greater than 50%  of this time was spent counseling and coordinating care related to the above assessment and plan. Staffed with Dr. Mahala Menghini  Signed by: Irean Hong, NP   Please contact Palliative Medicine Team phone at [phone] for questions and concerns.  For individual provider: See Loretha Stapler

## 2017-12-08 DEATH — deceased

## 2017-12-09 LAB — COMPREHENSIVE METABOLIC PANEL
ALBUMIN: 2.8 g/dL — AB (ref 3.5–5.0)
ALK PHOS: 85 U/L (ref 38–126)
ALT: 45 U/L (ref 17–63)
ANION GAP: 11 (ref 5–15)
AST: 32 U/L (ref 15–41)
BUN: 17 mg/dL (ref 6–20)
CALCIUM: 9.1 mg/dL (ref 8.9–10.3)
CO2: 28 mmol/L (ref 22–32)
CREATININE: 1.05 mg/dL (ref 0.61–1.24)
Chloride: 103 mmol/L (ref 101–111)
GFR calc non Af Amer: >60 mL/min (ref >60)
GLUCOSE: 106 mg/dL — AB (ref 65–99)
Potassium: 3.5 mmol/L (ref 3.5–5.1)
SODIUM: 142 mmol/L (ref 135–145)
Total Bilirubin: 1.9 mg/dL — ABNORMAL HIGH (ref 0.3–1.2)
Total Protein: 6.7 g/dL (ref 6.5–8.1)

## 2017-12-09 LAB — CBC
HEMATOCRIT: 37.9 % — AB (ref 39.0–52.0)
Hemoglobin: 11.7 g/dL — ABNORMAL LOW (ref 13.0–17.0)
MCH: 25.6 pg — ABNORMAL LOW (ref 26.0–34.0)
MCHC: 30.9 g/dL (ref 30.0–36.0)
MCV: 82.9 fL (ref 78.0–100.0)
Platelets: 559 10*3/uL — ABNORMAL HIGH (ref 150–400)
RBC: 4.57 MIL/uL (ref 4.22–5.81)
RDW: 22.7 % — ABNORMAL HIGH (ref 11.5–15.5)
WBC: 10.3 10*3/uL (ref 4.0–10.5)

## 2017-12-09 LAB — GLUCOSE, CAPILLARY
GLUCOSE-CAPILLARY: 102 mg/dL — AB (ref 65–99)
GLUCOSE-CAPILLARY: 107 mg/dL — AB (ref 65–99)
GLUCOSE-CAPILLARY: 136 mg/dL — AB (ref 65–99)
Glucose-Capillary: 122 mg/dL — ABNORMAL HIGH (ref 65–99)
Glucose-Capillary: 106 mg/dL — ABNORMAL HIGH (ref 65–99)
Glucose-Capillary: 91 mg/dL (ref 65–99)

## 2017-12-09 LAB — AMMONIA: Ammonia: 81 umol/L — ABNORMAL HIGH (ref 9–35)

## 2017-12-09 LAB — PROTIME-INR
INR: 2.08
Prothrombin Time: 23.2 seconds — ABNORMAL HIGH (ref 11.4–15.2)

## 2017-12-09 LAB — HEPARIN LEVEL (UNFRACTIONATED)
Heparin Unfractionated: 0.22 IU/mL — ABNORMAL LOW (ref 0.30–0.70)
Heparin Unfractionated: 0.33 IU/mL (ref 0.30–0.70)

## 2017-12-09 MED ORDER — WARFARIN SODIUM 7.5 MG PO TABS
7.5000 mg | ORAL_TABLET | Freq: Once | ORAL | Status: DC
  Filled 2017-12-09: qty 1

## 2017-12-09 MED ORDER — IPRATROPIUM-ALBUTEROL 0.5-2.5 (3) MG/3ML IN SOLN
3.0000 mL | Freq: Three times a day (TID) | RESPIRATORY_TRACT | Status: DC
  Administered 2017-12-09 (×2): 3 mL via RESPIRATORY_TRACT
  Filled 2017-12-09 (×3): qty 3

## 2017-12-09 MED ORDER — SODIUM CHLORIDE 0.9% FLUSH: 10.0000 mL | INTRAVENOUS | Status: DC | PRN

## 2017-12-09 NOTE — Progress Notes (Signed)
Telemetry called for 5 beats of vtach, pt alert and responsive , no complain of pain, family at the bedside, asymptomatic, will continue to monitor, v/s stable and assessed.

## 2017-12-09 NOTE — Progress Notes (Signed)
Daily Progress Note   Patient Name: Todd Sullivan       Date: 12/09/2017 DOB: 1955/11/19  Age: 62 y.o. MRN#: [id number] Attending Physician: Rhetta Mura, MD Primary Care Physician: Leilani Able, MD Admit Date: 11/23/2017  Reason for Consultation/Follow-up: Establishing goals of care, Hospice Evaluation and Psychosocial/spiritual support  Subjective: Patient seen, chart reviewed.  Patient's fianc, Campbell Lerner, is at the bedside.  She describes that patient has had a very restless night, he is frustrated with not being able to be understood, trying to climb out of the bed.  Patient has 4 children and no designated healthcare power of attorney in addition to [employer].  Campbell Lerner shares they are in the process of trying to put together a plan in order to bring patient home  Length of Stay: 18  Current Medications: Scheduled Meds:  . captopril  6.25 mg Oral TID  . carvedilol  3.125 mg Oral BID WC  . clopidogrel  75 mg Oral Daily  . docusate sodium  100 mg Oral Daily  . furosemide  40 mg Oral Daily  . ipratropium-albuterol  3 mL Nebulization TID  . isosorbide-hydrALAZINE  1 tablet Oral TID  . multivitamin with minerals  1 tablet Oral Daily  . OLANZapine  2.5 mg Oral Daily  . senna  1 tablet Oral Daily  . sodium chloride flush  10-40 mL Intracatheter Q12H  . sodium chloride flush  10-40 mL Intracatheter Q12H  . warfarin  7.5 mg Oral ONCE-1800  . Warfarin - Pharmacist Dosing Inpatient   Does not apply q1800    Continuous Infusions: . sodium chloride 250 mL (12/02/17 0700)  . heparin 1,950 Units/hr (12/08/17 0850)    PRN Meds: sodium chloride, acetaminophen, ipratropium-albuterol, morphine, RESOURCE THICKENUP CLEAR, sodium chloride flush, sodium chloride flush  Physical Exam    Constitutional: He appears well-developed.  Acutely ill-appearing older man Vocal quality impaired  HENT:  Head: Normocephalic and atraumatic.  Cardiovascular: Normal rate.  Pulmonary/Chest:  Increased work of breathing at rest  Musculoskeletal: Normal range of motion.  Skin: Skin is warm and dry.  Psychiatric:  Patient agitated, frustrated Trying to climb out of the bed  Nursing note and vitals reviewed.           Vital Signs: BP (!) 130/101   Pulse (!) 104   Temp 98.5  F (36.9 C)   Resp 20   Ht 6\' 3"  (1.905 m)   Wt 128.6 kg (283 lb 8.2 oz)   SpO2 99%   BMI 35.44 kg/m  SpO2: SpO2: 99 % O2 Device: O2 Device: Room Air O2 Flow Rate: O2 Flow Rate (L/min): 2 L/min  Intake/output summary:   Intake/Output Summary (Last 24 hours) at 12/09/2017 1043 Last data filed at 12/09/2017 0406 Gross per 24 hour  Intake 551 ml  Output 1650 ml  Net -1099 ml   LBM: Last BM Date: 12/04/17 Baseline Weight: Weight: 129.5 kg (285 lb 7.9 oz) Most recent weight: Weight: 128.6 kg (283 lb 8.2 oz)       Palliative Assessment/Data:    Flowsheet Rows     Most Recent Value  Intake Tab  Referral Department  Hospitalist  Unit at Time of Referral  Med/Surg Unit  Palliative Care Primary Diagnosis  Cardiac  Date Notified  12/07/17  Palliative Care Type  New Palliative care  Reason for referral  Clarify Goals of Care  Date of Admission  11/20/2017  Date first seen by Palliative Care  12/08/17  # of days Palliative referral response time  1 Day(s)  # of days IP prior to Palliative referral  16  Clinical Assessment  Palliative Performance Scale Score  30%  Pain Max last 24 hours  Not able to report  Pain Min Last 24 hours  Not able to report  Dyspnea Max Last 24 Hours  Not able to report  Dyspnea Min Last 24 hours  Not able to report  Nausea Max Last 24 Hours  Not able to report  Nausea Min Last 24 Hours  Not able to report  Anxiety Max Last 24 Hours  Not able to report  Anxiety Min Last 24  Hours  Not able to report  Other Max Last 24 Hours  Not able to report  Psychosocial & Spiritual Assessment  Palliative Care Outcomes  Patient/Family meeting held?  Yes  Who was at the meeting?  pt's son, [facility]  Palliative Care Outcomes  Provided psychosocial or spiritual support      Patient Active Problem List   Diagnosis Date Noted  . Goals of care, counseling/discussion   . Palliative care by specialist   . Pressure injury of skin 11/28/2017  . Altered mental status   . Respiratory failure (HCC) 11/20/2017  . STEMI (ST elevation myocardial infarction) (HCC) 07/06/2017  . Hyperosmolar non-ketotic state in patient with type 2 diabetes mellitus (HCC) 02/16/2013  . ARF (acute renal failure) (HCC) 02/16/2013  . Dehydration 02/16/2013  . Chronic systolic heart failure (HCC) 05/06/2012  . Obesities, morbid (HCC) 09/15/2011  . VTE (venous thromboembolism) 09/15/2011  . Gout attack 09/15/2011  . Nicotine abuse 09/15/2011  . Systolic and diastolic CHF, acute on chronic (HCC) 09/15/2011  . Acute respiratory failure (HCC) 09/14/2011  . COPD exacerbation (HCC) 09/14/2011  . EMPHYSEMA 08/13/2009  . DM 07/30/2009  . HYPERLIPIDEMIA 07/30/2009  . HYPERTENSION 07/30/2009  . CHF (congestive heart failure) (HCC) 07/30/2009  . STROKE 07/30/2009  . ALLERGIC RHINITIS 07/30/2009  . DYSPNEA 07/30/2009    Palliative Care Assessment & Plan   Patient Profile: 62 y.o. male  with past medical history of CVA, hypertension, gout, diabetes, COPD, MI in September 2018, cocaine use disorder (uses crack cocaine approximately 3-4 times a week) congestive heart failure with an ejection fraction of 10-15% admitted on 11/13/2017 with shortness of breath, altered mental status.  Patient required intubation for airway  protection but self extubated on 12/04/2017.  He was found to have a left lower lobe pneumonia as well as a mural thrombus.  Per chart review, patient has always said he would not want a trach or  intubation.  Consult ordered for goals of care.    Assessment: Presented several options to Janine to consider going forward  Short-term rehab at skilled nursing facility  Home with hospice or home health after rehab  Introduced topic of residential hospice Per Campbell Lerner, his children were unhappy that he had chosen DNR status, and she feels that they would all be leaning towards short-term rehab (understanding that the likelihood of returning to his prior baseline is very unlikely; rehab probably only for short-term secondary to his degree of heart failure), then go home with hospice   Recommendations/Plan:  Palliative medicine to stay involved and continue to meet with family family to finalize disposition plans  Patient's fianc, who is not a Management consultant, has had negative experiences with hospice but  seemed receptive to hospice coming to the home.  She is not sure how his children will feel about that  There are many psychosocial issues particularly patient's housing which is very substandard, has mold and no central heat or air.  Skilled nursing placement may allow family time to make other arrangements as well as make arrangements for other family members to help take care of him at home  I believe the case could be made that he qualifies for residential hospice given his current level of debility, lack of appetite as well as degree of heart failure.  This would need to be discussed further with family.  Goals of Care and Additional Recommendations:  Limitations on Scope of Treatment: Full Scope Treatment except for DNR/DNI  Code Status:    Code Status Orders  (From admission, onward)        Start     Ordered   12/07/17 1514  Do not attempt resuscitation (DNR)  Continuous    Question Answer Comment  In the event of cardiac or respiratory ARREST Do not call a "code blue"   In the event of cardiac or respiratory ARREST Do not perform Intubation, CPR, defibrillation or  ACLS   In the event of cardiac or respiratory ARREST Use medication by any route, position, wound care, and other measures to relive pain and suffering. May use oxygen, suction and manual treatment of airway obstruction as needed for comfort.      12/07/17 1515    Code Status History    Date Active Date Inactive Code Status Order ID Comments User Context   12/04/2017 15:17 12/07/2017 15:15 DNR [id number]  Alyson Reedy, MD Inpatient   11-24-2017 08:10 12/04/2017 15:17 Full Code [id number]  Minor, Vilinda Blanks, NP ED   07/07/2017 00:01 07/09/2017 13:41 Full Code [id number]  Elder Negus, MD Inpatient   02/16/2013 16:41 02/19/2013 17:02 Full Code [id number]  Maretta Bees, MD Inpatient   09/15/2011 00:08 09/19/2011 21:29 Full Code [id number]  Natividad Brood, RN Inpatient       Prognosis:  Patient is critically ill with heart failure with an ejection fraction of 10-15%, market debility, cocaine use disorder.  He has dysphasia, only eating bites and sips.  He could potentially have a prognosis of just weeks to live and may qualify for residential hospice if family was interested in this  Discharge Planning:  To Be Determined    Thank you for allowing the Palliative Medicine Team to assist  in the care of this patient.   Time In: 0930 Time Out: 1000 Total Time 30 min Prolonged Time Billed  no       Greater than 50%  of this time was spent counseling and coordinating care related to the above assessment and plan.  Irean Hong, NP  Please contact Palliative Medicine Team phone at [phone] for questions and concerns.

## 2017-12-09 NOTE — Progress Notes (Signed)
Upon shift assessment of patient noticed that PICC line was out of patients arm. Notified MD, pharmacy, and IV team. Will continue to monitor.

## 2017-12-09 NOTE — Progress Notes (Signed)
ANTICOAGULATION CONSULT NOTE   Pharmacy Consult for Heparin and Coumadin Indication: new apical thrombus  Allergies  Allergen Reactions  . Banana     Sick   . Chlorhexidine Gluconate     Caused contact dermatitis on skin  . Egg Yolk Nausea And Vomiting  . Kiwi Extract Nausea And Vomiting   Patient Measurements: Height: 6\' 3"  (190.5 cm) Weight: 283 lb 8.2 oz (128.6 kg) IBW/kg (Calculated) : 84.5 Heparin Dosing Weight: 116 kg  Assessment: 62 y.o. male on heparin gtt for new mural thrombus noted on ECHO. Coumadin restarted on 2/27. INR today is up to 2.0. Morning heparin level below goal at 0.22. Hgb 11.  No bleeding issues noted.  Goal of Therapy:  Heparin level 0.3-0.7 units/ml  INR 2.0-3.0 Monitor platelets by anticoagulation protocol: Yes   Plan:  Increase heparin gtt to 2100 units/hr  Give Coumadin 7.5mg  po x 1 tonight Monitor daily INR / heparin level, CBC, s/s of bleed  Sheppard Coil PharmD., BCPS Clinical Pharmacist 12/09/2017 4:54 AM

## 2017-12-09 NOTE — Progress Notes (Signed)
ANTICOAGULATION CONSULT NOTE   Pharmacy Consult for Heparin and Coumadin Indication: new apical thrombus  Allergies  Allergen Reactions  . Banana     Sick   . Chlorhexidine Gluconate     Caused contact dermatitis on skin  . Egg Yolk Nausea And Vomiting  . Kiwi Extract Nausea And Vomiting   Patient Measurements: Height: 6\' 3"  (190.5 cm) Weight: 283 lb 8.2 oz (128.6 kg) IBW/kg (Calculated) : 84.5 Heparin Dosing Weight: 116 kg  Assessment: 62 y.o. male on heparin gtt for new mural thrombus noted on ECHO. Coumadin restarted on 2/27. INR today is up to 2.08 after 3rd dose. Heparin level now goal at 0.33. CBC stable. No bleed documented.  Scheduled to received Coumadin 7.5mg  PO x 1 tonight  Goal of Therapy:  Heparin level 0.3-0.7 units/ml  INR 2.0-3.0 Monitor platelets by anticoagulation protocol: Yes   Plan:  Continue heparin gtt at 2100 units/hr  Confirm therapeutic with AM labs. Monitor daily INR / heparin level, CBC, s/s of bleed  Babs Bertin, PharmD, BCPS Clinical Pharmacist 12/09/2017 7:10 PM

## 2017-12-09 NOTE — Progress Notes (Signed)
PROGRESS NOTE   Todd Sullivan  WGN:[id number]    DOB: 03-Nov-1955    DOA: 11/26/2017  PCP: Leilani Able, MD   I have briefly reviewed patients previous medical records in [facility].  Brief Narrative:  CCM to [facility] transfer 12/06/17. 53 male COPD, asthma, DM,  CAD status post STEMI/stent 06/2017, HTN, CVA who presented via EMS with complaints of dyspnea and altered mental status. He apparently had not done well since his MI 3 months ago.  He had not taken his medications for 5 days PTA.  He was found after a fall out of bed. In ED, unable to protect airway and hence intubated.  He was also hypoglycemic and placed on D10 drip.  He was admitted to ICU by CCM.  Extubated 2/26.   Assessment & Plan:   Active Problems:   CHF (congestive heart failure) (HCC)   Acute respiratory failure (HCC)   Respiratory failure (HCC)   Altered mental status   Pressure injury of skin   Goals of care, counseling/discussion   Palliative care by specialist  Acute respiratory failure secondary to pneumonia, pulmonary edema-intubated on admission 2/12, extubated 2/26, continue high flow nasal oxygen 4 L  Left lower lobe pneumonia on admission-completed antibiotics 2/18, tracheal aspirate showed no findings  Nonischemic cardiomyopathy EF 10-15% 2/14 Cardiology saw patient placed on Coreg 3.125 twice daily captopril 6.25 3 times daily and BiDil Had prior MI 07/06/2017 and angioplasty continue Plavix  Mural thrombus: Presently on IV heparin.  Initiated Coumadin per pharmacy.  Minimal hematuria noted, monitor closely.  INR currently 2.0 so we will consider transition to Coumadin if able to take  Obesity/Body mass index is 35.44 kg/m.  Acute toxic metabolic encephalopathy: Unclear as to his baseline mental status.  CT head 12/03/2017: No acute intracranial abnormalities.  ABG 12/04/17 without CO2 retention.  No focal deficits on clinical exam. Minimized sedative medications and monitor closely.  Recent  AKI and current hypernatremia may be contributing.  As per discussion with CCM 2/27, patient was somnolent with waxing and waning alertness even 2/26 and this does not appear to be new.  CCM also indicated that they had extensive discussion with patient's family regarding overall poor prognosis and patient had clarified that he did not want aggressive measures including intubation or tracheostomy. Starting Zyprexa 2.5 daily for metabolic encephalopathy with some hallucinations He is still not clear but following some commands-I will get pneumonia today and see if there is anything that might be causing this-his delirium is multifactorial at this time and I have explained to the family that this may resolve but it might not  Acute renal failure: Presumed cardiorenal syndrome.  Resolved.  Hypernatremia: Unclear etiology.  Initially in the 150 range and now better with IV Lasix and currently is on Lasix 40 mg daily  Transaminitis:?  Related to hepatic congestion.  Hepatitis panel negative.  RUQ ultrasound without evidence of acute cholecystitis.  Suspected shock liver.  Much improved. Rpt LFT's in 3/2 were negative and felt secondary to shock liver no further workup getting ammonia as above   Constipation:   Continue bowel regimen.  Type II DM: A1c 6.5.  On SSI.  Controlled between 89 and 106  Cocaine abuse: UDS positive for cocaine-Uses Crack at home, unclear if this has affected mentation long term  Normocytic anemia: Relatively stable.  Follow CBCs closely.  Dysphagia: On a modified diet as per speech therapy.  Adult failure to thrive: Multifactorial.  Patient very ill and has poor  overall prognosis.  Palliative care consulted for goals of care.   DVT prophylaxis: Currently on IV heparin drip.  Coumadin per pharmacy initiated. Code Status: DNR.  Confirmed with CCM MD. Family Communication: No family at the bedside currently Disposition: To be determined.   Consultants:  CCM-signed off  2/26 Cardiology Rehab MD  Procedures:  Foley catheter RUE PICC line Extubated 2/26  Antimicrobials:  Completed course   Subjective:  Awake but not alert thrashing around nursing reports pulling at telemetry monitors Cannot get a history out of him but does follow some commands   Objective:  Vitals:   12/08/17 2056 12/08/17 2123 12/09/17 0406 12/09/17 0819  BP:  119/74  (!) 135/96  Pulse:  100 (!) 108 (!) 104  Resp:  17 20 20   Temp:  98.4 F (36.9 C) 98.3 F (36.8 C) 98.5 F (36.9 C)  TempSrc:  Oral    SpO2: 98% 100% 96% 99%  Weight:   128.6 kg (283 lb 8.2 oz)   Height:        Examination:  Unkempt cannot verbalize and seems to be confused S1-S2 no murmur rub or gallop Abdomen soft nontender no rebound Chest clinically clear without added sound Lower extremities seem to have darkening of the lower extremity skin Sacral decubitus not visualized .Marland Kitchen   Data Reviewed: I have personally reviewed following labs and imaging studies  CBC: Recent Labs  Lab 12/04/17 0400 12/05/17 0428 12/06/17 0334 12/07/17 0450 12/08/17 0737 12/09/17 0306  WBC 9.5 10.3 8.1 9.6 10.1 10.3  NEUTROABS 4.7  --   --   --   --   --   HGB 11.6* 11.8* 10.8* 11.8* 11.5* 11.7*  HCT 37.5* 37.9* 36.2* 38.6* 37.9* 37.9*  MCV 82.6 82.9 84.8 84.8 84.4 82.9  PLT 220 292 408* 467* 497* 559*   Basic Metabolic Panel: Recent Labs  Lab 12/04/17 0400 12/05/17 0428 12/06/17 0334 12/07/17 0450 12/08/17 0737 12/09/17 0306  NA 150* 149* 149* 148* 144 142  K 3.7 3.3* 4.3 3.8 3.4* 3.5  CL 113* 108 109 108 105 103  CO2 28 28 29 29 28 28   GLUCOSE 138* 87 101* 114* 130* 106*  BUN 74* 55* 43* 28* 21* 17  CREATININE 1.24 1.17 1.16 1.05 1.00 1.05  CALCIUM 8.6* 8.9 8.9 9.1 9.0 9.1  MG 2.1 1.9 1.9  --   --   --   PHOS  --  3.7 3.4  --   --   --    Liver Function Tests: Recent Labs  Lab 12/07/17 0450 12/09/17 0306  AST 28 32  ALT 56 45  ALKPHOS 82 85  BILITOT 1.9* 1.9*  PROT 6.7 6.7    ALBUMIN 2.8* 2.8*   Coagulation Profile: Recent Labs  Lab 12/07/17 0450 12/08/17 0737 12/09/17 0306  INR 1.56 1.84 2.08   Cardiac Enzymes: No results for input(s): CKTOTAL, CKMB, CKMBINDEX, TROPONINI in the last 168 hours. HbA1C: No results for input(s): HGBA1C in the last 72 hours. CBG: Recent Labs  Lab 12/08/17 0809 12/08/17 1312 12/08/17 1626 12/08/17 1931 12/09/17 0737  GLUCAP 112* 100* 103* 89 106*    Recent Results (from the past 240 hour(s))  Culture, respiratory (NON-Expectorated)     Status: None   Collection Time: 12/03/17  1:35 PM  Result Value Ref Range Status   Specimen Description TRACHEAL ASPIRATE  Final   Special Requests NONE  Final   Gram Stain   Final    RARE WBC PRESENT, PREDOMINANTLY MONONUCLEAR  RARE SQUAMOUS EPITHELIAL CELLS PRESENT NO ORGANISMS SEEN    Culture   Final    FEW Consistent with normal respiratory flora. Performed at [facility], [address] [place], [place] [address]    Report Status 12/06/2017 FINAL  Final    Radiology Studies: Dg Chest Port 1 View  Result Date: 12/08/2017 CLINICAL DATA:  Shortness of Breath EXAM: PORTABLE CHEST 1 VIEW COMPARISON:  12/07/2017 FINDINGS: Right PICC line now courses into the left innominate vein. Cardiomegaly with vascular congestion. No visible significant effusions or acute bony abnormality. IMPRESSION: Cardiomegaly, vascular congestion. Right PICC line courses into the left innominate vein. Electronically Signed   By: Charlett Nose M.D.   On: 12/08/2017 01:28   Scheduled Meds: . captopril  6.25 mg Oral TID  . carvedilol  3.125 mg Oral BID WC  . clopidogrel  75 mg Oral Daily  . docusate sodium  100 mg Oral Daily  . furosemide  40 mg Oral Daily  . ipratropium-albuterol  3 mL Nebulization QID  . isosorbide-hydrALAZINE  1 tablet Oral TID  . multivitamin with minerals  1 tablet Oral Daily  . OLANZapine  2.5 mg Oral Daily  . senna  1 tablet Oral Daily  . sodium chloride flush  10-40  mL Intracatheter Q12H  . sodium chloride flush  10-40 mL Intracatheter Q12H  . warfarin  7.5 mg Oral ONCE-1800  . Warfarin - Pharmacist Dosing Inpatient   Does not apply q1800   Continuous Infusions: . sodium chloride 250 mL (12/02/17 0700)  . heparin 1,950 Units/hr (12/08/17 0850)     LOS: 18 days     Pleas Koch, MD [facility]   If 7PM-7AM, please contact night-coverage [email or web address] Password [facility] 12/09/2017, 9:17 AM

## 2017-12-10 LAB — CBC
HEMATOCRIT: 35.9 % — AB (ref 39.0–52.0)
HEMOGLOBIN: 11.3 g/dL — AB (ref 13.0–17.0)
MCH: 26 pg (ref 26.0–34.0)
MCHC: 31.5 g/dL (ref 30.0–36.0)
MCV: 82.5 fL (ref 78.0–100.0)
Platelets: 592 10*3/uL — ABNORMAL HIGH (ref 150–400)
RBC: 4.35 MIL/uL (ref 4.22–5.81)
RDW: 22.1 % — ABNORMAL HIGH (ref 11.5–15.5)
WBC: 10.9 10*3/uL — AB (ref 4.0–10.5)

## 2017-12-10 LAB — GLUCOSE, CAPILLARY
GLUCOSE-CAPILLARY: 97 mg/dL (ref 65–99)
Glucose-Capillary: 107 mg/dL — ABNORMAL HIGH (ref 65–99)
Glucose-Capillary: 121 mg/dL — ABNORMAL HIGH (ref 65–99)
Glucose-Capillary: 83 mg/dL (ref 65–99)
Glucose-Capillary: 86 mg/dL (ref 65–99)
Glucose-Capillary: 94 mg/dL (ref 65–99)

## 2017-12-10 LAB — PROTIME-INR
INR: 2.17
PROTHROMBIN TIME: 24 s — AB (ref 11.4–15.2)

## 2017-12-10 LAB — HEPARIN LEVEL (UNFRACTIONATED)

## 2017-12-10 MED ORDER — IPRATROPIUM-ALBUTEROL 0.5-2.5 (3) MG/3ML IN SOLN: 3.0000 mL | Freq: Four times a day (QID) | RESPIRATORY_TRACT | Status: DC | PRN

## 2017-12-10 MED ORDER — LACTULOSE 10 GM/15ML PO SOLN
20.0000 g | Freq: Three times a day (TID) | ORAL | Status: DC
  Administered 2017-12-10 – 2017-12-11 (×5): 20 g via ORAL
  Filled 2017-12-10 (×5): qty 30

## 2017-12-10 MED ORDER — WARFARIN SODIUM 7.5 MG PO TABS
7.5000 mg | ORAL_TABLET | ORAL | Status: AC
  Administered 2017-12-10: 7.5 mg via ORAL
  Filled 2017-12-10: qty 1

## 2017-12-10 NOTE — Progress Notes (Signed)
PROGRESS NOTE   Todd Sullivan  IAX:[id number]    DOB: 1956-09-22    DOA: 12/01/2017  PCP: Leilani Able, MD   I have briefly reviewed patients previous medical records in [facility].  Brief Narrative:  CCM to [facility] transfer 12/06/17. 28 male COPD, asthma, DM,  CAD status post STEMI/stent 06/2017, HTN, CVA who presented via EMS with complaints of dyspnea and altered mental status. He apparently had not done well since his MI 3 months ago.  He had not taken his medications for 5 days PTA.  He was found after a fall out of bed. In ED, unable to protect airway and hence intubated.  He was also hypoglycemic and placed on D10 drip.  He was admitted to ICU by CCM.  Extubated 2/26.   Assessment & Plan:   Active Problems:   Acute decompensated heart failure (HCC)   Acute respiratory failure (HCC)   Respiratory failure (HCC)   Altered mental status   Pressure injury of skin   Goals of care, counseling/discussion   Palliative care by specialist  Acute respiratory failure secondary to pneumonia, pulmonary edema-intubated on admission 2/12, extubated 2/26, continue high flow nasal oxygen 4 L  Left lower lobe pneumonia on admission-completed antibiotics 2/18, tracheal aspirate showed no findings  Nonischemic cardiomyopathy EF 10-15% 2/14 Cardiology saw patient placed on Coreg 3.125 twice daily captopril 6.25 3 times daily and BiDil Had prior MI 07/06/2017 and angioplasty continue Plavix  Mural thrombus:   Initiated Coumadin per pharmacy.  Minimal hematuria noted, monitor closely.  INR currently 2.0 --heparin d/c 3/3  Obesity/Body mass index is 35.6 kg/m.  Acute toxic metabolic encephalopathy: Unclear as to his baseline mental status.  CT head 11/15/2017: No acute intracranial abnormalities.  ABG 12/04/17 without CO2 retention.  No focal deficits on clinical exam. Minimized sedative medications and monitor closely.  Recent AKI and current hypernatremia may be contributing.  As per  discussion with CCM 2/27, patient was somnolent with waxing and waning alertness even 2/26 and this does not appear to be new.  CCM also indicated that they had extensive discussion with patient's family regarding overall poor prognosis and patient had clarified that he did not want aggressive measures including intubation or tracheostomy. Starting Zyprexa 2.5 daily for metabolic encephalopathy with some hallucinations He is still not clear but following some commands-ammonia 81 Starting lactulose 3/3 If no improvement I have encouraged family to discuss and decide on dispo-SNf wth Palliative likely best dispo, await Palliative furthe rinput  Acute renal failure: Presumed cardiorenal syndrome.  Resolved.  Hypernatremia: Unclear etiology.  Initially in the 150 range and now better with IV Lasix and currently is on Lasix 40 mg daily  Transaminitis:?  Related to hepatic congestion.  Hepatitis panel negative.  RUQ ultrasound without evidence of acute cholecystitis.  Suspected shock liver.  Much improved. Rpt LFT's in 3/2 were negative and felt secondary to shock liver  Constipation:   Continue bowel regimen.  Type II DM: A1c 6.5.  On SSI.  Controlled between 89 and 106  Cocaine abuse: UDS positive for cocaine-Uses Crack at home, unclear if this has affected mentation long term  Normocytic anemia: Relatively stable.  Follow CBCs closely.  Dysphagia: On a modified diet as per speech therapy.  Adult failure to thrive: Multifactorial.  Patient very ill and has poor overall prognosis.  Palliative care consulted for goals of care.   DVT prophylaxis: Currently on IV heparin drip.  Coumadin per pharmacy initiated. Code Status: DNR.  Confirmed  with CCM MD. Family Communication: No family at the bedside currently Disposition: To be determined.   Consultants:  CCM-signed off 2/26 Cardiology Rehab MD  Procedures:  Foley catheter RUE PICC line Extubated 2/26  Antimicrobials:  Completed  course   Subjective:  Awake but confused Son bedside No meaningful verall changes to mentation   Objective:  Vitals:   12/09/17 2108 12/09/17 2246 12/10/17 0424 12/10/17 1352  BP:  128/71 134/88 130/72  Pulse: (!) 108 (!) 105 (!) 105 100  Resp:  20 20 17   Temp:  98.4 F (36.9 C) 98.6 F (37 C) 98.2 F (36.8 C)  TempSrc:  Axillary  Oral  SpO2: 98% 100% 100% 92%  Weight:   129.2 kg (284 lb 13.4 oz)   Height:        Examination:  Unkempt confused but motions--sparse episodic lucdity S1-S2 no murmur rub or gallop Abdomen soft nontender no rebound Chest clinically clear without added sound Lower extremities seem to have darkening of the lower extremity skin Sacral decubitus not visualized .Marland Kitchen   Data Reviewed: I have personally reviewed following labs and imaging studies  CBC: Recent Labs  Lab 12/04/17 0400  12/06/17 0334 12/07/17 0450 12/08/17 0737 12/09/17 0306 12/10/17 0758  WBC 9.5   < > 8.1 9.6 10.1 10.3 10.9*  NEUTROABS 4.7  --   --   --   --   --   --   HGB 11.6*   < > 10.8* 11.8* 11.5* 11.7* 11.3*  HCT 37.5*   < > 36.2* 38.6* 37.9* 37.9* 35.9*  MCV 82.6   < > 84.8 84.8 84.4 82.9 82.5  PLT 220   < > 408* 467* 497* 559* 592*   < > = values in this interval not displayed.   Basic Metabolic Panel: Recent Labs  Lab 12/04/17 0400 12/05/17 0428 12/06/17 0334 12/07/17 0450 12/08/17 0737 12/09/17 0306  NA 150* 149* 149* 148* 144 142  K 3.7 3.3* 4.3 3.8 3.4* 3.5  CL 113* 108 109 108 105 103  CO2 28 28 29 29 28 28   GLUCOSE 138* 87 101* 114* 130* 106*  BUN 74* 55* 43* 28* 21* 17  CREATININE 1.24 1.17 1.16 1.05 1.00 1.05  CALCIUM 8.6* 8.9 8.9 9.1 9.0 9.1  MG 2.1 1.9 1.9  --   --   --   PHOS  --  3.7 3.4  --   --   --    Liver Function Tests: Recent Labs  Lab 12/07/17 0450 12/09/17 0306  AST 28 32  ALT 56 45  ALKPHOS 82 85  BILITOT 1.9* 1.9*  PROT 6.7 6.7  ALBUMIN 2.8* 2.8*   Coagulation Profile: Recent Labs  Lab 12/07/17 0450  12/08/17 0737 12/09/17 0306 12/10/17 0758  INR 1.56 1.84 2.08 2.17   Cardiac Enzymes: No results for input(s): CKTOTAL, CKMB, CKMBINDEX, TROPONINI in the last 168 hours. HbA1C: No results for input(s): HGBA1C in the last 72 hours. CBG: Recent Labs  Lab 12/09/17 1954 12/10/17 0006 12/10/17 0421 12/10/17 0808 12/10/17 1147  GLUCAP 91 94 97 86 121*    Recent Results (from the past 240 hour(s))  Culture, respiratory (NON-Expectorated)     Status: None   Collection Time: 12/03/17  1:35 PM  Result Value Ref Range Status   Specimen Description TRACHEAL ASPIRATE  Final   Special Requests NONE  Final   Gram Stain   Final    RARE WBC PRESENT, PREDOMINANTLY MONONUCLEAR RARE SQUAMOUS EPITHELIAL CELLS PRESENT NO ORGANISMS  SEEN    Culture   Final    FEW Consistent with normal respiratory flora. Performed at [facility], [address] [place], [place] [address]    Report Status 12/06/2017 FINAL  Final    Radiology Studies: No results found. Scheduled Meds: . captopril  6.25 mg Oral TID  . carvedilol  3.125 mg Oral BID WC  . clopidogrel  75 mg Oral Daily  . docusate sodium  100 mg Oral Daily  . furosemide  40 mg Oral Daily  . ipratropium-albuterol  3 mL Nebulization TID  . isosorbide-hydrALAZINE  1 tablet Oral TID  . lactulose  20 g Oral TID  . multivitamin with minerals  1 tablet Oral Daily  . OLANZapine  2.5 mg Oral Daily  . senna  1 tablet Oral Daily  . sodium chloride flush  10-40 mL Intracatheter Q12H  . sodium chloride flush  10-40 mL Intracatheter Q12H  . Warfarin - Pharmacist Dosing Inpatient   Does not apply q1800   Continuous Infusions: . sodium chloride 250 mL (12/02/17 0700)     LOS: 19 days     Pleas Koch, MD [facility] ([phone]   If 7PM-7AM, please contact night-coverage [email or web address] Password [facility] 12/10/2017, 2:27 PM

## 2017-12-10 NOTE — Progress Notes (Signed)
ANTICOAGULATION CONSULT NOTE   Pharmacy Consult for Heparin and Coumadin Indication: new apical thrombus  Allergies  Allergen Reactions  . Banana     Sick   . Chlorhexidine Gluconate     Caused contact dermatitis on skin  . Egg Yolk Nausea And Vomiting  . Kiwi Extract Nausea And Vomiting   Patient Measurements: Height: 6\' 3"  (190.5 cm) Weight: 284 lb 13.4 oz (129.2 kg) IBW/kg (Calculated) : 84.5 Heparin Dosing Weight: 116 kg  Assessment: 62 y.o. male on heparin gtt for new mural thrombus noted on ECHO. Coumadin restarted on 2/27. INR today is therapeutic and up to 2.17. Last heparin level undetectable but lost IV acess this am. Heparin gtt restarted. Coumadin dose also not given last night for unknown reason. Hgb 11.3, plts up to 592.  Now day #5 of bridge with 2 therapeutic INRs. Will stop heparin today.  Goal of Therapy:  Heparin level 0.3-0.7 units/ml  INR 2.0-3.0 Monitor platelets by anticoagulation protocol: Yes   Plan:  Stop heparin gtt Give Coumadin 7.5mg  po x 1 this morning Monitor daily INR, CBC, s/s of bleed  Enzo Bi, PharmD, [facility] Clinical Pharmacist Pager [phone] 12/10/2017 10:11 AM

## 2017-12-11 DIAGNOSIS — R1310 Dysphagia, unspecified: Secondary | ICD-10-CM

## 2017-12-11 DIAGNOSIS — Z515 Encounter for palliative care: Secondary | ICD-10-CM

## 2017-12-11 LAB — GLUCOSE, CAPILLARY
GLUCOSE-CAPILLARY: 139 mg/dL — AB (ref 65–99)
GLUCOSE-CAPILLARY: 155 mg/dL — AB (ref 65–99)
GLUCOSE-CAPILLARY: 149 mg/dL — AB (ref 65–99)
Glucose-Capillary: 143 mg/dL — ABNORMAL HIGH (ref 65–99)
Glucose-Capillary: 149 mg/dL — ABNORMAL HIGH (ref 65–99)

## 2017-12-11 LAB — CBC WITH DIFFERENTIAL/PLATELET
BASOS PCT: 0 %
Basophils Absolute: 0 10*3/uL (ref 0.0–0.1)
EOS ABS: 0 10*3/uL (ref 0.0–0.7)
Eosinophils Relative: 0 %
HCT: 37.7 % — ABNORMAL LOW (ref 39.0–52.0)
HEMOGLOBIN: 11.5 g/dL — AB (ref 13.0–17.0)
LYMPHS PCT: 18 %
Lymphs Abs: 1.9 10*3/uL (ref 0.7–4.0)
MCH: 25.8 pg — ABNORMAL LOW (ref 26.0–34.0)
MCHC: 30.5 g/dL (ref 30.0–36.0)
MCV: 84.5 fL (ref 78.0–100.0)
MONO ABS: 0.9 10*3/uL (ref 0.1–1.0)
Monocytes Relative: 9 %
Neutro Abs: 7.7 10*3/uL (ref 1.7–7.7)
Neutrophils Relative %: 73 %
PLATELETS: 597 10*3/uL — AB (ref 150–400)
RBC: 4.46 MIL/uL (ref 4.22–5.81)
RDW: 22.4 % — ABNORMAL HIGH (ref 11.5–15.5)
WBC: 10.5 10*3/uL (ref 4.0–10.5)

## 2017-12-11 LAB — COMPREHENSIVE METABOLIC PANEL
ALK PHOS: 123 U/L (ref 38–126)
ALT: 51 U/L (ref 17–63)
ANION GAP: 12 (ref 5–15)
AST: 45 U/L — ABNORMAL HIGH (ref 15–41)
Albumin: 2.8 g/dL — ABNORMAL LOW (ref 3.5–5.0)
BUN: 19 mg/dL (ref 6–20)
CALCIUM: 9.2 mg/dL (ref 8.9–10.3)
CO2: 29 mmol/L (ref 22–32)
CREATININE: 1.08 mg/dL (ref 0.61–1.24)
Chloride: 102 mmol/L (ref 101–111)
Glucose, Bld: 135 mg/dL — ABNORMAL HIGH (ref 65–99)
Potassium: 4.4 mmol/L (ref 3.5–5.1)
SODIUM: 143 mmol/L (ref 135–145)
TOTAL PROTEIN: 6.6 g/dL (ref 6.5–8.1)
Total Bilirubin: 2.3 mg/dL — ABNORMAL HIGH (ref 0.3–1.2)

## 2017-12-11 LAB — PROTIME-INR
INR: 3.73
Prothrombin Time: 36.7 seconds — ABNORMAL HIGH (ref 11.4–15.2)

## 2017-12-20 NOTE — Discharge Summary (Signed)
Death Summary  Todd Sullivan NWG:[id number] DOB: 20-Jul-1956 DOA: 01/03/2018  PCP: Leilani Able, MD  Admit date: 12/20/2017 Date of Death: January 09, 2018 Time of Death: 22:22 Notification: Leilani Able, MD notified of death of 01/09/18   History of present illness:  Todd Sullivan is a 62 y.o. male with a history of  CCM to [facility] transfer 12/06/17. 32 male COPD, asthma, DM,  CAD status post STEMI/stent 06/2017, HTN, CVA who presented via EMS with complaints of dyspnea and altered mental status. He apparently had not done well since his MI 3 months ago.  He had not taken his medications for 5 days PTA.  He was found after a fall out of bed. In ED, unable to protect airway and hence intubated.  He was also hypoglycemic and placed on D10 drip.  He was admitted to ICU by CCM.  Extubated 2/26.   Todd Sullivan presented with complaint of  Todd Sullivan did not improve after further management and was continuously encephalopathic with hypernatremia, ALF and AKI which seems to slowly get better HIs mentation didn't improve Long disucssions held with family-patient was felt to be gravely ill famly elected DNAR Patient expired subsequently  Final Diagnoses:  1.   NICM, Hypoxic resp failure   The results of significant diagnostics from this hospitalization (including imaging, microbiology, ancillary and laboratory) are listed below for reference.    Significant Diagnostic Studies: Ct Head Wo Contrast  Result Date: 11-Dec-2017 CLINICAL DATA:  Altered mental status, unexplained altered level of consciousness, history stroke, hypertension, COPD, CHF, asthma EXAM: CT HEAD WITHOUT CONTRAST TECHNIQUE: Contiguous axial images were obtained from the base of the skull through the vertex without intravenous contrast. Sagittal and coronal MPR images reconstructed from axial data set. COMPARISON:  None FINDINGS: Brain: Normal ventricular morphology. No midline shift or mass effect. Normal appearance  of brain parenchyma. No intracranial hemorrhage, mass lesion or evidence of acute infarction. No extra-axial fluid collections. Vascular: Atherosclerotic calcification of internal carotid arteries at skull base Skull: Intact Sinuses/Orbits: Clear Other: Endotracheal and nasogastric tubes noted at oral cavity IMPRESSION: No acute intracranial abnormalities. Electronically Signed   By: Ulyses Southward M.D.   On: 01/02/2018 08:19   Dg Chest Port 1 View  Result Date: 12/08/2017 CLINICAL DATA:  Shortness of Breath EXAM: PORTABLE CHEST 1 VIEW COMPARISON:  12/07/2017 FINDINGS: Right PICC line now courses into the left innominate vein. Cardiomegaly with vascular congestion. No visible significant effusions or acute bony abnormality. IMPRESSION: Cardiomegaly, vascular congestion. Right PICC line courses into the left innominate vein. Electronically Signed   By: Charlett Nose M.D.   On: 12/08/2017 01:28   Dg Chest Port 1 View  Result Date: 12/07/2017 CLINICAL DATA:  CHF.  History of asthma-COPD, previous CVA EXAM: PORTABLE CHEST 1 VIEW COMPARISON:  Portable chest x-ray of December 05, 2017 FINDINGS: The cardiac silhouette remains enlarged. The pulmonary vascularity is engorged. The interstitial markings remain increased. The left hemidiaphragm is largely obscured however. There is no large pleural effusion. The right-sided PICC line tip projects over the midportion of the SVC. IMPRESSION: CHF with mild pulmonary interstitial edema. Probable small left pleural effusion. Electronically Signed   By: David  [place] M.D.   On: 12/07/2017 07:52   Dg Chest Port 1 View  Result Date: 12/05/2017 CLINICAL DATA:  Status postextubation EXAM: PORTABLE CHEST 1 VIEW COMPARISON:  December 04, 2017 FINDINGS: Right jugular and right subclavian catheter tips are in the superior vena cava. Nasogastric tube and endotracheal tube have been removed. No pneumothorax.  There is a small left pleural effusion. There is mild bibasilar atelectatic  change. No frank edema or consolidation. There is cardiomegaly with a degree of pulmonary venous hypertension. IMPRESSION: There is a degree of pulmonary vascular congestion. Cardiomegaly is stable. Small left pleural effusion. Mild bibasilar atelectasis. No consolidation. Central catheter tips in superior vena cava. No pneumothorax. Electronically Signed   By: Bretta Bang III M.D.   On: 12/05/2017 07:31   Dg Chest Port 1 View  Result Date: 12/04/2017 CLINICAL DATA:  Intubated patient, SS positioning of endotracheal tube, respiratory failure, CHF, morbid obesity, current smoker. EXAM: PORTABLE CHEST 1 VIEW COMPARISON:  Portable chest x-ray of December 03, 2017 FINDINGS: The lungs are adequately inflated. The interstitial markings remain increased in the right mid and lower lung. There is subsegmental atelectasis at the left lung base with a trace of pleural fluid. The cardiac silhouette remains enlarged. The central pulmonary vascularity is prominent. The endotracheal tube tip lies 7.2 cm above the carina with the tip approximately 1 cm inferior to the inferior margin of the clavicular heads. The esophagogastric tube tip in proximal port project below the inferior margin of the image. The right PICC line tip projects over the distal third of the SVC. The right internal jugular venous catheter tip projects over the midportion of the SVC. IMPRESSION: Reasonable positioning of the support tubes. Persistent increased interstitial markings greatest on the right likely reflects edema. Cardiomegaly, left basilar atelectasis, and small left pleural effusion. Electronically Signed   By: David  [place] M.D.   On: 12/04/2017 07:43   Dg Chest Port 1 View  Result Date: 12/03/2017 CLINICAL DATA:  Pneumonia. EXAM: PORTABLE CHEST 1 VIEW COMPARISON:  December 01, 2017 FINDINGS: The feeding tube terminates below today's film, not well assessed. The ETT and right central line are in good position. A right PICC line  terminates in the SVC is well. Stable cardiomegaly. The hila and mediastinum are unchanged. No pneumothorax. Small left effusion and underlying opacity. Mild pulmonary venous congestion. IMPRESSION: 1. Support apparatus as above. 2. Cardiomegaly and mild pulmonary venous congestion. 3. Probable small effusion and associated atelectasis. Electronically Signed   By: Gerome Sam III M.D   On: 12/03/2017 07:02   Dg Chest Port 1 View  Result Date: 12/01/2017 CLINICAL DATA:  Hypoxia EXAM: PORTABLE CHEST 1 VIEW COMPARISON:  November 30, 2017 FINDINGS: Endotracheal tube tip is 3.7 cm above the carina. Nasogastric tube tip and side port are below the diaphragm. Central catheter tip is in the superior vena cava. No pneumothorax. There is atelectatic change in the lung bases. There is consolidation in the medial lung bases bilaterally, more on the left than on the right. There is a small left pleural effusion. There is cardiomegaly with pulmonary vascularity within normal limits. No adenopathy evident. No bone lesions. IMPRESSION: Tube and catheter positions as described without pneumothorax. Stable cardiomegaly. Airspace consolidation in the left lower lobe and to a lesser extent right lower lobe, particularly medially. There is a small left pleural effusion. There is bibasilar atelectasis. Electronically Signed   By: Bretta Bang III M.D.   On: 12/01/2017 07:18   Dg Chest Port 1 View  Result Date: 11/30/2017 CLINICAL DATA:  Pneumonia.  Shortness of breath. EXAM: PORTABLE CHEST 1 VIEW COMPARISON:  11/29/2017. FINDINGS: Endotracheal tube, NG tube, right IJ line in stable position. Stable cardiomegaly. Unchanged bibasilar infiltrates/edema. Small left pleural effusion. No pneumothorax. IMPRESSION: 1.  Lines and tubes in stable position. 2. Unchanged bibasilar infiltrates/edema. Small  left pleural effusion. 3.  Stable cardiomegaly. Electronically Signed   By: Maisie Fus  Register   On: 11/30/2017 06:21   Dg Chest  Port 1 View  Result Date: 11/29/2017 CLINICAL DATA:  Acute respiratory failure EXAM: PORTABLE CHEST 1 VIEW COMPARISON:  11/28/2017 FINDINGS: Cardiac shadow is enlarged. Endotracheal tube and nasogastric catheter are again seen satisfactory position. Bibasilar opacities are again seen somewhat greater on the left than the right. No new focal abnormality is noted. IMPRESSION: Stable bibasilar changes. Electronically Signed   By: Alcide Clever M.D.   On: 11/29/2017 07:11   Dg Chest Port 1 View  Result Date: 11/28/2017 CLINICAL DATA:  Pneumonia EXAM: PORTABLE CHEST 1 VIEW COMPARISON:  11/27/2017 FINDINGS: Support devices are stable. Cardiomegaly with vascular congestion and mild pulmonary edema. Possible small left effusion with more confluent left lower lobe opacity, likely atelectasis. IMPRESSION: Cardiomegaly with vascular congestion and mild edema, similar prior study. Left lower lobe atelectasis with small left effusion. Electronically Signed   By: Charlett Nose M.D.   On: 11/28/2017 07:50   Dg Chest Port 1 View  Result Date: 11/27/2017 CLINICAL DATA:  COPD EXAM: PORTABLE CHEST 1 VIEW COMPARISON:  11/25/2017 FINDINGS: Endotracheal tube is 5 cm above the carina. Right central line and NG tube are unchanged. Cardiomegaly. Mild vascular congestion. Improving right lower lobe airspace opacity. Continued left perihilar and lower lobe density and minimal right basilar opacity. Small left effusion. IMPRESSION: Improving aeration in the lungs, particularly in the right lower lobe. Continued left perihilar and bilateral lower lobe opacities which could reflect edema or infection. Small left effusion. Cardiomegaly, vascular congestion. Electronically Signed   By: Charlett Nose M.D.   On: 11/27/2017 08:29   Dg Chest Port 1 View  Result Date: 11/25/2017 CLINICAL DATA:  Endotracheal tube EXAM: PORTABLE CHEST 1 VIEW COMPARISON:  11/24/2017 FINDINGS: Endotracheal tube 3 cm above the carina. NG enters the stomach. Right  jugular central venous catheter tip in the SVC. Pulmonary vascular congestion unchanged. Bibasilar atelectasis and small effusions unchanged. IMPRESSION: Endotracheal tube in good position Pulmonary vascular congestion compatible with fluid overload unchanged Bibasilar atelectasis and small effusions unchanged. Electronically Signed   By: Marlan Palau M.D.   On: 11/25/2017 08:19   Dg Chest Port 1 View  Result Date: 11/24/2017 CLINICAL DATA:  Pneumonia EXAM: PORTABLE CHEST 1 VIEW COMPARISON:  11/23/2017 FINDINGS: Endotracheal tube in good position. Central venous catheter tip in the SVC. NG tube enters the stomach. Cardiac enlargement. Progressive vascular congestion and probable edema. Progression of bibasilar airspace disease which may be atelectasis or infiltrate. Small bilateral effusions IMPRESSION: Endotracheal tube remains in good position Progression of vascular congestion and probable congestive heart failure. Progression of bibasilar airspace disease and small effusions. Electronically Signed   By: Marlan Palau M.D.   On: 11/24/2017 06:55   Dg Chest Port 1 View  Result Date: 11/23/2017 CLINICAL DATA:  Pneumonia. EXAM: PORTABLE CHEST 1 VIEW COMPARISON:  11/22/2017. FINDINGS: Enlarged cardiomediastinal silhouette. Support tubes and lines are stable. LEFT base opacity unchanged, concern for pneumonia, with associated atelectasis. Early opacity at the RIGHT base is stable. IMPRESSION: Stable chest.  Concern for LEFT lower lobe consolidation/pneumonia. Electronically Signed   By: Elsie Stain M.D.   On: 11/23/2017 06:33   Dg Chest Port 1 View  Result Date: 11/22/2017 CLINICAL DATA:  Intubated. EXAM: PORTABLE CHEST 1 VIEW COMPARISON:  12-21-2017 FINDINGS: Endotracheal tube terminates 5 cm above the carina. Enteric tube courses into the stomach with tip not imaged. Right  jugular catheter tip is suboptimally visualized but is likely in the region of the cavoatrial junction. Dense left lung base  opacity has mildly worsened, and air bronchograms are noted. An underlying left pleural effusion is possible. The right lung remains grossly clear. No pneumothorax is identified. IMPRESSION: Mild worsening of left lower lobe consolidation concerning for pneumonia. Electronically Signed   By: Sebastian Ache M.D.   On: 11/22/2017 06:53   Dg Chest Portable 1 View  Result Date: 11/10/2017 CLINICAL DATA:  Status post central line placement. History of MI 3 months ago, CHF, COPD-asthma, diabetes. Previous CVA. EXAM: PORTABLE CHEST 1 VIEW COMPARISON:  Portable chest x-ray of November 11, 2017 FINDINGS: There has been placement of right internal jugular venous catheter. The tip of the catheter projects over the junction of the middle and distal thirds of the SVC. There no postprocedure pneumothorax. The right lung is adequately inflated and grossly clear. On the left there is persistent increased density in the mid and lower lung with obscuration of the hemidiaphragm. The cardiac silhouette is enlarged. The endotracheal tube tip projects 6.2 cm above the carina. The esophagogastric tube tip projects below the inferior margin of the image. IMPRESSION: No postprocedure complication following right internal jugular venous catheter placement. Stable enlargement of the cardiac silhouette without significant pulmonary edema. Persistent atelectasis or pneumonia in the left lower lobe slightly more conspicuous today. The other support tubes are in reasonable position. Electronically Signed   By: David  [place] M.D.   On: 11/30/2017 07:47   Dg Chest Portable 1 View  Result Date: 11/27/2017 CLINICAL DATA:  Acute onset of shortness of breath. Hypoglycemia. Altered mental status. EXAM: PORTABLE CHEST 1 VIEW COMPARISON:  Chest radiograph performed earlier today at 4:08 a.m. FINDINGS: The patient's endotracheal tube is seen ending 7 cm above the carina. An enteric tube is noted extending below the diaphragm. A small left pleural  effusion is noted. Mildly increased interstitial markings could reflect minimal interstitial edema. No pneumothorax is seen. The cardiomediastinal silhouette is borderline normal in size. No acute osseous abnormalities are identified. IMPRESSION: 1. Endotracheal tube seen ending 7 cm above the carina. 2. Small left pleural effusion. Mildly increased interstitial markings could reflect minimal interstitial edema. Electronically Signed   By: Roanna Raider M.D.   On: 11/13/2017 06:02   Dg Chest Portable 1 View  Result Date: 12/01/2017 CLINICAL DATA:  62 y/o  M; shortness of breath and hypoglycemia. EXAM: PORTABLE CHEST 1 VIEW COMPARISON:  07/07/2017 chest radiograph FINDINGS: Stable cardiomegaly given projection and technique. Large central pulmonary arteries may represent pulmonary artery hypertension. Pulmonary vascular congestion. No consolidation or effusion. Multilevel degenerative changes of the spine. IMPRESSION: Cardiomegaly and pulmonary vascular congestion. No focal consolidation. Large central pulmonary arteries may represent pulmonary artery hypertension. Electronically Signed   By: Mitzi Hansen M.D.   On: 11/19/2017 04:42   Dg Abd Portable 1v  Result Date: 12/03/2017 CLINICAL DATA:  OG tube placement EXAM: PORTABLE ABDOMEN - 1 VIEW COMPARISON:  None. FINDINGS: OG tube tip is in the distal stomach. IMPRESSION: OG tube tip in the distal stomach. Electronically Signed   By: Charlett Nose M.D.   On: 12/01/2017 19:07   [place] Ekg Site Rite  Result Date: 12/01/2017 If Site Rite image not attached, placement could not be confirmed due to current cardiac rhythm.  US Abdomen Limited Ruq  Result Date: 11/23/2017 CLINICAL DATA:  Elevated liver function tests. EXAM: ULTRASOUND ABDOMEN LIMITED RIGHT UPPER QUADRANT COMPARISON:  None. FINDINGS: Gallbladder: There  is mild gallbladder wall thickening. No cholelithiasis. No positive sonographic Murphy sign. No pericholecystic fluid. Common bile duct:  Diameter: 4 mm Liver: There is increased hepatic echogenicity. Incompletely visualized volume of ascites. Portal vein is patent on color Doppler imaging with normal direction of blood flow towards the liver. IMPRESSION: 1. No evidence of acute cholecystitis. 2. Increased hepatic parenchymal echogenicity and mild gallbladder wall thickening are likely secondary to underlying chronic liver disease. 3. Ascites. Electronically Signed   By: Deatra Robinson M.D.   On: 11/23/2017 17:50    Microbiology: No results found for this or any previous visit (from the past 240 hour(s)).   Labs: Basic Metabolic Panel: No results for input(s): NA, K, CL, CO2, GLUCOSE, BUN, CREATININE, CALCIUM, MG, PHOS in the last 168 hours. Liver Function Tests: No results for input(s): AST, ALT, ALKPHOS, BILITOT, PROT, ALBUMIN in the last 168 hours. No results for input(s): LIPASE, AMYLASE in the last 168 hours. No results for input(s): AMMONIA in the last 168 hours. CBC: No results for input(s): WBC, NEUTROABS, HGB, HCT, MCV, PLT in the last 168 hours. Cardiac Enzymes: No results for input(s): CKTOTAL, CKMB, CKMBINDEX, TROPONINI in the last 168 hours. D-Dimer No results for input(s): DDIMER in the last 72 hours. BNP: Invalid input(s): POCBNP CBG: No results for input(s): GLUCAP in the last 168 hours. Anemia work up No results for input(s): VITAMINB12, FOLATE, FERRITIN, TIBC, IRON, RETICCTPCT in the last 72 hours. Urinalysis    Component Value Date/Time   COLORURINE AMBER (A) 09-Dec-2017 0605   APPEARANCEUR CLOUDY (A) 12-09-2017 0605   LABSPEC 1.017 December 09, 2017 0605   PHURINE 5.0 12/09/17 0605   GLUCOSEU NEGATIVE December 09, 2017 0605   HGBUR LARGE (A) 12-09-2017 0605   BILIRUBINUR SMALL (A) 12-09-17 0605   KETONESUR NEGATIVE 12/09/17 0605   PROTEINUR 100 (A) 12-09-17 0605   UROBILINOGEN 0.2 02/16/2013 1505   NITRITE NEGATIVE 12/09/17 0605   LEUKOCYTESUR NEGATIVE 2017/12/09 0605   Sepsis Labs Invalid  input(s): PROCALCITONIN,  WBC,  LACTICIDVEN     SIGNED:  Rhetta Mura, MD  [facility] 12/20/2017, 3:31 PM Pager   If 7PM-7AM, please contact night-coverage [email or web address] Password [email or web address]

## 2018-01-08 NOTE — Progress Notes (Signed)
@  2202 Called to room by fiancee, noted pt not breathing and pulseless verified by another nurse. @2210  Informed on call K.Schorr,NP that pt passed away.@2230  [place] donor notified pt not an organ, tissue or eye donor. @2235  Called Medical Examiner(Tim Mcneill) and pt is not an ME case. Son who is POA(Shariff Sherryll Burger) is at bedside.

## 2018-01-08 NOTE — Progress Notes (Signed)
Palliative:  I am following up on Todd Sullivan from my colleague over the weekend. I was able to find an electronic copy of a Living Will created and notarized 07/07/17 naming his fiance Todd Sullivan as his HCPOA and his son, Todd Sullivan, as his secondary HCPOA. I did print a copy of this and provide to Todd Sullivan as she is at his bedside today. I do not believe she or any other family know this document exists.   I proceeded with GOC today with Todd and she shares that they would like to take him home with hospice. We discussed what this would look like and his care needs and responsibility this would place on her and any other family that will be assisting. We discussed concern with aspiration as he appears to be struggling with some secretions today and too weak to clear. Discussed hospice assistance for decline at home and goals to keep him comfortable and happy. Discussed transition to hospice facility if care becomes too much at home (I believe that he will need this). Emotional support provided.   35 min  Todd Channel, NP Palliative Medicine Team Pager # [phone] (M-F 8a-5p) Team Phone # [phone] (Nights/Weekends)

## 2018-01-08 NOTE — Social Work (Signed)
CSW continuing to follow to support disposition plan, aware that conversation is currently: SNF vs. Home with Hospice/Palliative support.   Doy Hutching, LCSWA [facility] Clinical Social Work [phone]

## 2018-01-08 NOTE — Progress Notes (Signed)
Physical Therapy Treatment Patient Details Name: Todd Sullivan MRN: [id number] DOB: 15-Jul-1956 Today's Date: 12/31/2017    History of Present Illness 62 y.o. male admitted on December 17, 2017 for SOB.  He was intubated in the ED and extubated on 12/04/17.  At the time of PT evaluation he was on 10-15 L of O2 via HFNC.  Pt dx with probably L LL CAP, and toxic metabolic encepholopathy.  Pt with pulmonary edema and pleural effusion started on lasix.  Other significant PMH includes stroke, HTN, gout, DM, COPD, CHF, asthma, anxiety,  MI s/p revasularization, and EF of 10%.      PT Comments    This session focused on bed mobility and sitting balance EOB. Pt required mod/max A +2 for all mobility. Pt followed one step commands inconsistently and mostly communicated nonverbally. Pt attempted to speak during session however mostly unintelligible but able to mouth certain things like saying thank you end of session. Pt is making progress with sitting balance and next session may be able to work on sit to stand transfers. Continue to progress as tolerated.   Follow Up Recommendations  Supervision/Assistance - 24 hour;SNF     Equipment Recommendations  Rolling walker with 5" wheels;Wheelchair (measurements PT);Wheelchair cushion (measurements PT);Hospital bed    Recommendations for Other Services Rehab consult;OT consult     Precautions / Restrictions Precautions Precautions: Fall    Mobility  Bed Mobility Overal bed mobility: Needs Assistance Bed Mobility: Supine to Sit;Sit to Supine;Rolling Rolling: Mod assist;+2 for safety/equipment   Supine to sit: Max assist;+2 for physical assistance Sit to supine: Total assist;+2 for physical assistance   General bed mobility comments: cues for sequencing and technique; pt assisted with bilat LE and L UE;   Transfers                 General transfer comment: deferred today given pt kept closing eyes EOB  Ambulation/Gait                  Stairs            Wheelchair Mobility    Modified Rankin (Stroke Patients Only)       Balance Overall balance assessment: Needs assistance Sitting-balance support: Feet supported;Bilateral upper extremity supported Sitting balance-Leahy Scale: Poor Sitting balance - Comments: pt able to maintain sitting balance EOB with min guard/min A and cues for posture and bilat UE support                                    Cognition Arousal/Alertness: Suspect due to medications(drowsy at times) Behavior During Therapy: Restless Overall Cognitive Status: Impaired/Different from baseline Area of Impairment: Attention;Following commands;Safety/judgement;Awareness;Problem solving                   Current Attention Level: Focused   Following Commands: Follows one step commands inconsistently;Follows one step commands with increased time Safety/Judgement: Decreased awareness of safety;Decreased awareness of deficits Awareness: Intellectual Problem Solving: Slow processing;Decreased initiation;Requires verbal cues;Difficulty sequencing;Requires tactile cues General Comments: pt did not verbalize much during session but did attempt to speak at times but mostly unintelligible; pt mouthed thank you end of session and yelled out profanity once when being repositioned      Exercises      General Comments General comments (skin integrity, edema, etc.): fiance and Palliative care team member present       Pertinent Vitals/Pain Pain Assessment: Faces Faces  Pain Scale: Hurts even more Pain Location: grimacing with some movements; appears that R knee is painful with flexion Pain Descriptors / Indicators: Grimacing Pain Intervention(s): Limited activity within patient's tolerance;Monitored during session;Premedicated before session;Repositioned    Home Living                      Prior Function            PT Goals (current goals can now be found in  the care plan section) Acute Rehab PT Goals PT Goal Formulation: With patient/family Time For Goal Achievement: 12/19/17 Potential to Achieve Goals: Good Progress towards PT goals: Progressing toward goals    Frequency    Min 3X/week      PT Plan Current plan remains appropriate    Co-evaluation              AM-PAC PT "6 Clicks" Daily Activity  Outcome Measure  Difficulty turning over in bed (including adjusting bedclothes, sheets and blankets)?: Unable Difficulty moving from lying on back to sitting on the side of the bed? : Unable Difficulty sitting down on and standing up from a chair with arms (e.g., wheelchair, bedside commode, etc,.)?: Unable Help needed moving to and from a bed to chair (including a wheelchair)?: Total Help needed walking in hospital room?: Total Help needed climbing 3-5 steps with a railing? : Total 6 Click Score: 6    End of Session Equipment Utilized During Treatment: Oxygen Activity Tolerance: Patient limited by fatigue Patient left: with bed alarm set;with family/visitor present;in bed;with call bell/phone within reach Nurse Communication: Mobility status PT Visit Diagnosis: Muscle weakness (generalized) (M62.81);Difficulty in walking, not elsewhere classified (R26.2)     Time: 1610-9604 PT Time Calculation (min) (ACUTE ONLY): 39 min  Charges:  $Therapeutic Activity: 38-52 mins                    G Codes:       Erline Levine, PTA Pager: [phone]     Carolynne Edouard 12/20/2017, 4:25 PM

## 2018-01-08 NOTE — Progress Notes (Signed)
Chaplain first visited Todd Sullivan on Friday 01 March. The Pt. At that time was not in a cognitive space to engage conversation. Chaplain went by pt.'s room today and I am feeling that this process will need to be executed when son is present. Chaplain will connect with [name] of the ethics committee for guidance on completing this AD.  Chaplain Todd Sullivan will ask the staff to look out for son and notify me when the son is at bed side.

## 2018-01-08 NOTE — Progress Notes (Signed)
PROGRESS NOTE   Todd Sullivan  ZOX:[id number]    DOB: 06-07-1956    DOA: 12/01/2017  PCP: Leilani Able, MD   I have briefly reviewed patients previous medical records in [facility].  Brief Narrative:  CCM to [facility] transfer 12/06/17. 39 male COPD, asthma, DM,  CAD status post STEMI/stent 06/2017, HTN, CVA who presented via EMS with complaints of dyspnea and altered mental status. He apparently had not done well since his MI 3 months ago.  He had not taken his medications for 5 days PTA.  He was found after a fall out of bed. In ED, unable to protect airway and hence intubated.  He was also hypoglycemic and placed on D10 drip.  He was admitted to ICU by CCM.  Extubated 2/26.   Assessment & Plan:   Active Problems:   Acute decompensated heart failure (HCC)   Acute respiratory failure (HCC)   Respiratory failure (HCC)   Altered mental status   Pressure injury of skin   Goals of care, counseling/discussion   Palliative care by specialist  Acute respiratory failure secondary to pneumonia, pulmonary edema-intubated on admission 2/12, extubated 2/26, continue high flow nasal oxygen 4 L  Left lower lobe pneumonia on admission-completed antibiotics 2/18, tracheal aspirate showed no findings  Nonischemic cardiomyopathy EF 10-15% 2/14 Cardiology saw patient placed on Coreg 3.125 twice daily captopril 6.25 3 times daily and BiDil Had prior MI 07/06/2017 and angioplasty continue Plavix   Mural thrombus:   Initiated Coumadin per pharmacy.  Minimal hematuria noted, monitor closely. INR therapeutic 3.7heparin d/c 3/3  Obesity/Body mass index is 34.87 kg/m.  Acute toxic metabolic encephalopathy: Unclear as to his baseline mental status.  CT head 12/04/2017: No acute intracranial abnormalities.  ABG 12/04/17 without CO2 retention.  No focal deficits on clinical exam. Minimized sedative medications and monitor closely.  Recent AKI and current hypernatremia may be contributing.  As per  discussion with CCM 2/27, patient was somnolent with waxing and waning alertness even 2/26 and this does not appear to be new.  Starting Zyprexa 2.5 daily for metabolic encephalopathy with some hallucinations He is still not clear but following some commands-ammonia 81 Starting lactulose 3/3 Has not improved depsite lactulose-Await re-engagement with son to discuss goals of care and EOL care as will need disposition plan as per my discussion with him 3/3  Acute renal failure: Presumed cardiorenal syndrome.  Resolved.  Hypernatremia: Unclear etiology.  Initially in the 150 range and now better with IV Lasix and currently is on Lasix 40 mg daily  Transaminitis:?  Related to hepatic congestion.  Hepatitis panel negative.  RUQ ultrasound without evidence of acute cholecystitis.  Suspected shock liver.  Much improved. Rpt LFT's in 3/2 were negative and felt secondary to shock liver  Constipation:   Continue bowel regimen.  Type II DM: A1c 6.5.  On SSI.  Controlled 130-150  Cocaine abuse: UDS positive for cocaine-Uses Crack at home, unclear if this has affected mentation long term  Normocytic anemia: Relatively stable.  Follow CBCs closely  Dysphagia: On a modified diet as per speech therapy.  Adult failure to thrive: Multifactorial.  Patient very ill and has poor overall prognosis.  Palliative care consulted for goals of care.   DVT prophylaxis:   Coumadin per pharmacy initiated. Code Status: DNR.  Confirmed with CCM MD. Family Communication: No family at the bedside currently Disposition: To be determined.   Consultants:  CCM-signed off 2/26 Cardiology Rehab MD  Procedures:  Foley catheter RUE PICC  line Extubated 2/26  Antimicrobials:  Completed course   Subjective:  Confused no signif improvement Follows some commands but otherwsie cannot verbalise well   Objective:  Vitals:   12/10/17 1352 12/10/17 2013 01/06/2018 0412 12/14/2017 1300  BP: 130/72 98/65 126/68 (!)  166/101  Pulse: 100 95 98 94  Resp: 17 (!) 24 (!) 22 20  Temp: 98.2 F (36.8 C) 98.5 F (36.9 C) 98.2 F (36.8 C) 98.2 F (36.8 C)  TempSrc: Oral   Axillary  SpO2: 92% 93% 94% 99%  Weight:   126.6 kg (279 lb)   Height:        Examination:  Unkempt confused  S1-S2 no murmur rub or gallop Abdomen soft nontender no rebound Chest clinically clear no rales  Lower extremities seem to have darkening of the lower extremity skin-no le wound Sacral decubitus appears clean Foley in place .Marland Kitchen   Data Reviewed: I have personally reviewed following labs and imaging studies  CBC: Recent Labs  Lab 12/07/17 0450 12/08/17 0737 12/09/17 0306 12/10/17 0758 12/20/2017 0900  WBC 9.6 10.1 10.3 10.9* 10.5  NEUTROABS  --   --   --   --  7.7  HGB 11.8* 11.5* 11.7* 11.3* 11.5*  HCT 38.6* 37.9* 37.9* 35.9* 37.7*  MCV 84.8 84.4 82.9 82.5 84.5  PLT 467* 497* 559* 592* 597*   Basic Metabolic Panel: Recent Labs  Lab 12/05/17 0428 12/06/17 0334 12/07/17 0450 12/08/17 0737 12/09/17 0306 12/16/2017 0900  NA 149* 149* 148* 144 142 143  K 3.3* 4.3 3.8 3.4* 3.5 4.4  CL 108 109 108 105 103 102  CO2 28 29 29 28 28 29   GLUCOSE 87 101* 114* 130* 106* 135*  BUN 55* 43* 28* 21* 17 19  CREATININE 1.17 1.16 1.05 1.00 1.05 1.08  CALCIUM 8.9 8.9 9.1 9.0 9.1 9.2  MG 1.9 1.9  --   --   --   --   PHOS 3.7 3.4  --   --   --   --    Liver Function Tests: Recent Labs  Lab 12/07/17 0450 12/09/17 0306 12/29/2017 0900  AST 28 32 45*  ALT 56 45 51  ALKPHOS 82 85 123  BILITOT 1.9* 1.9* 2.3*  PROT 6.7 6.7 6.6  ALBUMIN 2.8* 2.8* 2.8*   Coagulation Profile: Recent Labs  Lab 12/07/17 0450 12/08/17 0737 12/09/17 0306 12/10/17 0758 12/10/2017 0900  INR 1.56 1.84 2.08 2.17 3.73   Cardiac Enzymes: No results for input(s): CKTOTAL, CKMB, CKMBINDEX, TROPONINI in the last 168 hours. HbA1C: No results for input(s): HGBA1C in the last 72 hours. CBG: Recent Labs  Lab 12/10/17 1602 12/10/17 2010  12/08/2017 0407 12/12/2017 0757 12/18/2017 1158  GLUCAP 83 107* 139* 155* 143*    Recent Results (from the past 240 hour(s))  Culture, respiratory (NON-Expectorated)     Status: None   Collection Time: 12/03/17  1:35 PM  Result Value Ref Range Status   Specimen Description TRACHEAL ASPIRATE  Final   Special Requests NONE  Final   Gram Stain   Final    RARE WBC PRESENT, PREDOMINANTLY MONONUCLEAR RARE SQUAMOUS EPITHELIAL CELLS PRESENT NO ORGANISMS SEEN    Culture   Final    FEW Consistent with normal respiratory flora. Performed at [facility], [address] [place], [place] [address]    Report Status 12/06/2017 FINAL  Final    Radiology Studies: No results found. Scheduled Meds: . captopril  6.25 mg Oral TID  . carvedilol  3.125 mg Oral BID WC  . clopidogrel  75 mg Oral Daily  . docusate sodium  100 mg Oral Daily  . furosemide  40 mg Oral Daily  . isosorbide-hydrALAZINE  1 tablet Oral TID  . lactulose  20 g Oral TID  . multivitamin with minerals  1 tablet Oral Daily  . OLANZapine  2.5 mg Oral Daily  . senna  1 tablet Oral Daily  . Warfarin - Pharmacist Dosing Inpatient   Does not apply q1800   Continuous Infusions: . sodium chloride 250 mL (12/02/17 0700)     LOS: 20 days   15 min   Pleas Koch, MD [facility] ([facility]   If 7PM-7AM, please contact night-coverage [email or web address] Password [facility] 12/10/2017, 2:43 PM

## 2018-01-08 NOTE — Social Work (Signed)
CSW aware that [facility] has declined pt, SNF recommended.   CSW to follow.   Doy Hutching, LCSWA [facility] Clinical Social Work [phone]

## 2018-01-08 NOTE — Progress Notes (Signed)
  Speech Language Pathology Treatment: Dysphagia  Patient Details Name: Todd Sullivan MRN: [id number] DOB: Dec 24, 1955 Today's Date: 12/31/2017 Time: 8144-8185 SLP Time Calculation (min) (ACUTE ONLY): 31 min  Assessment / Plan / Recommendation Clinical Impression  Pt is more lethargic, confused, and audibly wet than during last SLP visit. His significant other says that he has been drowsier today but that he also had recently completed PT and he is particularly worn out at the moment. He is mouthing words to me intermittently but not phonating as he was before. His fiance is concerned about his ability to communicate and asked to try a communication board. SLP attempted to use a simple binary field to ask yes/no questions but he was not able to attend to task long enough to identify between the two fields.   Pt is audible wet at baseline and needs Mod cues to elicit a volitional cough, which is productive of mild-moderate secretions. He has immediate coughing with thin and nectar thick liquids, despite Mod A from SLP to take small sips - even with administration from teaspoon. Although clinical assessment is limited by his overall pulmonary function, I do worry about aspiration given that his cough does not seem as strong and generally he seems weaker given that he cannot help me self-feed today.   NT does share that he had no overt difficulty with intake this morning for breakfast (he declined lunch), therefore would keep current diet order in but would hold off on the water protocol. SLP provided thorough education to NT and fiance about precautions and signs of aspiration. SLP also educated his fiance about current diet textures, as she said she had been giving him smoothies because she thought they were thicker liquids, not realizing that they melt down in his mouth. SLP will continue to monitor closely.   HPI HPI: 62 year old male with past medical history significant for COPD, asthma, diabetes,  CAD status post STEMI/ stent 06/2017, hypertension, and CVA who presented via EMS with complaints of shortness of breath and altered mental status on 2/12. Intubated on arrival secondary to inability to protect airway. Extubated 2/25 upon pt wishes. CXR improving from admission, now showing There is a degree of pulmonary vascular congestion. Cardiomegaly is stable. Small left pleural effusion. Mild bibasilar atelectasis. No consolidation. Central catheter tips in superior vena cava. No pneumothorax.       SLP Plan  Continue with current plan of care       Recommendations  Diet recommendations: Dysphagia 1 (puree);Nectar-thick liquid(only when maximally alert, not coughing) Liquids provided via: Cup;Straw;Teaspoon Medication Administration: Whole meds with puree Supervision: Staff to assist with self feeding;Full supervision/cueing for compensatory strategies Compensations: Slow rate;Small sips/bites;Minimize environmental distractions Postural Changes and/or Swallow Maneuvers: Seated upright 90 degrees                Oral Care Recommendations: Oral care BID Follow up Recommendations: Skilled Nursing facility SLP Visit Diagnosis: Dysphagia, oropharyngeal phase (R13.12) Plan: Continue with current plan of care       GO                Maxcine Ham 12/28/2017, 4:36 PM  Maxcine Ham, M.A. CCC-SLP [phone]

## 2018-01-08 NOTE — Progress Notes (Signed)
ANTICOAGULATION CONSULT NOTE - Follow Up Consult  Pharmacy Consult for Coumadin Indication: Mural thrombus  Allergies  Allergen Reactions  . Banana     Sick   . Chlorhexidine Gluconate     Caused contact dermatitis on skin  . Egg Yolk Nausea And Vomiting  . Kiwi Extract Nausea And Vomiting    Patient Measurements: Height: 6\' 3"  (190.5 cm) Weight: 279 lb (126.6 kg) IBW/kg (Calculated) : 84.5 Heparin Dosing Weight:    Vital Signs: Temp: 98.2 F (36.8 C) (03/04 0412) BP: 126/68 (03/04 0412) Pulse Rate: 98 (03/04 0412)  Labs: Recent Labs    12/09/17 0306 12/09/17 1810 12/10/17 0758 01-06-18 0900  HGB 11.7*  --  11.3*  --   HCT 37.9*  --  35.9*  --   PLT 559*  --  592*  --   LABPROT 23.2*  --  24.0* 36.7*  INR 2.08  --  2.17 3.73  HEPARINUNFRC 0.22* 0.33 <0.10*  --   CREATININE 1.05  --   --  1.08    Estimated Creatinine Clearance: 104.2 mL/min (by C-G formula based on SCr of 1.08 mg/dL).   Assessment:  Anticoag: Was on heparin gtt for new mural thrombus noted on ECHO. Coumadin restarted on 2/27. Coumadin dose not "charted" 3/2 for unknown reason. It may have still been given because INR 2.17>3.73 today.  Goal of Therapy:  INR 2-3 Monitor platelets by anticoagulation protocol: Yes   Plan:  Hold Coumadin tonight. Daily INR   Todd Sullivan S. Merilynn Finland, PharmD, BCPS Clinical Staff Pharmacist Pager [phone]  Misty Stanley Stillinger 2018/01/06,11:31 AM

## 2018-01-08 NOTE — Progress Notes (Signed)
Pt expired tonight at 2022 pronounced by 2 RNs. Pt was a DNR. Death certificate completed and given to [place].  KJKG, NP [facility]

## 2018-01-08 DEATH — deceased

## 2020-01-18 IMAGING — DX DG CHEST 1V PORT
1 series · 2 of 2 positions shown · non-contrast
Comparison: December 01, 2017

CLINICAL DATA: Pneumonia.

EXAM:
PORTABLE CHEST 1 VIEW

[Series 1: chest ap · 0.14mm/px · 2 of 2 slices shown]
[im 1/2]
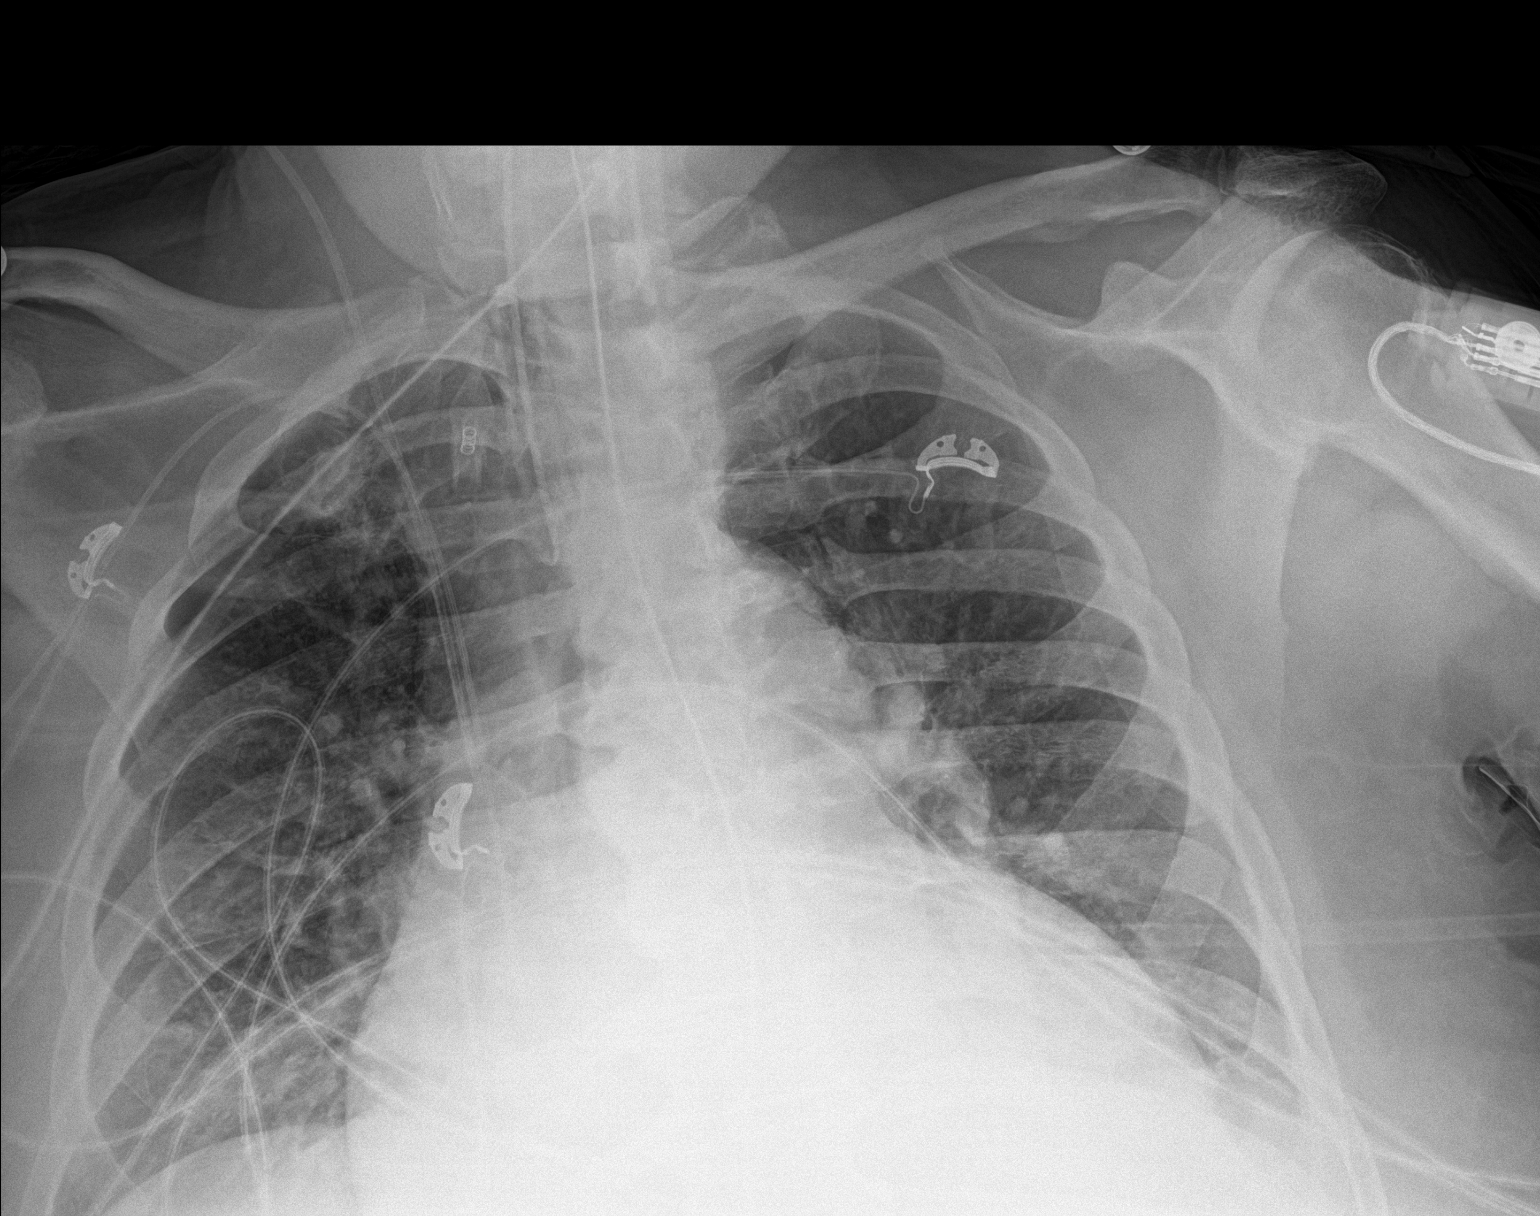
[im 2/2]
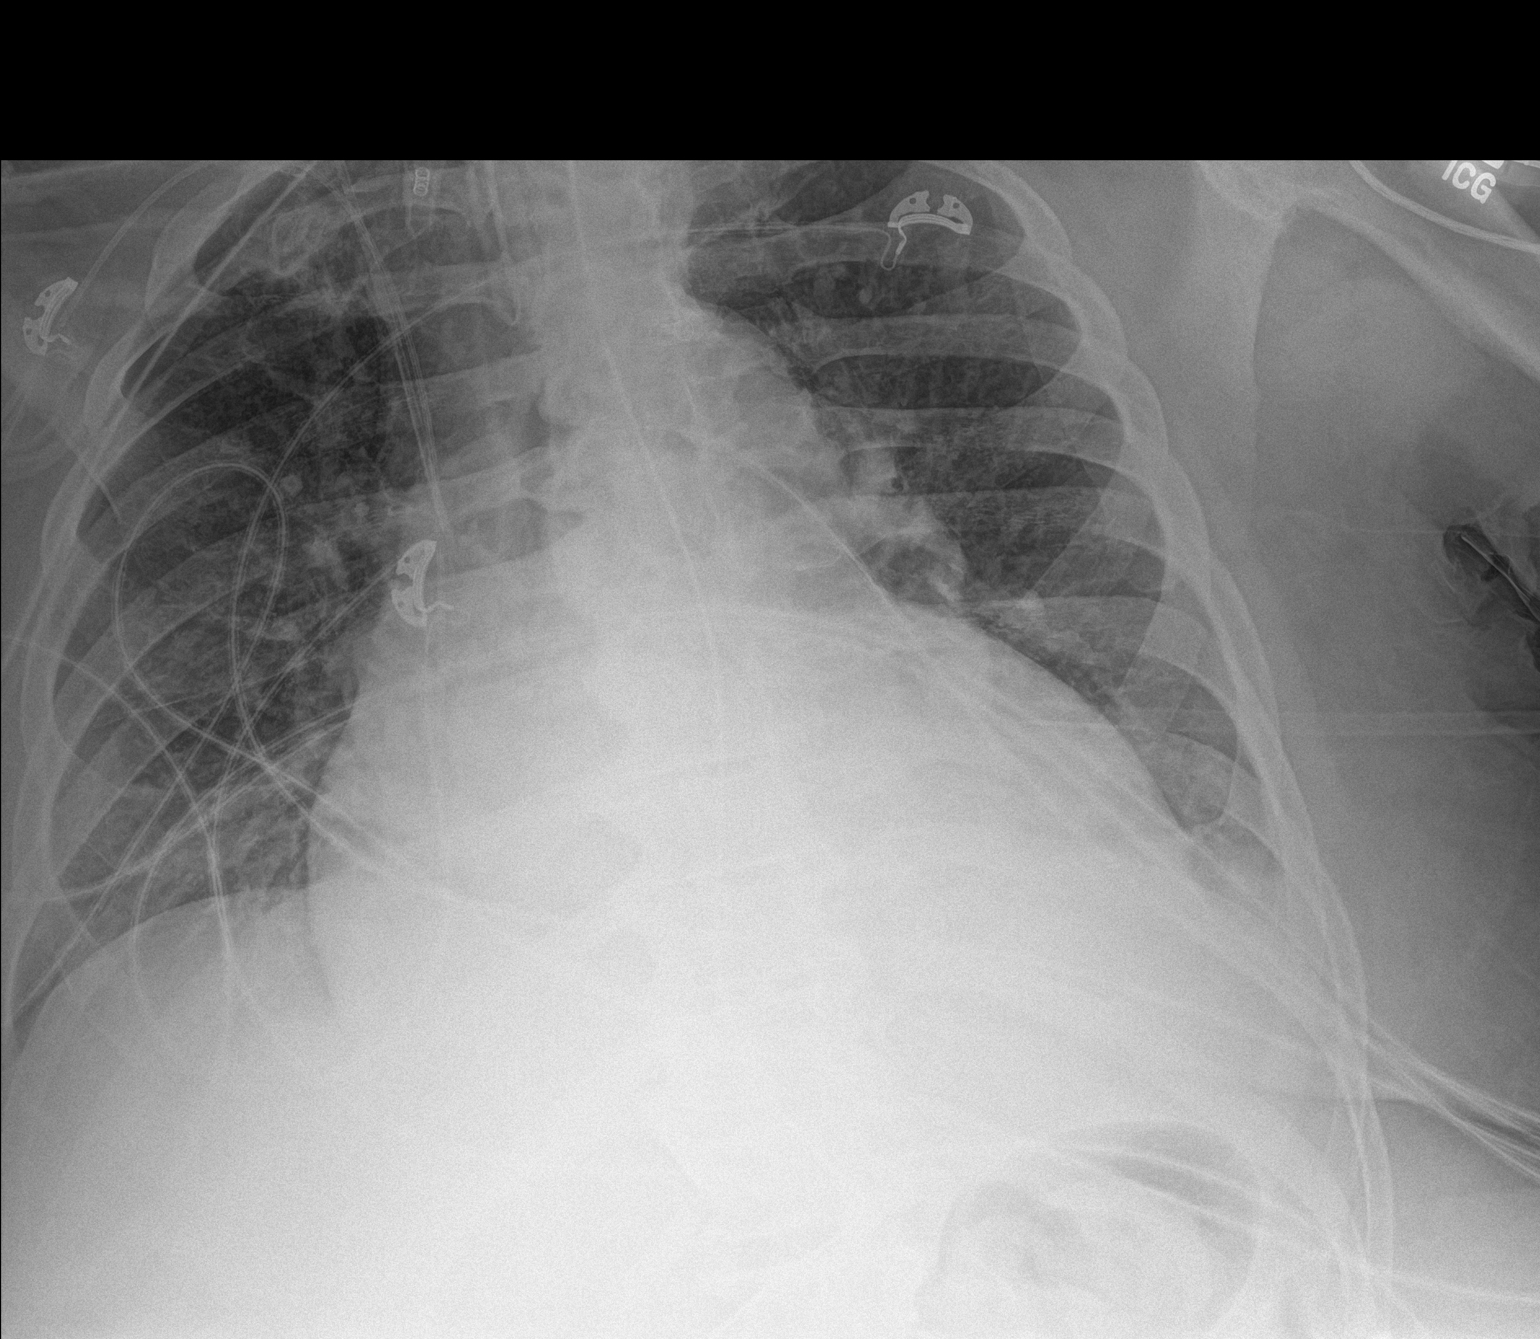

[2 of 2 positions shown; findings below may reference images not displayed]

FINDINGS: The feeding tube terminates below today's film, not well assessed.
The ETT and right central line are in good position. A right PICC
line terminates in the SVC is well. Stable cardiomegaly. The hila
and mediastinum are unchanged. No pneumothorax. Small left effusion
and underlying opacity. Mild pulmonary venous congestion.
IMPRESSION: 1. Support apparatus as above.
2. Cardiomegaly and mild pulmonary venous congestion.
3. Probable small effusion and associated atelectasis.
